# Patient Record
Sex: Female | Born: 1975
Health system: Southern US, Community
[De-identification: ages and names within clinical notes are randomized; demographics above are authoritative.]

## PROBLEM LIST (undated history)

## (undated) DIAGNOSIS — K5900 Constipation, unspecified: Secondary | ICD-10-CM

## (undated) DIAGNOSIS — R0602 Shortness of breath: Secondary | ICD-10-CM

## (undated) DIAGNOSIS — R053 Chronic cough: Secondary | ICD-10-CM

## (undated) DIAGNOSIS — R002 Palpitations: Secondary | ICD-10-CM

## (undated) DIAGNOSIS — R600 Localized edema: Secondary | ICD-10-CM

## (undated) DIAGNOSIS — I10 Essential (primary) hypertension: Secondary | ICD-10-CM

## (undated) DIAGNOSIS — M2550 Pain in unspecified joint: Secondary | ICD-10-CM

## (undated) DIAGNOSIS — E785 Hyperlipidemia, unspecified: Secondary | ICD-10-CM

## (undated) DIAGNOSIS — T7840XA Allergy, unspecified, initial encounter: Secondary | ICD-10-CM

## (undated) HISTORY — DX: Essential (primary) hypertension: I10

## (undated) HISTORY — PX: COLON SURGERY: SHX602

## (undated) HISTORY — DX: Allergy, unspecified, initial encounter: T78.40XA

## (undated) HISTORY — DX: Chronic cough: R05.3

## (undated) HISTORY — PX: WISDOM TOOTH EXTRACTION: SHX21

## (undated) HISTORY — DX: Pain in unspecified joint: M25.50

## (undated) HISTORY — PX: EYE SURGERY: SHX253

## (undated) HISTORY — DX: Palpitations: R00.2

## (undated) HISTORY — DX: Localized edema: R60.0

## (undated) HISTORY — DX: Constipation, unspecified: K59.00

## (undated) HISTORY — DX: Shortness of breath: R06.02

## (undated) HISTORY — DX: Hyperlipidemia, unspecified: E78.5

---

## 2008-11-17 DIAGNOSIS — G43909 Migraine, unspecified, not intractable, without status migrainosus: Secondary | ICD-10-CM | POA: Insufficient documentation

## 2012-07-31 DIAGNOSIS — K219 Gastro-esophageal reflux disease without esophagitis: Secondary | ICD-10-CM | POA: Insufficient documentation

## 2013-03-10 DIAGNOSIS — I10 Essential (primary) hypertension: Secondary | ICD-10-CM | POA: Insufficient documentation

## 2013-09-17 DIAGNOSIS — R454 Irritability and anger: Secondary | ICD-10-CM | POA: Insufficient documentation

## 2015-07-14 DIAGNOSIS — K219 Gastro-esophageal reflux disease without esophagitis: Secondary | ICD-10-CM | POA: Insufficient documentation

## 2015-07-14 DIAGNOSIS — Z8709 Personal history of other diseases of the respiratory system: Secondary | ICD-10-CM | POA: Insufficient documentation

## 2016-11-07 DIAGNOSIS — R928 Other abnormal and inconclusive findings on diagnostic imaging of breast: Secondary | ICD-10-CM | POA: Insufficient documentation

## 2018-06-24 DIAGNOSIS — H6123 Impacted cerumen, bilateral: Secondary | ICD-10-CM | POA: Diagnosis not present

## 2018-06-24 DIAGNOSIS — Z6831 Body mass index (BMI) 31.0-31.9, adult: Secondary | ICD-10-CM | POA: Diagnosis not present

## 2018-07-29 DIAGNOSIS — I10 Essential (primary) hypertension: Secondary | ICD-10-CM | POA: Diagnosis not present

## 2018-07-29 DIAGNOSIS — R454 Irritability and anger: Secondary | ICD-10-CM | POA: Diagnosis not present

## 2018-07-29 DIAGNOSIS — Z0000 Encounter for general adult medical examination without abnormal findings: Secondary | ICD-10-CM | POA: Diagnosis not present

## 2018-07-29 DIAGNOSIS — G43809 Other migraine, not intractable, without status migrainosus: Secondary | ICD-10-CM | POA: Diagnosis not present

## 2018-08-12 DIAGNOSIS — Z23 Encounter for immunization: Secondary | ICD-10-CM | POA: Diagnosis not present

## 2018-12-28 DIAGNOSIS — J019 Acute sinusitis, unspecified: Secondary | ICD-10-CM | POA: Diagnosis not present

## 2019-11-06 HISTORY — PX: LASIK: SHX215

## 2019-11-23 DIAGNOSIS — Z20822 Contact with and (suspected) exposure to covid-19: Secondary | ICD-10-CM | POA: Diagnosis not present

## 2019-12-19 DIAGNOSIS — Z20822 Contact with and (suspected) exposure to covid-19: Secondary | ICD-10-CM | POA: Diagnosis not present

## 2019-12-29 ENCOUNTER — Encounter: Payer: Self-pay | Admitting: Family Medicine

## 2019-12-29 ENCOUNTER — Other Ambulatory Visit: Payer: Self-pay

## 2019-12-29 ENCOUNTER — Ambulatory Visit (INDEPENDENT_AMBULATORY_CARE_PROVIDER_SITE_OTHER): Payer: BC Managed Care – PPO | Admitting: Family Medicine

## 2019-12-29 VITALS — BP 142/84 | HR 87 | Temp 98.6°F | Ht 64.0 in | Wt 185.4 lb

## 2019-12-29 DIAGNOSIS — G43829 Menstrual migraine, not intractable, without status migrainosus: Secondary | ICD-10-CM

## 2019-12-29 DIAGNOSIS — R454 Irritability and anger: Secondary | ICD-10-CM

## 2019-12-29 DIAGNOSIS — R05 Cough: Secondary | ICD-10-CM | POA: Diagnosis not present

## 2019-12-29 DIAGNOSIS — Z7689 Persons encountering health services in other specified circumstances: Secondary | ICD-10-CM

## 2019-12-29 DIAGNOSIS — R4586 Emotional lability: Secondary | ICD-10-CM

## 2019-12-29 DIAGNOSIS — Z8669 Personal history of other diseases of the nervous system and sense organs: Secondary | ICD-10-CM | POA: Insufficient documentation

## 2019-12-29 DIAGNOSIS — G8929 Other chronic pain: Secondary | ICD-10-CM | POA: Insufficient documentation

## 2019-12-29 DIAGNOSIS — R053 Chronic cough: Secondary | ICD-10-CM

## 2019-12-29 DIAGNOSIS — M2142 Flat foot [pes planus] (acquired), left foot: Secondary | ICD-10-CM

## 2019-12-29 DIAGNOSIS — Z1231 Encounter for screening mammogram for malignant neoplasm of breast: Secondary | ICD-10-CM

## 2019-12-29 DIAGNOSIS — I10 Essential (primary) hypertension: Secondary | ICD-10-CM

## 2019-12-29 DIAGNOSIS — R519 Headache, unspecified: Secondary | ICD-10-CM

## 2019-12-29 DIAGNOSIS — E669 Obesity, unspecified: Secondary | ICD-10-CM

## 2019-12-29 DIAGNOSIS — M2141 Flat foot [pes planus] (acquired), right foot: Secondary | ICD-10-CM | POA: Insufficient documentation

## 2019-12-29 DIAGNOSIS — R635 Abnormal weight gain: Secondary | ICD-10-CM | POA: Diagnosis not present

## 2019-12-29 DIAGNOSIS — M1990 Unspecified osteoarthritis, unspecified site: Secondary | ICD-10-CM | POA: Insufficient documentation

## 2019-12-29 MED ORDER — RIZATRIPTAN BENZOATE 10 MG PO TABS: 10.0000 mg | ORAL_TABLET | ORAL | 1 refills | Status: DC | PRN

## 2019-12-29 MED ORDER — LOSARTAN POTASSIUM 50 MG PO TABS: 50.0000 mg | ORAL_TABLET | Freq: Every day | ORAL | 1 refills | Status: DC

## 2019-12-29 MED ORDER — GABAPENTIN 300 MG PO CAPS: 300.0000 mg | ORAL_CAPSULE | ORAL | 1 refills | Status: DC | PRN

## 2019-12-29 MED ORDER — NORTRIPTYLINE HCL 10 MG PO CAPS: ORAL_CAPSULE | ORAL | 0 refills | Status: DC

## 2019-12-29 NOTE — Assessment & Plan Note (Signed)
Slightly elevated at today's visit but reports primarily stable and well controlled on current medication regimen.  Currently taking Losartan 50mg  daily and tolerating it well.   Plan: 1) Labs to be drawn today 2) Continue losartan 50mg  daily  3) Take blood pressure readings regularly and write in a log.  Bring that log to your next appointment. 4) Heart healthy diet and to exercise every other day for 30 minutes per day, going no more than 2 days in a row without exercise. 5) We will see you back in 6 months

## 2019-12-29 NOTE — Assessment & Plan Note (Signed)
Currently taking Celexa 20mg  daily.  Is interested in weaning down off of this medication.  Had started it a few years ago with some marital concerns but would like to see if she could reduce dosage and tolerate.  Discussed options for titration down, reviewed history of menstrual migraines and daily preventative therapy as well as Nortriptylline helping with anxiety/depression.  Will begin taper down of Celexa over the next month with concurrent taper up of Nortriptylline.  Will reassess in 4 weeks.

## 2019-12-29 NOTE — Assessment & Plan Note (Signed)
Currently taking Maxalt approx 2x a month and feels that headaches are well controlled, exacerbated with menses.

## 2019-12-29 NOTE — Progress Notes (Signed)
Subjective:    Patient ID: Katherine Foster, female    DOB: 08/03/1976, 44 y.o.   MRN: [id number]  Katherine Foster is a 44 y.o. female presenting on 12/29/2019 for [place]:  Weight/BMI: 3lb weight gain from last PE from 07/29/2018 Physical activity: Limited due to knee pain and bilateral flat feet Diet: Regular diet Seatbelt: 100% of the time Sunscreen: Yes when in the sun Mammogram: 12/24/2017 with abnormal result and had ultrasound done - Repeat Mammo ordered today PAP: 2011 - DUE - to return in 4 weeks for testing Skin exam: Follows with Dermatology Optometry: Yearly Dentistry: Every 6 months  IMMUNIZATIONS: Influenza: 08/12/2018 - DUE Tetanus: 08/12/2018 - COMPLETED COVID: Discussed   Depression screen [facility] 2/9 12/29/2019 12/29/2019  Decreased Interest 0 0  Down, Depressed, Hopeless 0 0  PHQ - 2 Score 0 0  Altered sleeping 0 -  Tired, decreased energy 1 -  Change in appetite 0 -  Feeling bad or failure about yourself  0 -  Trouble concentrating 0 -  Moving slowly or fidgety/restless 0 -  Suicidal thoughts 0 -  PHQ-9 Score 1 -  Difficult doing work/chores Not difficult at all -    History reviewed. No pertinent past medical history. Past Surgical History:  Procedure Laterality Date  . LASIK  11/06/2019  . WISDOM TOOTH EXTRACTION     Social History   Socioeconomic History  . Marital status: Married    Spouse name: Not on file  . Number of children: Not on file  . Years of education: Not on file  . Highest education level: Not on file  Occupational History  . Not on file  Tobacco Use  . Smoking status: Never Smoker  . Smokeless tobacco: Never Used  Substance and Sexual Activity  . Alcohol use: Not Currently  . Drug use: Never  . Sexual activity: Not on file  Other Topics Concern  . Not on file  Social History Narrative  . Not on file   Social Determinants of Health   Financial Resource Strain:   . Difficulty of Paying  Living Expenses: Not on file  Food Insecurity:   . Worried About Charity fundraiser in the Last Year: Not on file  . Ran Out of Food in the Last Year: Not on file  Transportation Needs:   . Lack of Transportation (Medical): Not on file  . Lack of Transportation (Non-Medical): Not on file  Physical Activity:   . Days of Exercise per Week: Not on file  . Minutes of Exercise per Session: Not on file  Stress:   . Feeling of Stress : Not on file  Social Connections:   . Frequency of Communication with Friends and Family: Not on file  . Frequency of Social Gatherings with Friends and Family: Not on file  . Attends Religious Services: Not on file  . Active Member of Clubs or Organizations: Not on file  . Attends Archivist Meetings: Not on file  . Marital Status: Not on file  Intimate Partner Violence:   . Fear of Current or Ex-Partner: Not on file  . Emotionally Abused: Not on file  . Physically Abused: Not on file  . Sexually Abused: Not on file   Family History  Problem Relation Age of Onset  . Diabetes Mellitus II Mother   . Hypothyroidism Mother   . Heart disease Father   . Heart attack Father   . Diabetes Mellitus II  Father   . Breast cancer Maternal Grandmother   . Cancer Maternal Grandmother   . Diabetes Maternal Grandfather   . Heart attack Paternal Grandfather    Current Outpatient Medications on File Prior to Visit  Medication Sig  . citalopram (CELEXA) 20 MG tablet Take 20 mg by mouth daily.  Marland Kitchen XIIDRA 5 % SOLN SMARTSIG:1 Drop(s) In Eye(s) Twice Daily   No current facility-administered medications on file prior to visit.    Per HPI unless specifically indicated above     Objective:    BP (!) 142/84 (BP Location: Right Arm, Patient Position: Sitting, Cuff Size: Normal)   Pulse 87   Temp 98.6 F (37 C) (Temporal)   Ht 5\' 4"  (1.626 m)   Wt 185 lb 6.4 oz (84.1 kg)   BMI 31.82 kg/m   Wt Readings from Last 3 Encounters:  12/29/19 185 lb 6.4 oz (84.1  kg)    Physical Exam Vitals reviewed.  Constitutional:      General: She is not in acute distress.    Appearance: Normal appearance. She is well-groomed. She is obese. She is not ill-appearing or toxic-appearing.  HENT:     Head: Normocephalic.  Eyes:     General: Lids are normal. Vision grossly intact.        Right eye: No discharge.        Left eye: No discharge.     Extraocular Movements: Extraocular movements intact.     Conjunctiva/sclera: Conjunctivae normal.     Pupils: Pupils are equal, round, and reactive to light.  Cardiovascular:     Rate and Rhythm: Normal rate and regular rhythm.     Pulses: Normal pulses.          Dorsalis pedis pulses are 2+ on the right side and 2+ on the left side.       Posterior tibial pulses are 2+ on the right side and 2+ on the left side.     Heart sounds: No murmur. No friction rub. No gallop.   Pulmonary:     Effort: Pulmonary effort is normal. No respiratory distress.     Breath sounds: Normal breath sounds.  Musculoskeletal:        General: Normal range of motion.     Cervical back: Normal range of motion.  Feet:     Right foot:     Skin integrity: Skin integrity normal.     Left foot:     Skin integrity: Skin integrity normal.  Skin:    General: Skin is warm and dry.     Capillary Refill: Capillary refill takes less than 2 seconds.  Neurological:     General: No focal deficit present.     Mental Status: She is alert and oriented to person, place, and time.     Cranial Nerves: Cranial nerves are intact.     Sensory: Sensation is intact.     Motor: Motor function is intact.     Coordination: Coordination is intact.     Gait: Gait is intact.  Psychiatric:        Attention and Perception: Attention and perception normal.        Mood and Affect: Mood and affect normal.        Speech: Speech normal.        Behavior: Behavior normal. Behavior is cooperative.        Thought Content: Thought content normal.        Cognition and  Memory: Cognition and memory  normal.        Judgment: Judgment normal.     No results found for this or any previous visit.    Assessment & Plan:   Problem List Items Addressed This Visit      Cardiovascular and Mediastinum   Hypertension    Slightly elevated at today's visit but reports primarily stable and well controlled on current medication regimen.  Currently taking Losartan 50mg  daily and tolerating it well.   Plan: 1) Labs to be drawn today 2) Continue losartan 50mg  daily  3) Take blood pressure readings regularly and write in a log.  Bring that log to your next appointment. 4) Heart healthy diet and to exercise every other day for 30 minutes per day, going no more than 2 days in a row without exercise. 5) We will see you back in 6 months      Relevant Medications   losartan (COZAAR) 50 MG tablet   Other Relevant Orders   CBC with Differential   COMPLETE METABOLIC PANEL WITH GFR   Migraine    Currently taking Maxalt approx 2x a month and feels that headaches are well controlled, exacerbated with menses.        Relevant Medications   citalopram (CELEXA) 20 MG tablet   gabapentin (NEURONTIN) 300 MG capsule   losartan (COZAAR) 50 MG tablet   rizatriptan (MAXALT) 10 MG tablet   nortriptyline (PAMELOR) 10 MG capsule     Other   Irritable mood    Currently taking Celexa 20mg  daily.  Is interested in weaning down off of this medication.  Had started it a few years ago with some marital concerns but would like to see if she could reduce dosage and tolerate.  Discussed options for titration down, reviewed history of menstrual migraines and daily preventative therapy as well as Nortriptylline helping with anxiety/depression.  Will begin taper down of Celexa over the next month with concurrent taper up of Nortriptylline.  Will reassess in 4 weeks.      Flat feet, bilateral   Establishing care with new doctor, encounter for    New patient establishment, transferring from [facility] 12/29/19.  No acute concerns today.    Plan: 1) Will have labs drawn and will call with the results.   2) Mammogram ordered and will be scheduled.   3) Medication tapers for Celexa down and Nortriptylline up and will reassess in 4 weeks 4) To return to clinic in 4 weeks for PAP testing 5) To call prior to 4 week appointment with any questions/concerns/needs.       Other Visit Diagnoses    Obesity (BMI 30-39.9)    -  Primary   Relevant Orders   Lipid Profile   Thyroid Panel With TSH   Chronic cough       Relevant Medications   gabapentin (NEURONTIN) 300 MG capsule   Encounter for screening mammogram for malignant neoplasm of breast       Relevant Orders   MM Digital Diagnostic Bilat   Nonintractable headache, unspecified chronicity pattern, unspecified headache type       Relevant Medications   citalopram (CELEXA) 20 MG tablet   gabapentin (NEURONTIN) 300 MG capsule   rizatriptan (MAXALT) 10 MG tablet   nortriptyline (PAMELOR) 10 MG capsule   Mood disturbance       Relevant Medications   nortriptyline (PAMELOR) 10 MG capsule      Meds ordered this encounter  Medications  . gabapentin (NEURONTIN) 300 MG capsule  Sig: Take 1 capsule (300 mg total) by mouth every three (3) days as needed (chronic cough).    Dispense:  90 capsule    Refill:  1  . losartan (COZAAR) 50 MG tablet    Sig: Take 1 tablet (50 mg total) by mouth daily.    Dispense:  90 tablet    Refill:  1  . rizatriptan (MAXALT) 10 MG tablet    Sig: Take 1 tablet (10 mg total) by mouth as needed for migraine. May repeat in 2 hours if needed    Dispense:  10 tablet    Refill:  1  . nortriptyline (PAMELOR) 10 MG capsule    Sig: Take 1 capsule (10 mg total) by mouth at bedtime for 7 days, THEN 2 capsules (20 mg total) at bedtime for 7 days, THEN 3 capsules (30 mg total) at bedtime for 21 days.    Dispense:  84 capsule    Refill:  0      Follow up plan: Return in about 4 weeks (around 01/26/2020) for  PAP, Physical, medication recheck depression.  Comprehensive dental screening reviewed (oral exam, prophylactic dental cleaning, and dental x-rays) once yearly in all adults.  May repeat every 6 months if clinically recommended.  Harlin Rain, FNP-C Family Nurse Practitioner [facility] 12/29/2019, 10:07 AM

## 2019-12-29 NOTE — Assessment & Plan Note (Signed)
New patient establishment, transferring from [facility] 12/29/19.  No acute concerns today.    Plan: 1) Will have labs drawn and will call with the results.   2) Mammogram ordered and will be scheduled.   3) Medication tapers for Celexa down and Nortriptylline up and will reassess in 4 weeks 4) To return to clinic in 4 weeks for PAP testing 5) To call prior to 4 week appointment with any questions/concerns/needs.

## 2019-12-29 NOTE — Patient Instructions (Addendum)
As we discussed, I have ordered your mammogram and lab work.  Have these completed in the next 1-2 weeks and I will contact you with the results.  We can begin to taper you down off of your Celexa 20mg  daily, going down to 10mg  daily for the next 2 weeks and then down to 5 mg for the following 2 weeks.   We can begin the prescription for Nortriptylline at bedtime this week.  You should take Nortriptylline 10mg  nightly for 1 week (7 nights), then increase to 20mg  nightly for 1 week (7 nights) and then increase to 30mg  nightly.  Keep in contact with me as you are tapering down off of the Celexa and onto the Nortriptylline to let me know about any side effects, questions or concerns so we can partner together for the right treatment plan for you.  Try to get exercise a minimum of 30 minutes per day at least 5 days per week as well as  adequate water intake all while measuring blood pressure a few times per week.  Keep a blood pressure log and bring back to clinic at your next visit.  If your readings are consistently over 140/90 to contact our office/send me a MyChart message and we will see you sooner.  Can try DASH and Mediterranean diet options, avoiding processed foods, lowering sodium intake, avoiding pork products, and eating a plant based diet for optimal health.  Keep a headache journal to identify triggers  Avoid Caffeine intake and using NSAIDs as these can increase "rebound headaches/migraines".  You will receive a survey after today's visit either digitally by e-mail or paper by [employer]. Your experiences and feedback matter to [place].  Please respond so we know how we are doing as we provide care for you.  Call us with any questions/concerns/needs.  It is my goal to be available to you for your health concerns.  Thanks for choosing me to be a partner in your healthcare needs!  Harlin Rain, FNP-C Family Nurse Practitioner [facility] Phone:  [phone]

## 2019-12-30 ENCOUNTER — Telehealth: Payer: Self-pay | Admitting: Family Medicine

## 2019-12-30 LAB — COMPLETE METABOLIC PANEL WITH GFR
AG Ratio: 1.6 (calc) (ref 1.0–2.5)
ALT: 29 U/L (ref 6–29)
AST: 13 U/L (ref 10–35)
Albumin: 4.2 g/dL (ref 3.6–5.1)
Alkaline phosphatase (APISO): 65 U/L (ref 31–125)
BUN: 13 mg/dL (ref 7–25)
CO2: 27 mmol/L (ref 20–32)
Calcium: 9.6 mg/dL (ref 8.6–10.2)
Chloride: 106 mmol/L (ref 98–110)
Creat: 0.84 mg/dL (ref 0.50–1.10)
GFR, Est African American: 97 mL/min/{1.73_m2} (ref >=60)
GFR, Est Non African American: 84 mL/min/{1.73_m2} (ref >=60)
Globulin: 2.7 g/dL (calc) (ref 1.9–3.7)
Glucose, Bld: 82 mg/dL (ref 65–99)
Potassium: 4.6 mmol/L (ref 3.5–5.3)
Sodium: 138 mmol/L (ref 135–146)
Total Bilirubin: 0.5 mg/dL (ref 0.2–1.2)
Total Protein: 6.9 g/dL (ref 6.1–8.1)

## 2019-12-30 LAB — CBC WITH DIFFERENTIAL/PLATELET
Absolute Monocytes: 663 cells/uL (ref 200–950)
Basophils Absolute: 75 cells/uL (ref 0–200)
Basophils Relative: 0.6 %
Eosinophils Absolute: 413 cells/uL (ref 15–500)
Eosinophils Relative: 3.3 %
HCT: 41.7 % (ref 35.0–45.0)
Hemoglobin: 14 g/dL (ref 11.7–15.5)
Lymphs Abs: 3350 cells/uL (ref 850–3900)
MCH: 28.9 pg (ref 27.0–33.0)
MCHC: 33.6 g/dL (ref 32.0–36.0)
MCV: 86 fL (ref 80.0–100.0)
MPV: 9.9 fL (ref 7.5–12.5)
Monocytes Relative: 5.3 %
Neutro Abs: 8000 cells/uL — ABNORMAL HIGH (ref 1500–7800)
Neutrophils Relative %: 64 %
Platelets: 288 10*3/uL (ref 140–400)
RBC: 4.85 10*6/uL (ref 3.80–5.10)
RDW: 12.7 % (ref 11.0–15.0)
Total Lymphocyte: 26.8 %
WBC: 12.5 10*3/uL — ABNORMAL HIGH (ref 3.8–10.8)

## 2019-12-30 LAB — THYROID PANEL WITH TSH
Free Thyroxine Index: 2.7 (ref 1.4–3.8)
T3 Uptake: 33 % (ref 22–35)
T4, Total: 8.2 ug/dL (ref 5.1–11.9)
TSH: 1.91 mIU/L

## 2019-12-30 LAB — LIPID PANEL
Cholesterol: 196 mg/dL (ref <200)
HDL: 36 mg/dL — ABNORMAL LOW (ref >=50)
LDL Cholesterol (Calc): 122 mg/dL (calc) — ABNORMAL HIGH
Non-HDL Cholesterol (Calc): 160 mg/dL (calc) — ABNORMAL HIGH (ref <130)
Total CHOL/HDL Ratio: 5.4 (calc) — ABNORMAL HIGH (ref <5.0)
Triglycerides: 238 mg/dL — ABNORMAL HIGH (ref <150)

## 2019-12-30 NOTE — Progress Notes (Signed)
ASCVD 10 year risk 1.9%.  To increase exercise and eating heart healthy foods.  Will repeat in 6 months.

## 2019-12-30 NOTE — Telephone Encounter (Signed)
Pt. Is requesting a call back. She have question about her print out that ws given to her yesterday. Pt  Call back at work is  [phone]

## 2019-12-31 NOTE — Telephone Encounter (Signed)
I attempted to contact the patient on the phone number she given, no answer. Left message for the patient to return my call.

## 2020-01-26 ENCOUNTER — Telehealth: Payer: Self-pay | Admitting: Family Medicine

## 2020-01-26 NOTE — Telephone Encounter (Signed)
Pt is requesting a call back she have questions about her medication ,  Mammogram pt  Work  # is  [phone]

## 2020-01-27 NOTE — Telephone Encounter (Signed)
The pt called with concerns about her recent medication change. She was recently tapered off of Celexa and put on Nortriptyline. She complains of feeling anxious, nervous and more emotional since the change . She also feel like she is worrying more about different things. She is requesting to go back on the Celexa.    She is also requesting a screening mammogram instead of diagnostic mammogram, because of the cost. I informed the patient that [facility] requires her previous scans. They will determine base off of the last radiology report if she is due a screening vs diagnostic mammogram. I gave the patient [place] phone number and advised her to contact them so she can sign the medical release for her images.

## 2020-01-28 ENCOUNTER — Other Ambulatory Visit: Payer: Self-pay | Admitting: Family Medicine

## 2020-01-28 DIAGNOSIS — R454 Irritability and anger: Secondary | ICD-10-CM

## 2020-01-28 MED ORDER — CITALOPRAM HYDROBROMIDE 20 MG PO TABS: 20.0000 mg | ORAL_TABLET | Freq: Every day | ORAL | 1 refills | Status: DC

## 2020-01-28 NOTE — Telephone Encounter (Signed)
Left message on the patient vm to return my call.

## 2020-01-28 NOTE — Telephone Encounter (Signed)
Can titrated down off of the Nortriptyline over a few weeks.  To take 10mg  less per day for 1 week, etc until fully off medication.  I have sent in the Celexa 20mg , she can restart today if she would like. Thanks

## 2020-01-29 NOTE — Telephone Encounter (Signed)
Left message for patient to call back  

## 2020-02-02 ENCOUNTER — Other Ambulatory Visit: Payer: Self-pay

## 2020-02-02 ENCOUNTER — Ambulatory Visit (INDEPENDENT_AMBULATORY_CARE_PROVIDER_SITE_OTHER): Payer: BC Managed Care – PPO | Admitting: Family Medicine

## 2020-02-02 ENCOUNTER — Encounter: Payer: Self-pay | Admitting: Family Medicine

## 2020-02-02 ENCOUNTER — Other Ambulatory Visit (HOSPITAL_COMMUNITY)
Admission: RE | Admit: 2020-02-02 | Discharge: 2020-02-02 | Disposition: A | Discharge status: Home / Self Care | Payer: BC Managed Care – PPO | Source: Ambulatory Visit | Attending: Family Medicine | Admitting: Family Medicine

## 2020-02-02 VITALS — BP 134/86 | HR 105 | Temp 97.3°F | Ht 64.0 in | Wt 192.4 lb

## 2020-02-02 DIAGNOSIS — G43009 Migraine without aura, not intractable, without status migrainosus: Secondary | ICD-10-CM

## 2020-02-02 DIAGNOSIS — R82998 Other abnormal findings in urine: Secondary | ICD-10-CM

## 2020-02-02 DIAGNOSIS — R454 Irritability and anger: Secondary | ICD-10-CM

## 2020-02-02 DIAGNOSIS — Z01419 Encounter for gynecological examination (general) (routine) without abnormal findings: Secondary | ICD-10-CM

## 2020-02-02 DIAGNOSIS — I10 Essential (primary) hypertension: Secondary | ICD-10-CM

## 2020-02-02 LAB — POCT URINALYSIS DIPSTICK
Bilirubin, UA: NEGATIVE
Glucose, UA: NEGATIVE
Ketones, UA: NEGATIVE
Nitrite, UA: NEGATIVE
Protein, UA: NEGATIVE
Spec Grav, UA: 1.02 (ref 1.010–1.025)
Urobilinogen, UA: 0.2 E.U./dL
pH, UA: 5 (ref 5.0–8.0)

## 2020-02-02 MED ORDER — BLOOD PRESSURE KIT: 1.0000 | PACK | Freq: Two times a day (BID) | 0 refills | Status: DC

## 2020-02-02 NOTE — Patient Instructions (Signed)
We have sent your pap to the lab for processing and will contact you once we receive the results.  Can continue to taper off the Nortriptyline over the next 3-4 days and can continue to increase your Celexa over the next 2 days to the full 20mg  per day.  If you are not having symptom relief with the 20mg  dose after a week or so, please let me know and we can discuss increasing the medication.  Your blood pressure has remained about the same from your visit 1 month ago and can be "elevated" due to the headache you have today.  I have given you a blood pressure log, write down your results 2x a day and we can meet virtually to discuss your blood pressure and Celexa via the telephone in 1-2 weeks.  The 7lb weight gain from last month to this month is likely related to the Nortriptyline and with weaning off of it, this weight should come off.  Continue to increase your physical activity, like playing in the park with your Daughter this past Sunday, and you may find that it takes additional weight off and lessens your anxiety/depression.  The following recommendations are helpful adjuncts for helping rebalance your mood.  Eat a nourishing diet. Ensure adequate intake of calories, protein, carbs, fat, vitamins, and minerals. Prioritize whole foods at each meal, including meats, vegetables, fruits, nuts and seeds, etc.   Avoid inflammatory and/or "junk" foods, such as sugar, omega-6 fats, refined grains, chemicals, and preservatives are common in packaged and prepared foods. Minimize or completely avoid these ingredients and stick to whole foods with little to no additives. Cook from scratch as much as possible for more control over what you eat  Get enough sleep. Poor sleep is significantly associated with depression and anxiety. Make 7-9 hours of sleep nightly a top priority  Exercise appropriately. Exercise is known to improve brain functioning and boost mood. Aim for 30 minutes of daily physical  activity. Avoid "overtraining," which can cause mental disturbances  Assess your light exposure. Not enough natural light during the day and too much artificial light can have a major impact on your mood. Get outside as often as possible during daylight hours. Minimize light exposure after dark and avoid the use of electronics that give off blue light before bed  Manage your stress.  Use daily stress management techniques such as meditation, yoga, or mindfulness to retrain your brain to respond differently to stress. Try deep breathing to deactivate your "fight or flight" response.  There are many of sources with apps like Headspace, Calm or a variety of YouTube videos (videos from Gwynne Edinger have guided meditation)  Prioritize your social life. Work on building social support with new friends or improve current relationships. Consider getting a pet that allows for companionship, social interaction, and physical touch. Try volunteering or joining a faith-based community to increase your sense of purpose  4-7-8 breathing technique at bedtime: breathe in to count of 4, hold breath for count of 7, exhale for count of 8; do 3-5 times for letting go of overactive thoughts  Take time to play Unstructured "play" time can help reduce anxiety and depression Options for play include music, games, sports, dance, art, etc.  Try to add daily omega 3 fatty acids, magnesium, B complex, and balanced amino acid supplements to help improve mood and anxiety.  You will receive a survey after today's visit either digitally by e-mail or paper by [employer]. Your experiences and feedback  matter to [place].  Please respond so we know how we are doing as we provide care for you.  Call us with any questions/concerns/needs.  It is my goal to be available to you for your health concerns.  Thanks for choosing me to be a partner in your healthcare needs!  Harlin Rain, FNP-C Family Nurse Practitioner [facility] Phone: [phone]

## 2020-02-02 NOTE — Assessment & Plan Note (Signed)
PAP testing completed in clinic today.  Plan: 1) PAP testing sent to the lab for processing.  Will contact once results are received.

## 2020-02-02 NOTE — Progress Notes (Signed)
Subjective:    Patient ID: Katherine Foster, female    DOB: Apr 04, 1976, 44 y.o.   MRN: [id number]  Katherine Foster is a 44 y.o. female presenting on 02/02/2020 for Weight Gain ( 7lb weight gain since last visit. ), Depression (feeling anxious, nervous and more emotional since the change from Citalopram to Nortripyline ), and Headache (x 8 hrs. The pt associate the headache w/ her menstrual cycle. )   HPI  [name] presents to clinic for follow up on her depression and headaches, is here for her PAP testing and concerns for 7lb weight gain since her last visit.  States she had titrated up on the Nortriptyline without relief of her symptoms of depression/headaches, found that it increased her anxiety coming off of her Celexa.  The Nortriptyline was giving her some dry mouth concerns which she attributes to her weight gain.  Has headaches that occur with her menses, her menses ended yesterday and believes that is why she is having a headache this morning.  Took 1/2 the dose of her Maxalt this morning and will take the other 1/2 dose after her clinic appointment today.  Has restarted her Celexa and has titrated up to 63m of this daily and has titrated down off of her Nortriptyline to 236mdaily as of today.  Has not been taking her blood pressure readings at home but believes her BP may be elevated due to her headache today.  Reports did go out on Sunday to the park with her daughter and played with a ball, felt her anxiety/depression was better after doing it.  [facility] 12/29/2019 12/29/2019  Decreased Interest 0 0  Down, Depressed, Hopeless 0 0  PHQ - 2 Score 0 0  Altered sleeping 0 -  Tired, decreased energy 1 -  Change in appetite 0 -  Feeling bad or failure about yourself  0 -  Trouble concentrating 0 -  Moving slowly or fidgety/restless 0 -  Suicidal thoughts 0 -  PHQ-9 Score 1 -  Difficult doing work/chores Not difficult at all -    Social History   Tobacco Use  . Smoking  status: Never Smoker  . Smokeless tobacco: Never Used  Substance Use Topics  . Alcohol use: Not Currently  . Drug use: Never    Review of Systems  Constitutional: Positive for unexpected weight change. Negative for activity change, appetite change, chills, diaphoresis, fatigue and fever.  HENT: Negative.   Eyes: Negative.   Respiratory: Negative.   Cardiovascular: Negative.   Gastrointestinal: Negative.   Endocrine: Negative.   Genitourinary: Negative.   Musculoskeletal: Negative.   Skin: Negative.   Allergic/Immunologic: Negative.   Neurological: Positive for headaches. Negative for dizziness, tremors, seizures, syncope, facial asymmetry, speech difficulty, weakness, light-headedness and numbness.  Hematological: Negative.   Psychiatric/Behavioral: Positive for dysphoric mood. Negative for agitation, behavioral problems, confusion, decreased concentration, hallucinations, self-injury, sleep disturbance and suicidal ideas. The patient is nervous/anxious. The patient is not hyperactive.    Per HPI unless specifically indicated above     Objective:    BP 134/86 (BP Location: Left Arm, Patient Position: Sitting, Cuff Size: Large)   Pulse (!) 105   Temp (!) 97.3 F (36.3 C) (Temporal)   Ht '5\' 4"'  (1.626 m)   Wt 192 lb 6.4 oz (87.3 kg)   BMI 33.03 kg/m   Wt Readings from Last 3 Encounters:  02/02/20 192 lb 6.4 oz (87.3 kg)  12/29/19 185 lb 6.4 oz (84.1 kg)  Physical Exam Vitals reviewed.  Constitutional:      General: She is not in acute distress.    Appearance: Normal appearance. She is well-developed and well-groomed. She is obese. She is not ill-appearing or toxic-appearing.  HENT:     Head: Normocephalic.  Eyes:     General: Lids are normal. Vision grossly intact. No scleral icterus.       Right eye: No discharge.        Left eye: No discharge.     Extraocular Movements: Extraocular movements intact.     Conjunctiva/sclera: Conjunctivae normal.     Pupils: Pupils  are equal, round, and reactive to light.  Cardiovascular:     Pulses: Normal pulses.          Dorsalis pedis pulses are 2+ on the right side and 2+ on the left side.       Posterior tibial pulses are 2+ on the right side and 2+ on the left side.  Pulmonary:     Effort: Pulmonary effort is normal.  Abdominal:     General: Abdomen is flat. Bowel sounds are normal.     Palpations: Abdomen is soft. There is no hepatomegaly or splenomegaly.     Tenderness: There is no abdominal tenderness.     Hernia: There is no hernia in the left inguinal area or right inguinal area.  Genitourinary:    General: Normal vulva.     Exam position: Lithotomy position.     Pubic Area: No rash or pubic lice.      Labia:        Right: No rash, tenderness, lesion or injury.        Left: No rash, tenderness, lesion or injury.      Urethra: No urethral pain, urethral swelling or urethral lesion.     Vagina: Normal. No signs of injury and foreign body. No vaginal discharge, erythema, tenderness, bleeding or lesions.     Cervix: No cervical motion tenderness, discharge, friability, lesion, erythema, cervical bleeding or eversion.     Uterus: Normal. Not enlarged and not tender.      Adnexa: Right adnexa normal and left adnexa normal.  Musculoskeletal:     Right lower leg: No edema.     Left lower leg: No edema.  Feet:     Right foot:     Skin integrity: Skin integrity normal.     Left foot:     Skin integrity: Skin integrity normal.  Lymphadenopathy:     Lower Body: No right inguinal adenopathy. No left inguinal adenopathy.  Skin:    General: Skin is warm and dry.     Capillary Refill: Capillary refill takes less than 2 seconds.  Neurological:     General: No focal deficit present.     Mental Status: She is alert and oriented to person, place, and time.     Cranial Nerves: No cranial nerve deficit, dysarthria or facial asymmetry.     Sensory: No sensory deficit.     Motor: No weakness.     Coordination:  Coordination normal.     Gait: Gait normal.  Psychiatric:        Attention and Perception: Attention and perception normal.        Mood and Affect: Mood and affect normal. Mood is not anxious or depressed.        Speech: Speech normal.        Behavior: Behavior normal. Behavior is not agitated. Behavior is cooperative.  Thought Content: Thought content normal.        Cognition and Memory: Cognition is not impaired. Memory is not impaired.        Judgment: Judgment normal.     Results for orders placed or performed in visit on 02/02/20  POCT Urinalysis Dipstick  Result Value Ref Range   Color, UA Yellow    Clarity, UA clear    Glucose, UA Negative Negative   Bilirubin, UA negative    Ketones, UA negatiive    Spec Grav, UA 1.020 1.010 - 1.025   Blood, UA small    pH, UA 5.0 5.0 - 8.0   Protein, UA Negative Negative   Urobilinogen, UA 0.2 0.2 or 1.0 E.U./dL   Nitrite, UA negative    Leukocytes, UA Small (1+) (A) Negative   Appearance     Odor        Assessment & Plan:   Problem List Items Addressed This Visit      Cardiovascular and Mediastinum   Hypertension - Primary    Stable and controlled hypertension with some elevation in clinic.  BP goal < 130/80.  Pt is working on lifestyle modifications.  Taking medications tolerating well without side effects. No known complications.  Will send home with BP log and will see back virtually in 1-2 weeks for virtual recheck of blood pressure to determine if additional medication may be needed.  Plan: 1. Continue taking losartan 43m daily 2. Obtain labs before your next scheduled appointment in 6 months  3. Encouraged heart healthy diet and increasing exercise to 30 minutes most days of the week, going no more than 2 days in a row without exercise. 4. Check BP 1-2 x per day at home, keep log, and bring to clinic at next appointment. 5. Follow up 1 week for BP follow up virtually.       Relevant Medications   Blood Pressure  KIT   Migraine    Stable and well controlled.  Has been taking Maxalt approx 2x per month, associated with menses.  Last migraine was this morning, had taken 1/2 maxalt (548m with some relief of symptoms and reports will take the additional 35m63mnce she returns home today.    Plan: 1) Continue Maxalt as needed 2) Discussed looking into a daily headache preventative, declined. 3) Follow up as needed        Other   Irritable mood    Had weaned off Celexa 20m59md tapered onto Nortriptyline with increase of anxiety/depression and 7lb weight gain.  Has self started to taper off of Nortriptyline and back onto the Celexa.  Is currently on 20mg108mNortriptyline and 35mg o56melexa.    Plan: 1) Tonight will take 10mg o10mrtriptyline and repeat x 2-3 nights before stopping completely. 2) Will take 10mg of59mexa tomorrow and increase to the 20mg of 235mxa on Thursday. 3) Will meet virutally in 1 week to determine response to Celexa 20mg and 4me need to increase medication (MDD = 40mg per d53m     Well woman exam with routine gynecological exam    PAP testing completed in clinic today.  Plan: 1) PAP testing sent to the lab for processing.  Will contact once results are received.      Relevant Orders   POCT Urinalysis Dipstick (Completed)   Cytology - PAP    Other Visit Diagnoses    Leukocytes in urine       Relevant Orders   Urine  Culture      Meds ordered this encounter  Medications  . Blood Pressure KIT    Sig: 1 each by Does not apply route 2 (two) times daily.    Dispense:  1 kit    Refill:  0    Please let patient know which machines may be covered under her insurance plan      Follow up plan: Return in about 1 week (around 02/09/2020) for BP & Med recheck virtually.   Harlin Rain, [facility] 02/02/2020, 8:47 AM

## 2020-02-02 NOTE — Assessment & Plan Note (Signed)
Stable and well controlled.  Has been taking Maxalt approx 2x per month, associated with menses.  Last migraine was this morning, had taken 1/2 maxalt (5mg ) with some relief of symptoms and reports will take the additional 5mg  once she returns home today.    Plan: 1) Continue Maxalt as needed 2) Discussed looking into a daily headache preventative, declined. 3) Follow up as needed

## 2020-02-02 NOTE — Assessment & Plan Note (Signed)
Had weaned off Celexa 20mg  and tapered onto Nortriptyline with increase of anxiety/depression and 7lb weight gain.  Has self started to taper off of Nortriptyline and back onto the Celexa.  Is currently on 20mg  of Nortriptyline and 5mg  of Celexa.    Plan: 1) Tonight will take 10mg  of Nortriptyline and repeat x 2-3 nights before stopping completely. 2) Will take 10mg  of Celexa tomorrow and increase to the 20mg  of Celexa on Thursday. 3) Will meet virutally in 1 week to determine response to Celexa 20mg  and if we need to increase medication (MDD = 40mg  per day)

## 2020-02-02 NOTE — Assessment & Plan Note (Signed)
Stable and controlled hypertension with some elevation in clinic.  BP goal < 130/80.  Pt is working on lifestyle modifications.  Taking medications tolerating well without side effects. No known complications.  Will send home with BP log and will see back virtually in 1-2 weeks for virtual recheck of blood pressure to determine if additional medication may be needed.  Plan: 1. Continue taking losartan 50mg  daily 2. Obtain labs before your next scheduled appointment in 6 months  3. Encouraged heart healthy diet and increasing exercise to 30 minutes most days of the week, going no more than 2 days in a row without exercise. 4. Check BP 1-2 x per day at home, keep log, and bring to clinic at next appointment. 5. Follow up 1 week for BP follow up virtually.

## 2020-02-03 LAB — URINE CULTURE
MICRO NUMBER:: 10333379
SPECIMEN QUALITY:: ADEQUATE

## 2020-02-04 NOTE — Progress Notes (Signed)
Urine we sent was negative.  Awaiting PAP testing.

## 2020-02-05 LAB — CYTOLOGY - PAP: Diagnosis: NEGATIVE

## 2020-02-05 NOTE — Progress Notes (Signed)
PAP negative.  Repeat in 3 years.  Thanks

## 2020-02-09 ENCOUNTER — Ambulatory Visit: Payer: BC Managed Care – PPO | Admitting: Family Medicine

## 2020-02-16 ENCOUNTER — Ambulatory Visit (INDEPENDENT_AMBULATORY_CARE_PROVIDER_SITE_OTHER): Payer: BC Managed Care – PPO | Admitting: Family Medicine

## 2020-02-16 ENCOUNTER — Other Ambulatory Visit: Payer: Self-pay

## 2020-02-16 ENCOUNTER — Encounter: Payer: Self-pay | Admitting: Family Medicine

## 2020-02-16 DIAGNOSIS — I10 Essential (primary) hypertension: Secondary | ICD-10-CM

## 2020-02-16 MED ORDER — LOSARTAN POTASSIUM 50 MG PO TABS: 75.0000 mg | ORAL_TABLET | Freq: Every day | ORAL | 0 refills | Status: DC

## 2020-02-16 NOTE — Assessment & Plan Note (Signed)
Uncontrolled hypertension.  BP is not at goal < 130/80.  Pt is working on lifestyle modifications.  Taking medications tolerating well without side effects. No known complications.  Plan: 1. INCREASE Losartan from 50mg  to 75mg  daily 2. Encouraged heart healthy diet and increasing exercise to 30 minutes most days of the week, going no more than 2 days in a row without exercise. 3. Check BP 1-2 x per day at home, keep log, and bring to clinic at next appointment. 4. Follow up 1-2 weeks.

## 2020-02-16 NOTE — Progress Notes (Signed)
02/11/20- PM 133/78, 74  02/12/20 - AM 135/91, 78  PM 149/79, 85  02/13/20- lunch 140/93, 76 PM 147/84, 85  02/14/20- PM 137/86, 90  02/15/20- AM 139/94, 86 PM 149/83, 77  02/16/20- AM 146/90, 74

## 2020-02-16 NOTE — Progress Notes (Signed)
Virtual Visit via Telephone  The purpose of this virtual visit is to provide medical care while limiting exposure to the novel coronavirus (COVID19) for both patient and office staff.  Consent was obtained for phone visit:  Yes.   Answered questions that patient had about telehealth interaction:  Yes.   I discussed the limitations, risks, security and privacy concerns of performing an evaluation and management service by telephone. I also discussed with the patient that there may be a patient responsible charge related to this service. The patient expressed understanding and agreed to proceed.  Patient is at home and is accessed via telephone Services are provided by Harlin Rain, FNP-C from [facility])  ---------------------------------------------------------------------- Chief Complaint  Patient presents with  . Hypertension    S: Reviewed CMA documentation. I have called patient and gathered additional HPI as follows:  Ms. Buhrman presents for telemedicine visit for follow up on her blood pressure.  Reports readings 133-149 SBP/74-90 DBP over the past week.  Denies dizziness, lightheadedness, visual changes, headaches, chest pain, numbness, tingling, weakness.  Patient is currently home Denies any high risk travel to areas of current concern for COVID19. Denies any known or suspected exposure to person with or possibly with COVID19.   History reviewed. No pertinent past medical history. Social History   Tobacco Use  . Smoking status: Never Smoker  . Smokeless tobacco: Never Used  Substance Use Topics  . Alcohol use: Not Currently  . Drug use: Never    Current Outpatient Medications:  .  Blood Pressure KIT, 1 each by Does not apply route 2 (two) times daily., Disp: 1 kit, Rfl: 0 .  citalopram (CELEXA) 20 MG tablet, Take 1 tablet (20 mg total) by mouth daily., Disp: 90 tablet, Rfl: 1 .  Elderberry 575 MG/5ML SYRP, Take by mouth., Disp: , Rfl:   .  gabapentin (NEURONTIN) 300 MG capsule, Take 1 capsule (300 mg total) by mouth every three (3) days as needed (chronic cough). (Patient taking differently: Take 300 mg by mouth 2 (two) times daily. ), Disp: 90 capsule, Rfl: 1 .  losartan (COZAAR) 50 MG tablet, Take 1.5 tablets (75 mg total) by mouth daily., Disp: 90 tablet, Rfl: 0 .  rizatriptan (MAXALT) 10 MG tablet, Take 1 tablet (10 mg total) by mouth as needed for migraine. May repeat in 2 hours if needed, Disp: 10 tablet, Rfl: 1 .  XIIDRA 5 % SOLN, SMARTSIG:1 Drop(s) In Eye(s) Twice Daily, Disp: , Rfl:   Depression screen [facility] 2/9 12/29/2019 12/29/2019  Decreased Interest 0 0  Down, Depressed, Hopeless 0 0  PHQ - 2 Score 0 0  Altered sleeping 0 -  Tired, decreased energy 1 -  Change in appetite 0 -  Feeling bad or failure about yourself  0 -  Trouble concentrating 0 -  Moving slowly or fidgety/restless 0 -  Suicidal thoughts 0 -  PHQ-9 Score 1 -  Difficult doing work/chores Not difficult at all -    GAD 7 : Generalized Anxiety Score 12/29/2019  Nervous, Anxious, on Edge 0  Control/stop worrying 0  Worry too much - different things 0  Trouble relaxing 0  Restless 0  Easily annoyed or irritable 0  Afraid - awful might happen 0  Total GAD 7 Score 0  Anxiety Difficulty Not difficult at all    -------------------------------------------------------------------------- O: No physical exam performed due to remote telephone encounter.  Physical Exam: Patient remotely monitored without video.  Verbal communication appropriate.  Cognition normal.   Recent Results (from the past 2160 hour(s))  CBC with Differential     Status: Abnormal   Collection Time: 12/29/19 10:31 AM  Result Value Ref Range   WBC 12.5 (H) 3.8 - 10.8 Thousand/uL   RBC 4.85 3.80 - 5.10 Million/uL   Hemoglobin 14.0 11.7 - 15.5 g/dL   HCT 41.7 35.0 - 45.0 %   MCV 86.0 80.0 - 100.0 fL   MCH 28.9 27.0 - 33.0 pg   MCHC 33.6 32.0 - 36.0 g/dL   RDW 12.7 11.0 -  15.0 %   Platelets 288 140 - 400 Thousand/uL   MPV 9.9 7.5 - 12.5 fL   Neutro Abs 8,000 (H) 1,500 - 7,800 cells/uL   Lymphs Abs 3,350 850 - 3,900 cells/uL   Absolute Monocytes 663 200 - 950 cells/uL   Eosinophils Absolute 413 15 - 500 cells/uL   Basophils Absolute 75 0 - 200 cells/uL   Neutrophils Relative % 64 %   Total Lymphocyte 26.8 %   Monocytes Relative 5.3 %   Eosinophils Relative 3.3 %   Basophils Relative 0.6 %  COMPLETE METABOLIC PANEL WITH GFR     Status: None   Collection Time: 12/29/19 10:31 AM  Result Value Ref Range   Glucose, Bld 82 65 - 99 mg/dL    Comment: .            Fasting reference interval .    BUN 13 7 - 25 mg/dL   Creat 0.84 0.50 - 1.10 mg/dL   GFR, Est Non African American 84 > OR = 60 mL/min/1.54m   GFR, Est African American 97 > OR = 60 mL/min/1.726m  BUN/Creatinine Ratio NOT APPLICABLE 6 - 22 (calc)   Sodium 138 135 - 146 mmol/L   Potassium 4.6 3.5 - 5.3 mmol/L   Chloride 106 98 - 110 mmol/L   CO2 27 20 - 32 mmol/L   Calcium 9.6 8.6 - 10.2 mg/dL   Total Protein 6.9 6.1 - 8.1 g/dL   Albumin 4.2 3.6 - 5.1 g/dL   Globulin 2.7 1.9 - 3.7 g/dL (calc)   AG Ratio 1.6 1.0 - 2.5 (calc)   Total Bilirubin 0.5 0.2 - 1.2 mg/dL   Alkaline phosphatase (APISO) 65 31 - 125 U/L   AST 13 10 - 35 U/L   ALT 29 6 - 29 U/L  Lipid Profile     Status: Abnormal   Collection Time: 12/29/19 10:31 AM  Result Value Ref Range   Cholesterol 196 <200 mg/dL   HDL 36 (L) > OR = 50 mg/dL   Triglycerides 238 (H) <150 mg/dL    Comment: . If a non-fasting specimen was collected, consider repeat triglyceride testing on a fasting specimen if clinically indicated.  JaYates Decampt al. J. of Clin. Lipidol. [id number];9:983-382. Marland Kitchen  LDL Cholesterol (Calc) 122 (H) mg/dL (calc)    Comment: Reference range: <100 . Desirable range <100 mg/dL for primary prevention;   <70 mg/dL for patients with CHD or diabetic patients  with > or = 2 CHD risk factors. . Marland KitchenDL-C is now calculated using  the Martin-Hopkins  calculation, which is a validated novel method providing  better accuracy than the Friedewald equation in the  estimation of LDL-C.  MaCresenciano Genret al. JAAnnamaria Helling205053;976([id number]  (http://education.[facility].com/faq/FAQ164)    Total CHOL/HDL Ratio 5.4 (H) <5.0 (calc)   Non-HDL Cholesterol (Calc) 160 (H) <130 mg/dL (calc)    Comment: For patients with diabetes plus 1 major  ASCVD risk  factor, treating to a non-HDL-C goal of <100 mg/dL  (LDL-C of <70 mg/dL) is considered a therapeutic  option.   Thyroid Panel With TSH     Status: None   Collection Time: 12/29/19 10:31 AM  Result Value Ref Range   T3 Uptake 33 22 - 35 %   T4, Total 8.2 5.1 - 11.9 mcg/dL   Free Thyroxine Index 2.7 1.4 - 3.8   TSH 1.91 mIU/L    Comment:           Reference Range .           > or = 20 Years  0.40-4.50 .                Pregnancy Ranges           First trimester    0.26-2.66           Second trimester   0.55-2.73           Third trimester    0.43-2.91   POCT Urinalysis Dipstick     Status: Abnormal   Collection Time: 02/02/20  8:56 AM  Result Value Ref Range   Color, UA Yellow    Clarity, UA clear    Glucose, UA Negative Negative   Bilirubin, UA negative    Ketones, UA negatiive    Spec Grav, UA 1.020 1.010 - 1.025   Blood, UA small    pH, UA 5.0 5.0 - 8.0   Protein, UA Negative Negative   Urobilinogen, UA 0.2 0.2 or 1.0 E.U./dL   Nitrite, UA negative    Leukocytes, UA Small (1+) (A) Negative   Appearance     Odor    Cytology - PAP     Status: None   Collection Time: 02/02/20  9:09 AM  Result Value Ref Range   Adequacy      Satisfactory for evaluation; transformation zone component PRESENT.   Diagnosis      - Negative for intraepithelial lesion or malignancy (NILM)  Urine Culture     Status: None   Collection Time: 02/02/20  9:44 AM   Specimen: Urine  Result Value Ref Range   MICRO NUMBER: [id number]    SPECIMEN QUALITY: Adequate    Sample Source URINE     STATUS: FINAL    ISOLATE 1:      Growth of mixed flora was isolated, suggesting probable contamination. No further testing will be performed. If clinically indicated, recollection using a method to minimize contamination, with prompt transfer to Urine Culture Transport Tube, is  recommended.     -------------------------------------------------------------------------- A&P:  Problem List Items Addressed This Visit      Cardiovascular and Mediastinum   Hypertension    Uncontrolled hypertension.  BP is not at goal < 130/80.  Pt is working on lifestyle modifications.  Taking medications tolerating well without side effects. No known complications.  Plan: 1. INCREASE Losartan from 47m to 717mdaily 2. Encouraged heart healthy diet and increasing exercise to 30 minutes most days of the week, going no more than 2 days in a row without exercise. 3. Check BP 1-2 x per day at home, keep log, and bring to clinic at next appointment. 4. Follow up 1-2 weeks.         Relevant Medications   losartan (COZAAR) 50 MG tablet      Meds ordered this encounter  Medications  . losartan (COZAAR) 50 MG tablet    Sig: Take 1.5 tablets (  75 mg total) by mouth daily.    Dispense:  90 tablet    Refill:  0    Follow-up: - Return in 1 week for blood pressure follow up  Patient verbalizes understanding with the above medical recommendations including the limitation of remote medical advice.  Specific follow-up and call-back criteria were given for patient to follow-up or seek medical care more urgently if needed.   - Time spent in direct consultation with patient on phone: 5 minutes  Harlin Rain, [facility] 02/16/2020, 8:20 AM

## 2020-02-16 NOTE — Patient Instructions (Signed)
As we discussed, increase your Losartan from 50mg  daily to 75mg  daily.  I have sent in a new prescription to your pharmacy.  Be sure to change positions slowly over the next few days, as while your blood pressure is lowering you may have some dizziness/feeling lightheaded.  Be sure to increase your water intake.  Try to get exercise a minimum of 30 minutes per day at least 5 days per week as well as  adequate water intake all while measuring blood pressure a few times per week.  Keep a blood pressure log and bring back to clinic at your next visit.  If your readings are consistently over 140/90 to contact our office/send me a MyChart message and we will see you sooner.  Can try DASH and Mediterranean diet options, avoiding processed foods, lowering sodium intake, avoiding pork products, and eating a plant based diet for optimal health.      Mediterranean Diet  Why follow it? Research shows. . Those who follow the Mediterranean diet have a reduced risk of heart disease  . The diet is associated with a reduced incidence of Parkinson's and Alzheimer's diseases . People following the diet may have longer life expectancies and lower rates of chronic diseases  . The Dietary Guidelines for Americans recommends the Mediterranean diet as an eating plan to promote health and prevent disease  What Is the Mediterranean Diet?  . Healthy eating plan based on typical foods and recipes of Mediterranean-style cooking . The diet is primarily a plant based diet; these foods should make up a majority of meals   Starches - Plant based foods should make up a majority of meals - They are an important sources of vitamins, minerals, energy, antioxidants, and fiber - Choose whole grains, foods high in fiber and minimally processed items  - Typical grain sources include wheat, oats, barley, corn, brown rice, bulgar, farro, millet, polenta, couscous  - Various types of beans include chickpeas, lentils, fava beans, black  beans, white beans   Fruits  Veggies - Large quantities of antioxidant rich fruits & veggies; 6 or more servings  - Vegetables can be eaten raw or lightly drizzled with oil and cooked  - Vegetables common to the traditional Mediterranean Diet include: artichokes, arugula, beets, broccoli, brussel sprouts, cabbage, carrots, celery, collard greens, cucumbers, eggplant, kale, leeks, lemons, lettuce, mushrooms, okra, onions, peas, peppers, potatoes, pumpkin, radishes, rutabaga, shallots, spinach, sweet potatoes, turnips, zucchini - Fruits common to the Mediterranean Diet include: apples, apricots, avocados, cherries, clementines, dates, figs, grapefruits, grapes, melons, nectarines, oranges, peaches, pears, pomegranates, strawberries, tangerines  Fats - Replace butter and margarine with healthy oils, such as olive oil, canola oil, and tahini  - Limit nuts to no more than a handful a day  - Nuts include walnuts, almonds, pecans, pistachios, pine nuts  - Limit or avoid candied, honey roasted or heavily salted nuts - Olives are central to the [employer] - can be eaten whole or used in a variety of dishes   Meats Protein - Limiting red meat: no more than a few times a month - When eating red meat: choose lean cuts and keep the portion to the size of deck of cards - Eggs: approx. 0 to 4 times a week  - Fish and lean poultry: at least 2 a week  - Healthy protein sources include, chicken, [place], lean beef, lamb - Increase intake of seafood such as tuna, salmon, trout, mackerel, shrimp, scallops - Avoid or limit high fat processed  meats such as sausage and bacon  Dairy - Include moderate amounts of low fat dairy products  - Focus on healthy dairy such as fat free yogurt, skim milk, low or reduced fat cheese - Limit dairy products higher in fat such as whole or 2% milk, cheese, ice cream  Alcohol - Moderate amounts of red wine is ok  - No more than 5 oz daily for women (all ages) and men older than  age 34  - No more than 10 oz of wine daily for men younger than 74  Other - Limit sweets and other desserts  - Use herbs and spices instead of salt to flavor foods  - Herbs and spices common to the traditional Mediterranean Diet include: basil, bay leaves, chives, cloves, cumin, fennel, garlic, lavender, marjoram, mint, oregano, parsley, pepper, rosemary, sage, savory, sumac, tarragon, thyme   It's not just a diet, it's a lifestyle:  . The Mediterranean diet includes lifestyle factors typical of those in the region  . Foods, drinks and meals are best eaten with others and savored . Daily physical activity is important for overall good health . This could be strenuous exercise like running and aerobics . This could also be more leisurely activities such as walking, housework, yard-work, or taking the stairs . Moderation is the key; a balanced and healthy diet accommodates most foods and drinks . Consider portion sizes and frequency of consumption of certain foods   Meal Ideas & Options:  . Breakfast:  o Whole wheat toast or whole wheat English muffins with peanut butter & hard boiled egg o Steel cut oats topped with apples & cinnamon and skim milk  o Fresh fruit: banana, strawberries, melon, berries, peaches  o Smoothies: strawberries, bananas, greek yogurt, peanut butter o Low fat greek yogurt with blueberries and granola  o Egg white omelet with spinach and mushrooms o Breakfast couscous: whole wheat couscous, apricots, skim milk, cranberries  . Sandwiches:  o Hummus and grilled vegetables (peppers, zucchini, squash) on whole wheat bread   o Grilled chicken on whole wheat pita with lettuce, tomatoes, cucumbers or tzatziki  o Tuna salad on whole wheat bread: tuna salad made with greek yogurt, olives, red peppers, capers, green onions o Garlic rosemary lamb pita: lamb sauted with garlic, rosemary, salt & pepper; add lettuce, cucumber, greek yogurt to pita - flavor with lemon juice and  black pepper  . Seafood:  o Mediterranean grilled salmon, seasoned with garlic, basil, parsley, lemon juice and black pepper o Shrimp, lemon, and spinach whole-grain pasta salad made with low fat greek yogurt  o Seared scallops with lemon orzo  o Seared tuna steaks seasoned salt, pepper, coriander topped with tomato mixture of olives, tomatoes, olive oil, minced garlic, parsley, green onions and cappers  . Meats:  o Herbed greek chicken salad with kalamata olives, cucumber, feta  o Red bell peppers stuffed with spinach, bulgur, lean ground beef (or lentils) & topped with feta   o Kebabs: skewers of chicken, tomatoes, onions, zucchini, squash  o [place] burgers: made with red onions, mint, dill, lemon juice, feta cheese topped with roasted red peppers . Vegetarian o Cucumber salad: cucumbers, artichoke hearts, celery, red onion, feta cheese, tossed in olive oil & lemon juice  o Hummus and whole grain pita points with a greek salad (lettuce, tomato, feta, olives, cucumbers, red onion) o Lentil soup with celery, carrots made with vegetable broth, garlic, salt and pepper  o Tabouli salad: parsley, bulgur, mint, scallions, cucumbers, tomato,  radishes, lemon juice, olive oil, salt and pepper.  We will plan to see you back in 1-2 weeks for hypertension follow up  You will receive a survey after today's visit either digitally by e-mail or paper by [place] mail. Your experiences and feedback matter to [place].  Please respond so we know how we are doing as we provide care for you.  Call us with any questions/concerns/needs.  It is my goal to be available to you for your health concerns.  Thanks for choosing me to be a partner in your healthcare needs!  Harlin Rain, FNP-C Family Nurse Practitioner [facility] Phone: [phone]

## 2020-02-18 ENCOUNTER — Other Ambulatory Visit: Payer: Self-pay | Admitting: Family Medicine

## 2020-02-18 DIAGNOSIS — I10 Essential (primary) hypertension: Secondary | ICD-10-CM

## 2020-02-23 ENCOUNTER — Encounter: Payer: Self-pay | Admitting: Family Medicine

## 2020-02-24 ENCOUNTER — Other Ambulatory Visit: Payer: Self-pay | Admitting: Family Medicine

## 2020-02-24 DIAGNOSIS — R053 Chronic cough: Secondary | ICD-10-CM

## 2020-02-24 DIAGNOSIS — R05 Cough: Secondary | ICD-10-CM

## 2020-02-24 MED ORDER — GABAPENTIN 300 MG PO CAPS: 300.0000 mg | ORAL_CAPSULE | Freq: Three times a day (TID) | ORAL | 0 refills | Status: DC

## 2020-02-24 NOTE — Progress Notes (Signed)
Gabapentin rx updated.

## 2020-03-02 ENCOUNTER — Encounter: Payer: Self-pay | Admitting: Family Medicine

## 2020-03-02 ENCOUNTER — Other Ambulatory Visit: Payer: Self-pay | Admitting: Family Medicine

## 2020-03-02 ENCOUNTER — Other Ambulatory Visit: Payer: Self-pay

## 2020-03-02 ENCOUNTER — Telehealth (INDEPENDENT_AMBULATORY_CARE_PROVIDER_SITE_OTHER): Payer: BC Managed Care – PPO | Admitting: Family Medicine

## 2020-03-02 DIAGNOSIS — I10 Essential (primary) hypertension: Secondary | ICD-10-CM | POA: Diagnosis not present

## 2020-03-02 MED ORDER — LOSARTAN POTASSIUM 100 MG PO TABS: 100.0000 mg | ORAL_TABLET | Freq: Every day | ORAL | 0 refills | Status: DC

## 2020-03-02 NOTE — Progress Notes (Signed)
Virtual Visit via Telephone  The purpose of this virtual visit is to provide medical care while limiting exposure to the novel coronavirus (COVID19) for both patient and office staff.  Consent was obtained for phone visit:  Yes.   Answered questions that patient had about telehealth interaction:  Yes.   I discussed the limitations, risks, security and privacy concerns of performing an evaluation and management service by telephone. I also discussed with the patient that there may be a patient responsible charge related to this service. The patient expressed understanding and agreed to proceed.  Patient is at home and is accessed via telephone Services are provided by Harlin Rain, FNP-C from [facility])  ---------------------------------------------------------------------- Chief Complaint  Patient presents with  . Hypertension    highest 147/89- lowest 122/76, normal averaging 134/84     S: Reviewed CMA documentation. I have called patient and gathered additional HPI as follows:  Katherine Foster presents for telemedicine visit, has been taking her blood pressure at home and has been having readings in the 130's-140's/80's.  Reports she takes her blood pressure twice daily, once first thing in the morning, then takes her losartan and then rechecks her blood pressures in the evening.  Denies headaches, visual changes, dizziness, lightheadedness, CP, abdominal pain, n/v/d.  Patient is currently working  Denies any high risk travel to areas of current concern for [place]. Denies any known or suspected exposure to person with or possibly with COVID19.  History reviewed. No pertinent past medical history. Social History   Tobacco Use  . Smoking status: Never Smoker  . Smokeless tobacco: Never Used  Substance Use Topics  . Alcohol use: Not Currently  . Drug use: Never    Current Outpatient Medications:  .  citalopram (CELEXA) 20 MG tablet, Take 1 tablet (20  mg total) by mouth daily., Disp: 90 tablet, Rfl: 1 .  gabapentin (NEURONTIN) 300 MG capsule, Take 1 capsule (300 mg total) by mouth 3 (three) times daily. (Patient taking differently: Take 300 mg by mouth 2 (two) times daily. ), Disp: 270 capsule, Rfl: 0 .  rizatriptan (MAXALT) 10 MG tablet, Take 1 tablet (10 mg total) by mouth as needed for migraine. May repeat in 2 hours if needed (Patient taking differently: Take 5 mg by mouth as needed for migraine. May repeat in 2 hours if needed ), Disp: 10 tablet, Rfl: 1 .  Elderberry 575 MG/5ML SYRP, Take by mouth., Disp: , Rfl:  .  losartan (COZAAR) 100 MG tablet, Take 1 tablet (100 mg total) by mouth daily., Disp: 30 tablet, Rfl: 0  Depression screen [facility] 2/9 12/29/2019 12/29/2019  Decreased Interest 0 0  Down, Depressed, Hopeless 0 0  PHQ - 2 Score 0 0  Altered sleeping 0 -  Tired, decreased energy 1 -  Change in appetite 0 -  Feeling bad or failure about yourself  0 -  Trouble concentrating 0 -  Moving slowly or fidgety/restless 0 -  Suicidal thoughts 0 -  PHQ-9 Score 1 -  Difficult doing work/chores Not difficult at all -    GAD 7 : Generalized Anxiety Score 12/29/2019  Nervous, Anxious, on Edge 0  Control/stop worrying 0  Worry too much - different things 0  Trouble relaxing 0  Restless 0  Easily annoyed or irritable 0  Afraid - awful might happen 0  Total GAD 7 Score 0  Anxiety Difficulty Not difficult at all    -------------------------------------------------------------------------- O: No physical exam performed due to  remote telephone encounter.  Physical Exam: Patient remotely monitored without video.  Verbal communication appropriate.  Cognition normal.   Recent Results (from the past 2160 hour(s))  CBC with Differential     Status: Abnormal   Collection Time: 12/29/19 10:31 AM  Result Value Ref Range   WBC 12.5 (H) 3.8 - 10.8 Thousand/uL   RBC 4.85 3.80 - 5.10 Million/uL   Hemoglobin 14.0 11.7 - 15.5 g/dL   HCT 41.7 35.0  - 45.0 %   MCV 86.0 80.0 - 100.0 fL   MCH 28.9 27.0 - 33.0 pg   MCHC 33.6 32.0 - 36.0 g/dL   RDW 12.7 11.0 - 15.0 %   Platelets 288 140 - 400 Thousand/uL   MPV 9.9 7.5 - 12.5 fL   Neutro Abs 8,000 (H) 1,500 - 7,800 cells/uL   Lymphs Abs 3,350 850 - 3,900 cells/uL   Absolute Monocytes 663 200 - 950 cells/uL   Eosinophils Absolute 413 15 - 500 cells/uL   Basophils Absolute 75 0 - 200 cells/uL   Neutrophils Relative % 64 %   Total Lymphocyte 26.8 %   Monocytes Relative 5.3 %   Eosinophils Relative 3.3 %   Basophils Relative 0.6 %  COMPLETE METABOLIC PANEL WITH GFR     Status: None   Collection Time: 12/29/19 10:31 AM  Result Value Ref Range   Glucose, Bld 82 65 - 99 mg/dL    Comment: .            Fasting reference interval .    BUN 13 7 - 25 mg/dL   Creat 0.84 0.50 - 1.10 mg/dL   GFR, Est Non African American 84 > OR = 60 mL/min/1.82m2   GFR, Est African American 97 > OR = 60 mL/min/1.20m2   BUN/Creatinine Ratio NOT APPLICABLE 6 - 22 (calc)   Sodium 138 135 - 146 mmol/L   Potassium 4.6 3.5 - 5.3 mmol/L   Chloride 106 98 - 110 mmol/L   CO2 27 20 - 32 mmol/L   Calcium 9.6 8.6 - 10.2 mg/dL   Total Protein 6.9 6.1 - 8.1 g/dL   Albumin 4.2 3.6 - 5.1 g/dL   Globulin 2.7 1.9 - 3.7 g/dL (calc)   AG Ratio 1.6 1.0 - 2.5 (calc)   Total Bilirubin 0.5 0.2 - 1.2 mg/dL   Alkaline phosphatase (APISO) 65 31 - 125 U/L   AST 13 10 - 35 U/L   ALT 29 6 - 29 U/L  Lipid Profile     Status: Abnormal   Collection Time: 12/29/19 10:31 AM  Result Value Ref Range   Cholesterol 196 <200 mg/dL   HDL 36 (L) > OR = 50 mg/dL   Triglycerides 238 (H) <150 mg/dL    Comment: . If a non-fasting specimen was collected, consider repeat triglyceride testing on a fasting specimen if clinically indicated.  Yates Decamp et al. J. of Clin. Lipidol. [id number]. Marland Kitchen    LDL Cholesterol (Calc) 122 (H) mg/dL (calc)    Comment: Reference range: <100 . Desirable range <100 mg/dL for primary prevention;   <70  mg/dL for patients with CHD or diabetic patients  with > or = 2 CHD risk factors. Marland Kitchen LDL-C is now calculated using the Martin-Hopkins  calculation, which is a validated novel method providing  better accuracy than the Friedewald equation in the  estimation of LDL-C.  Cresenciano Genre et al. Annamaria Helling. [id number]): 2061-2068  (http://education.[facility].com/faq/FAQ164)    Total CHOL/HDL Ratio 5.4 (H) <5.0 (calc)   Non-HDL Cholesterol (Calc)  160 (H) <130 mg/dL (calc)    Comment: For patients with diabetes plus 1 major ASCVD risk  factor, treating to a non-HDL-C goal of <100 mg/dL  (LDL-C of <70 mg/dL) is considered a therapeutic  option.   Thyroid Panel With TSH     Status: None   Collection Time: 12/29/19 10:31 AM  Result Value Ref Range   T3 Uptake 33 22 - 35 %   T4, Total 8.2 5.1 - 11.9 mcg/dL   Free Thyroxine Index 2.7 1.4 - 3.8   TSH 1.91 mIU/L    Comment:           Reference Range .           > or = 20 Years  0.40-4.50 .                Pregnancy Ranges           First trimester    0.26-2.66           Second trimester   0.55-2.73           Third trimester    0.43-2.91   POCT Urinalysis Dipstick     Status: Abnormal   Collection Time: 02/02/20  8:56 AM  Result Value Ref Range   Color, UA Yellow    Clarity, UA clear    Glucose, UA Negative Negative   Bilirubin, UA negative    Ketones, UA negatiive    Spec Grav, UA 1.020 1.010 - 1.025   Blood, UA small    pH, UA 5.0 5.0 - 8.0   Protein, UA Negative Negative   Urobilinogen, UA 0.2 0.2 or 1.0 E.U./dL   Nitrite, UA negative    Leukocytes, UA Small (1+) (A) Negative   Appearance     Odor    Cytology - PAP     Status: None   Collection Time: 02/02/20  9:09 AM  Result Value Ref Range   Adequacy      Satisfactory for evaluation; transformation zone component PRESENT.   Diagnosis      - Negative for intraepithelial lesion or malignancy (NILM)  Urine Culture     Status: None   Collection Time: 02/02/20  9:44 AM    Specimen: Urine  Result Value Ref Range   MICRO NUMBER: [id number]    SPECIMEN QUALITY: Adequate    Sample Source URINE    STATUS: FINAL    ISOLATE 1:      Growth of mixed flora was isolated, suggesting probable contamination. No further testing will be performed. If clinically indicated, recollection using a method to minimize contamination, with prompt transfer to Urine Culture Transport Tube, is  recommended.     -------------------------------------------------------------------------- A&P:  Problem List Items Addressed This Visit      Cardiovascular and Mediastinum   Hypertension    Better control of hypertension from last visit to this visit but still elevated hypertension.  BP is not at goal < 130/80.  Pt is working on lifestyle modifications.  Taking medications tolerating well without side effects. No known complications.  Plan: 1. INCREASE Losartan from 75mg  daily to 100mg  daily 2. Encouraged heart healthy diet and increasing exercise to 30 minutes most days of the week, going no more than 2 days in a row without exercise. 3. Check BP 1-2 x per day at home, keep log, and bring to clinic at next appointment. 4. Follow up 2 weeks.         Relevant Medications   losartan (  COZAAR) 100 MG tablet      Meds ordered this encounter  Medications  . losartan (COZAAR) 100 MG tablet    Sig: Take 1 tablet (100 mg total) by mouth daily.    Dispense:  30 tablet    Refill:  0    Follow-up: - Return in 2 weeks for hypertension follow up  Patient verbalizes understanding with the above medical recommendations including the limitation of remote medical advice.  Specific follow-up and call-back criteria were given for patient to follow-up or seek medical care more urgently if needed.   - Time spent in direct consultation with patient on phone: 9 minutes  Harlin Rain, [facility] 03/02/2020, 8:11 AM

## 2020-03-02 NOTE — Telephone Encounter (Signed)
Requested Prescriptions  Pending Prescriptions Disp Refills  . losartan (COZAAR) 100 MG tablet [Pharmacy Med Name: LOSARTAN 100MG  TABLETS] 90 tablet     Sig: TAKE 1 TABLET(100 MG) BY MOUTH DAILY     Cardiovascular:  Angiotensin Receptor Blockers Failed - 03/02/2020  8:18 AM      Failed - Last BP in normal range    BP Readings from Last 1 Encounters:  03/02/20 (!) 147/89         Passed - Cr in normal range and within 180 days    Creat  Date Value Ref Range Status  12/29/2019 0.84 0.50 - 1.10 mg/dL Final         Passed - K in normal range and within 180 days    Potassium  Date Value Ref Range Status  12/29/2019 4.6 3.5 - 5.3 mmol/L Final         Passed - Patient is not pregnant      Passed - Valid encounter within last 6 months    Recent Outpatient Visits          Today Hypertension, unspecified type   [place], FNP   2 weeks ago Hypertension, unspecified type   [place], FNP   4 weeks ago Hypertension, unspecified type   [facility], Lupita Raider, FNP   2 months ago Obesity (BMI 30-39.9)   [facility], Lupita Raider, FNP      Future Appointments            In 2 weeks [place], Lupita Raider, [facility], [facility]

## 2020-03-02 NOTE — Assessment & Plan Note (Signed)
Better control of hypertension from last visit to this visit but still elevated hypertension.  BP is not at goal < 130/80.  Pt is working on lifestyle modifications.  Taking medications tolerating well without side effects. No known complications.  Plan: 1. INCREASE Losartan from 75mg  daily to 100mg  daily 2. Encouraged heart healthy diet and increasing exercise to 30 minutes most days of the week, going no more than 2 days in a row without exercise. 3. Check BP 1-2 x per day at home, keep log, and bring to clinic at next appointment. 4. Follow up 2 weeks.

## 2020-03-03 ENCOUNTER — Other Ambulatory Visit: Payer: Self-pay | Admitting: Family Medicine

## 2020-03-03 DIAGNOSIS — N6310 Unspecified lump in the right breast, unspecified quadrant: Secondary | ICD-10-CM

## 2020-03-03 DIAGNOSIS — R928 Other abnormal and inconclusive findings on diagnostic imaging of breast: Secondary | ICD-10-CM

## 2020-03-03 DIAGNOSIS — Z1231 Encounter for screening mammogram for malignant neoplasm of breast: Secondary | ICD-10-CM

## 2020-03-11 ENCOUNTER — Telehealth: Payer: Self-pay | Admitting: Family Medicine

## 2020-03-11 NOTE — Telephone Encounter (Signed)
Patient is calling regarding a bill on 02/02/20 patient had CPE with a Pap. Patient was billed $33.48. Patient was under the impression that this was part of her annual through her insurance. Please advise CB- [phone]

## 2020-03-15 ENCOUNTER — Encounter: Payer: Self-pay | Admitting: Family Medicine

## 2020-03-16 ENCOUNTER — Telehealth: Payer: BC Managed Care – PPO | Admitting: Family Medicine

## 2020-03-16 ENCOUNTER — Other Ambulatory Visit: Payer: Self-pay | Admitting: Family Medicine

## 2020-03-16 DIAGNOSIS — I10 Essential (primary) hypertension: Secondary | ICD-10-CM

## 2020-03-16 MED ORDER — LOSARTAN POTASSIUM 100 MG PO TABS: ORAL_TABLET | ORAL | 1 refills | Status: DC

## 2020-03-16 NOTE — Progress Notes (Signed)
Losartan refill sent to pharmacy on file

## 2020-03-16 NOTE — Telephone Encounter (Signed)
Spoke with patient and explained to her that during her physical she also discussed multiple other issues.  That is why she has a $30 copay.  The other $3.48 was the urine simple and she would need to contact her insurance to see why they did not cover in full.

## 2020-03-31 ENCOUNTER — Ambulatory Visit
Admission: RE | Admit: 2020-03-31 | Discharge: 2020-03-31 | Disposition: A | Discharge status: Home / Self Care | Payer: BC Managed Care – PPO | Source: Ambulatory Visit | Attending: Family Medicine | Admitting: Family Medicine

## 2020-03-31 ENCOUNTER — Other Ambulatory Visit: Payer: Self-pay | Admitting: Family Medicine

## 2020-03-31 DIAGNOSIS — R928 Other abnormal and inconclusive findings on diagnostic imaging of breast: Secondary | ICD-10-CM

## 2020-03-31 DIAGNOSIS — N6310 Unspecified lump in the right breast, unspecified quadrant: Secondary | ICD-10-CM

## 2020-03-31 DIAGNOSIS — Z1231 Encounter for screening mammogram for malignant neoplasm of breast: Secondary | ICD-10-CM

## 2020-03-31 DIAGNOSIS — R922 Inconclusive mammogram: Secondary | ICD-10-CM | POA: Diagnosis not present

## 2020-03-31 DIAGNOSIS — N6314 Unspecified lump in the right breast, lower inner quadrant: Secondary | ICD-10-CM | POA: Diagnosis not present

## 2020-04-05 ENCOUNTER — Ambulatory Visit
Admission: RE | Admit: 2020-04-05 | Discharge: 2020-04-05 | Disposition: A | Discharge status: Home / Self Care | Payer: BC Managed Care – PPO | Source: Ambulatory Visit | Attending: Family Medicine | Admitting: Family Medicine

## 2020-04-05 ENCOUNTER — Ambulatory Visit (INDEPENDENT_AMBULATORY_CARE_PROVIDER_SITE_OTHER): Payer: BC Managed Care – PPO | Admitting: Family Medicine

## 2020-04-05 ENCOUNTER — Other Ambulatory Visit: Payer: Self-pay

## 2020-04-05 ENCOUNTER — Encounter: Payer: Self-pay | Admitting: Family Medicine

## 2020-04-05 VITALS — BP 155/67 | HR 70 | Temp 97.1°F | Ht 64.0 in | Wt 189.6 lb

## 2020-04-05 DIAGNOSIS — L0291 Cutaneous abscess, unspecified: Secondary | ICD-10-CM

## 2020-04-05 DIAGNOSIS — N6313 Unspecified lump in the right breast, lower outer quadrant: Secondary | ICD-10-CM | POA: Diagnosis not present

## 2020-04-05 DIAGNOSIS — N6081 Other benign mammary dysplasias of right breast: Secondary | ICD-10-CM | POA: Diagnosis not present

## 2020-04-05 DIAGNOSIS — N6310 Unspecified lump in the right breast, unspecified quadrant: Secondary | ICD-10-CM

## 2020-04-05 DIAGNOSIS — R928 Other abnormal and inconclusive findings on diagnostic imaging of breast: Secondary | ICD-10-CM | POA: Insufficient documentation

## 2020-04-05 DIAGNOSIS — N764 Abscess of vulva: Secondary | ICD-10-CM

## 2020-04-05 HISTORY — PX: BREAST BIOPSY: SHX20

## 2020-04-05 MED ORDER — CEPHALEXIN 500 MG PO CAPS: 500.0000 mg | ORAL_CAPSULE | Freq: Four times a day (QID) | ORAL | 0 refills | Status: AC

## 2020-04-05 NOTE — Assessment & Plan Note (Signed)
Right labial cyst that had actively started to drain last evening, with small amount of purulent drainage expressed in clinic.  Has history of 2 bartholian cysts in the past and is leaving on vacation this weekend to go to the beach.  Discussed home care of soaking in hot tub with epsom salt and expressing drainage.  Rx sent in for Cephalexin 500mg  to take 4x per day for 5 days if she has worsening of symptoms, as she will be out of town.  Plan: 1. Home treatment of soaking in warm tub with epsom salt and milking gland for release of drainage. 2. Can take ibuprofen and/or acetaminophen as needed for discomfort 3. Rx for cephalexin 500mg  to take 4x per day for 5 days sent to pharmacy on file, if symptoms worsen, as patient is leaving on vacation in a few days.

## 2020-04-05 NOTE — Progress Notes (Signed)
Subjective:    Patient ID: Katherine Foster, female    DOB: 1976-05-15, 45 y.o.   MRN: [id number]  Katherine Foster is a 44 y.o. female presenting on 04/05/2020 for Bartholin's Cyst (Pt state she has a history of bartholin cyst. She had a total of 3 cyst. She said she notice that it was starting to come up 2 weeks ago, but it look as if it started to improve. 3 days ago she said it got inflamed again, but it drained on it own x 1 day ago. )   HPI  Katherine Foster presents to clinic for evaluation of vaginal abscess that has been present approximately 2 weeks, believes it has started to improve up until 3 days ago when it got inflamed again.  Reports it started to open and drain on its own yesterday.  Has a history of bartholian cysts and does shave her pubic hair.  Denies any fevers, streaking from abscess site, difficulty sitting down, dysuria, urinary frequency/urgency/hesitancy or feeling of incomplete emptying.    Depression screen [facility] 2/9 12/29/2019 12/29/2019  Decreased Interest 0 0  Down, Depressed, Hopeless 0 0  PHQ - 2 Score 0 0  Altered sleeping 0 -  Tired, decreased energy 1 -  Change in appetite 0 -  Feeling bad or failure about yourself  0 -  Trouble concentrating 0 -  Moving slowly or fidgety/restless 0 -  Suicidal thoughts 0 -  PHQ-9 Score 1 -  Difficult doing work/chores Not difficult at all -    Social History   Tobacco Use  . Smoking status: Never Smoker  . Smokeless tobacco: Never Used  Substance Use Topics  . Alcohol use: Not Currently  . Drug use: Never    Review of Systems  Constitutional: Negative.   HENT: Negative.   Eyes: Negative.   Respiratory: Negative.   Cardiovascular: Negative.   Gastrointestinal: Negative.   Endocrine: Negative.   Genitourinary: Negative.        Labial cyst  Musculoskeletal: Negative.   Skin: Negative.   Allergic/Immunologic: Negative.   Neurological: Negative.   Hematological: Negative.   Psychiatric/Behavioral: Negative.    Per  HPI unless specifically indicated above     Objective:    BP (!) 155/67 (BP Location: Right Arm, Patient Position: Sitting, Cuff Size: Normal)   Pulse 70   Temp (!) 97.1 F (36.2 C) (Temporal)   Ht 5\' 4"  (1.626 m)   Wt 189 lb 9.6 oz (86 kg)   LMP 03/17/2020   BMI 32.54 kg/m   Wt Readings from Last 3 Encounters:  04/05/20 189 lb 9.6 oz (86 kg)  02/02/20 192 lb 6.4 oz (87.3 kg)  12/29/19 185 lb 6.4 oz (84.1 kg)    Physical Exam Vitals reviewed.  Constitutional:      General: She is not in acute distress.    Appearance: Normal appearance. She is well-developed and well-groomed. She is obese. She is not ill-appearing or toxic-appearing.  HENT:     Head: Normocephalic.     Nose:     Comments: Katherine Foster is in place, covering mouth and nose  Eyes:     General: Lids are normal. Vision grossly intact.        Right eye: No discharge.        Left eye: No discharge.     Extraocular Movements: Extraocular movements intact.     Conjunctiva/sclera: Conjunctivae normal.     Pupils: Pupils are equal, round, and reactive to light.  Cardiovascular:  Pulses: Normal pulses.          Dorsalis pedis pulses are 2+ on the right side and 2+ on the left side.       Posterior tibial pulses are 2+ on the right side and 2+ on the left side.  Pulmonary:     Effort: Pulmonary effort is normal.  Abdominal:     Palpations: There is no hepatomegaly or splenomegaly.     Hernia: There is no hernia in the left inguinal area or right inguinal area.  Genitourinary:    General: Normal vulva.     Exam position: Lithotomy position.     Pubic Area: No rash or pubic lice.      Labia:        Right: Tenderness present. No injury.        Left: No rash, tenderness, lesion or injury.      Urethra: No urethral pain, urethral swelling or urethral lesion.    Musculoskeletal:     Right lower leg: No edema.     Left lower leg: No edema.  Feet:     Right foot:     Skin integrity: Skin integrity normal.      Left foot:     Skin integrity: Skin integrity normal.  Lymphadenopathy:     Lower Body: No right inguinal adenopathy. No left inguinal adenopathy.  Skin:    General: Skin is warm and dry.     Capillary Refill: Capillary refill takes less than 2 seconds.  Neurological:     General: No focal deficit present.     Mental Status: She is alert and oriented to person, place, and time.     Cranial Nerves: No cranial nerve deficit.     Sensory: No sensory deficit.     Motor: No weakness.     Coordination: Coordination normal.     Gait: Gait normal.  Psychiatric:        Attention and Perception: Attention and perception normal.        Mood and Affect: Mood and affect normal.        Speech: Speech normal.        Behavior: Behavior normal. Behavior is cooperative.        Thought Content: Thought content normal.        Judgment: Judgment normal.    Results for orders placed or performed in visit on 02/02/20  Urine Culture   Specimen: Urine  Result Value Ref Range   MICRO NUMBER: [id number]    SPECIMEN QUALITY: Adequate    Sample Source URINE    STATUS: FINAL    ISOLATE 1:      Growth of mixed flora was isolated, suggesting probable contamination. No further testing will be performed. If clinically indicated, recollection using a method to minimize contamination, with prompt transfer to Urine Culture Transport Tube, is  recommended.   POCT Urinalysis Dipstick  Result Value Ref Range   Color, UA Yellow    Clarity, UA clear    Glucose, UA Negative Negative   Bilirubin, UA negative    Ketones, UA negatiive    Spec Grav, UA 1.020 1.010 - 1.025   Blood, UA small    pH, UA 5.0 5.0 - 8.0   Protein, UA Negative Negative   Urobilinogen, UA 0.2 0.2 or 1.0 E.U./dL   Nitrite, UA negative    Leukocytes, UA Small (1+) (A) Negative   Appearance     Odor    Cytology - PAP  Result  Value Ref Range   Adequacy      Satisfactory for evaluation; transformation zone component PRESENT.   Diagnosis        - Negative for intraepithelial lesion or malignancy (NILM)      Assessment & Plan:   Problem List Items Addressed This Visit      Genitourinary   Labial abscess    Right labial cyst that had actively started to drain last evening, with small amount of purulent drainage expressed in clinic.  Has history of 2 bartholian cysts in the past and is leaving on vacation this weekend to go to the beach.  Discussed home care of soaking in hot tub with epsom salt and expressing drainage.  Rx sent in for Cephalexin 500mg  to take 4x per day for 5 days if she has worsening of symptoms, as she will be out of town.  Plan: 1. Home treatment of soaking in warm tub with epsom salt and milking gland for release of drainage. 2. Can take ibuprofen and/or acetaminophen as needed for discomfort 3. Rx for cephalexin 500mg  to take 4x per day for 5 days sent to pharmacy on file, if symptoms worsen, as patient is leaving on vacation in a few days.       Other Visit Diagnoses    Abscess    -  Primary   Relevant Medications   cephALEXin (KEFLEX) 500 MG capsule      Meds ordered this encounter  Medications  . cephALEXin (KEFLEX) 500 MG capsule    Sig: Take 1 capsule (500 mg total) by mouth 4 (four) times daily for 5 days.    Dispense:  20 capsule    Refill:  0      Follow up plan: Return if symptoms worsen or fail to improve.   Harlin Rain, [place] [facility] Nurse Practitioner [facility] 04/05/2020, 12:31 PM

## 2020-04-05 NOTE — Patient Instructions (Signed)
As we discussed, you have an small blocked gland of the lower right labia.  I have sent in a prescription for cephalexin to take 1 tablet 4x per day for the next 5 days, if you symptoms worsen with you going on vacation.  Can take a bath with hot water (as hot as you can handle) with epsom salt and milk the gland for alleviation of pressure.  We will see you back as needed for this.  You will receive a survey after today's visit either digitally by e-mail or paper by [employer]. Your experiences and feedback matter to [place].  Please respond so we know how we are doing as we provide care for you.  Call us with any questions/concerns/needs.  It is my goal to be available to you for your health concerns.  Thanks for choosing me to be a partner in your healthcare needs!  Harlin Rain, FNP-C Family Nurse Practitioner [facility] Phone: [phone]

## 2020-04-06 LAB — SURGICAL PATHOLOGY

## 2020-05-17 DIAGNOSIS — R0981 Nasal congestion: Secondary | ICD-10-CM | POA: Diagnosis not present

## 2020-05-17 DIAGNOSIS — Z20822 Contact with and (suspected) exposure to covid-19: Secondary | ICD-10-CM | POA: Diagnosis not present

## 2020-07-05 DIAGNOSIS — N390 Urinary tract infection, site not specified: Secondary | ICD-10-CM | POA: Diagnosis not present

## 2020-07-05 DIAGNOSIS — R300 Dysuria: Secondary | ICD-10-CM | POA: Diagnosis not present

## 2020-08-19 ENCOUNTER — Other Ambulatory Visit: Payer: Self-pay | Admitting: Family Medicine

## 2020-08-19 DIAGNOSIS — R454 Irritability and anger: Secondary | ICD-10-CM

## 2020-08-19 NOTE — Telephone Encounter (Signed)
Requested Prescriptions  Pending Prescriptions Disp Refills  . citalopram (CELEXA) 20 MG tablet [Pharmacy Med Name: CITALOPRAM 20MG  TABLETS] 90 tablet 1    Sig: TAKE 1 TABLET(20 MG) BY MOUTH DAILY     Psychiatry:  Antidepressants - SSRI Passed - 08/19/2020  7:32 AM      Passed - Valid encounter within last 6 months    Recent Outpatient Visits          4 months ago Abscess   [facility] [place], FNP   5 months ago Hypertension, unspecified type   [place], FNP   6 months ago Hypertension, unspecified type   [place], FNP   6 months ago Hypertension, unspecified type   [place], FNP   7 months ago Obesity (BMI 30-39.9)   [facility], Lupita Raider, [place]

## 2020-12-15 ENCOUNTER — Other Ambulatory Visit: Payer: Self-pay

## 2020-12-15 DIAGNOSIS — R454 Irritability and anger: Secondary | ICD-10-CM

## 2020-12-15 MED ORDER — CITALOPRAM HYDROBROMIDE 20 MG PO TABS: 20.0000 mg | ORAL_TABLET | Freq: Every day | ORAL | 0 refills | Status: DC

## 2020-12-15 NOTE — Telephone Encounter (Signed)
The pt was told to f/u in a week to address anxiety x 62mths ago. No f/u appt concerning her anxiety since starting back on the Citalopram.

## 2020-12-15 NOTE — Telephone Encounter (Signed)
Will send 30 day rx so she can schedule apt.  Nobie Putnam, [facility] 12/15/2020, 3:34 PM

## 2021-01-20 ENCOUNTER — Other Ambulatory Visit: Payer: Self-pay | Admitting: Family Medicine

## 2021-01-20 DIAGNOSIS — I10 Essential (primary) hypertension: Secondary | ICD-10-CM

## 2021-01-20 DIAGNOSIS — G43829 Menstrual migraine, not intractable, without status migrainosus: Secondary | ICD-10-CM

## 2021-01-20 DIAGNOSIS — R454 Irritability and anger: Secondary | ICD-10-CM

## 2021-01-20 NOTE — Telephone Encounter (Signed)
Please see refill request for Losartan. Message received when reordering medication that it was not on the preferred formulary on the patient's insurance plan and that other alternatives may be more affordable.

## 2021-01-20 NOTE — Telephone Encounter (Signed)
Medication Refill - Medication: Losartan 100 mg,  Citalopram 20 mg, Rizatriptan  Has the patient contacted their pharmacy? No.  pt said no refills (Agent: If no, request that the patient contact the pharmacy for the refill.) (Agent: If yes, when and what did the pharmacy advise?)  Preferred Pharmacy (with phone number or street name): [facility]  Agent: Please be advised that RX refills may take up to 3 business days. We ask that you follow-up with your pharmacy.

## 2021-01-20 NOTE — Telephone Encounter (Signed)
Requested medication (s) are due for refill today: Yes  Requested medication (s) are on the active medication list: Yes  Last refill:  12/15/20 and 12/29/19  Future visit scheduled: No  Notes to clinic:  Unable to refill per protocol, appointment needed, expired Rx     Requested Prescriptions  Pending Prescriptions Disp Refills   citalopram (CELEXA) 20 MG tablet 30 tablet 0    Sig: Take 1 tablet (20 mg total) by mouth daily. Need apt within 30 days to follow-up anxiety      Psychiatry:  Antidepressants - SSRI Failed - 01/20/2021  3:46 PM      Failed - Valid encounter within last 6 months    Recent Outpatient Visits           9 months ago Abscess   [facility], Lupita Raider, FNP   10 months ago Hypertension, unspecified type   [place], FNP   11 months ago Hypertension, unspecified type   [place], FNP   11 months ago Hypertension, unspecified type   [place], FNP   1 year ago Obesity (BMI 30-39.9)   [facility], Lupita Raider, FNP                  rizatriptan (MAXALT) 10 MG tablet 10 tablet 1    Sig: Take 1 tablet (10 mg total) by mouth as needed for migraine. May repeat in 2 hours if needed      Neurology:  Migraine Therapy - Triptan Failed - 01/20/2021  3:46 PM      Failed - Last BP in normal range    BP Readings from Last 1 Encounters:  04/05/20 (!) 155/67          Passed - Valid encounter within last 12 months    Recent Outpatient Visits           9 months ago Abscess   [facility], Lupita Raider, FNP   10 months ago Hypertension, unspecified type   [place], FNP   11 months ago Hypertension, unspecified type   [place], FNP   11 months ago Hypertension, unspecified type   [place], FNP   1 year  ago Obesity (BMI 30-39.9)   [facility], Lupita Raider, [place]

## 2021-01-20 NOTE — Telephone Encounter (Signed)
Patient called, left VM to return the call to the office to schedule an OV for follow up.  ? ?

## 2021-01-23 MED ORDER — CITALOPRAM HYDROBROMIDE 20 MG PO TABS: 20.0000 mg | ORAL_TABLET | Freq: Every day | ORAL | 0 refills | Status: DC

## 2021-01-23 MED ORDER — RIZATRIPTAN BENZOATE 10 MG PO TABS: 10.0000 mg | ORAL_TABLET | ORAL | 1 refills | Status: DC | PRN

## 2021-01-26 ENCOUNTER — Other Ambulatory Visit: Payer: Self-pay

## 2021-01-26 DIAGNOSIS — I10 Essential (primary) hypertension: Secondary | ICD-10-CM

## 2021-01-26 MED ORDER — LOSARTAN POTASSIUM 100 MG PO TABS: ORAL_TABLET | ORAL | 0 refills | Status: DC

## 2021-01-26 NOTE — Telephone Encounter (Signed)
   Future visit scheduled: yes  Notes to clinic:  Patient has appointment on 01/31/2021 and would like enough medication until appointment She took last pill yesterday  Protocol failed for valid encounter within last 6 months  Requested Prescriptions  Pending Prescriptions Disp Refills   losartan (COZAAR) 100 MG tablet 90 tablet 1    Sig: TAKE 1 TABLET(100 MG) BY MOUTH DAILY      Cardiovascular:  Angiotensin Receptor Blockers Failed - 01/26/2021  3:15 PM      Failed - Cr in normal range and within 180 days    Creat  Date Value Ref Range Status  12/29/2019 0.84 0.50 - 1.10 mg/dL Final          Failed - K in normal range and within 180 days    Potassium  Date Value Ref Range Status  12/29/2019 4.6 3.5 - 5.3 mmol/L Final          Failed - Last BP in normal range    BP Readings from Last 1 Encounters:  04/05/20 (!) 155/67          Failed - Valid encounter within last 6 months    Recent Outpatient Visits           9 months ago Abscess   [facility], Lupita Raider, FNP   11 months ago Hypertension, unspecified type   [facility], Lupita Raider, FNP   11 months ago Hypertension, unspecified type   [facility], Lupita Raider, FNP   11 months ago Hypertension, unspecified type   [facility], Lupita Raider, FNP   1 year ago Obesity (BMI 30-39.9)   [facility], Lupita Raider, FNP       Future Appointments             In 5 days Kathrine Haddock, NP [facility] [place] - Patient is not pregnant

## 2021-01-31 ENCOUNTER — Encounter: Payer: Self-pay | Admitting: Unknown Physician Specialty

## 2021-01-31 ENCOUNTER — Ambulatory Visit: Payer: BC Managed Care – PPO | Admitting: Unknown Physician Specialty

## 2021-01-31 ENCOUNTER — Other Ambulatory Visit: Payer: Self-pay

## 2021-01-31 VITALS — BP 146/81 | HR 69 | Temp 98.6°F | Resp 18 | Ht 64.0 in | Wt 188.2 lb

## 2021-01-31 DIAGNOSIS — G43009 Migraine without aura, not intractable, without status migrainosus: Secondary | ICD-10-CM

## 2021-01-31 DIAGNOSIS — Z0000 Encounter for general adult medical examination without abnormal findings: Secondary | ICD-10-CM

## 2021-01-31 DIAGNOSIS — I10 Essential (primary) hypertension: Secondary | ICD-10-CM | POA: Diagnosis not present

## 2021-01-31 DIAGNOSIS — R053 Chronic cough: Secondary | ICD-10-CM

## 2021-01-31 DIAGNOSIS — E669 Obesity, unspecified: Secondary | ICD-10-CM

## 2021-01-31 DIAGNOSIS — Z1231 Encounter for screening mammogram for malignant neoplasm of breast: Secondary | ICD-10-CM

## 2021-01-31 DIAGNOSIS — R454 Irritability and anger: Secondary | ICD-10-CM

## 2021-01-31 NOTE — Patient Instructions (Addendum)
Please do call to schedule your mammogram; the number to schedule one at either [facility] or [facility] is [phone]   Health Maintenance, Female Adopting a healthy lifestyle and getting preventive care are important in promoting health and wellness. Ask your health care provider about:  The right schedule for you to have regular tests and exams.  Things you can do on your own to prevent diseases and keep yourself healthy. What should I know about diet, weight, and exercise? Eat a healthy diet  Eat a diet that includes plenty of vegetables, fruits, low-fat dairy products, and lean protein.  Do not eat a lot of foods that are high in solid fats, added sugars, or sodium.   Maintain a healthy weight Body mass index (BMI) is used to identify weight problems. It estimates body fat based on height and weight. Your health care provider can help determine your BMI and help you achieve or maintain a healthy weight. Get regular exercise Get regular exercise. This is one of the most important things you can do for your health. Most adults should:  Exercise for at least 150 minutes each week. The exercise should increase your heart rate and make you sweat (moderate-intensity exercise).  Do strengthening exercises at least twice a week. This is in addition to the moderate-intensity exercise.  Spend less time sitting. Even light physical activity can be beneficial. Watch cholesterol and blood lipids Have your blood tested for lipids and cholesterol at 47 years of age, then have this test every 5 years. Have your cholesterol levels checked more often if:  Your lipid or cholesterol levels are high.  You are older than 47 years of age.  You are at high risk for heart disease. What should I know about cancer screening? Depending on your health history and family history, you may need to have cancer screening at various ages. This may include screening  for:  Breast cancer.  Cervical cancer.  Colorectal cancer.  Skin cancer.  Lung cancer. What should I know about heart disease, diabetes, and high blood pressure? Blood pressure and heart disease  High blood pressure causes heart disease and increases the risk of stroke. This is more likely to develop in people who have high blood pressure readings, are of African descent, or are overweight.  Have your blood pressure checked: ? Every 3-5 years if you are 80-71 years of age. ? Every year if you are 47 years old or older. Diabetes Have regular diabetes screenings. This checks your fasting blood sugar level. Have the screening done:  Once every three years after age 2 if you are at a normal weight and have a low risk for diabetes.  More often and at a younger age if you are overweight or have a high risk for diabetes. What should I know about preventing infection? Hepatitis B If you have a higher risk for hepatitis B, you should be screened for this virus. Talk with your health care provider to find out if you are at risk for hepatitis B infection. Hepatitis C Testing is recommended for:  Everyone born from [birth year] through 1965.  Anyone with known risk factors for hepatitis C. Sexually transmitted infections (STIs)  Get screened for STIs, including gonorrhea and chlamydia, if: ? You are sexually active and are younger than 47 years of age. ? You are older than 47 years of age and your health care provider tells you that you are at risk for this type of infection. ?  Your sexual activity has changed since you were last screened, and you are at increased risk for chlamydia or gonorrhea. Ask your health care provider if you are at risk.  Ask your health care provider about whether you are at high risk for HIV. Your health care provider may recommend a prescription medicine to help prevent HIV infection. If you choose to take medicine to prevent HIV, you should first get tested for HIV.  You should then be tested every 3 months for as long as you are taking the medicine. Pregnancy  If you are about to stop having your period (premenopausal) and you may become pregnant, seek counseling before you get pregnant.  Take 400 to 800 micrograms (mcg) of folic acid every day if you become pregnant.  Ask for birth control (contraception) if you want to prevent pregnancy. Osteoporosis and menopause Osteoporosis is a disease in which the bones lose minerals and strength with aging. This can result in bone fractures. If you are 57 years old or older, or if you are at risk for osteoporosis and fractures, ask your health care provider if you should:  Be screened for bone loss.  Take a calcium or vitamin D supplement to lower your risk of fractures.  Be given hormone replacement therapy (HRT) to treat symptoms of menopause. Follow these instructions at home: Lifestyle  Do not use any products that contain nicotine or tobacco, such as cigarettes, e-cigarettes, and chewing tobacco. If you need help quitting, ask your health care provider.  Do not use street drugs.  Do not share needles.  Ask your health care provider for help if you need support or information about quitting drugs. Alcohol use  Do not drink alcohol if: ? Your health care provider tells you not to drink. ? You are pregnant, may be pregnant, or are planning to become pregnant.  If you drink alcohol: ? Limit how much you use to 0-1 drink a day. ? Limit intake if you are breastfeeding.  Be aware of how much alcohol is in your drink. In the [place] one drink equals one 12 oz bottle of beer (355 mL), one 5 oz glass of wine (148 mL), or one 1 oz glass of hard liquor (44 mL). General instructions  Schedule regular health, dental, and eye exams.  Stay current with your vaccines.  Tell your health care provider if: ? You often feel depressed. ? You have ever been abused or do not feel safe at  home. Summary  Adopting a healthy lifestyle and getting preventive care are important in promoting health and wellness.  Follow your health care provider's instructions about healthy diet, exercising, and getting tested or screened for diseases.  Follow your health care provider's instructions on monitoring your cholesterol and blood pressure. This information is not intended to replace advice given to you by your health care provider. Make sure you discuss any questions you have with your health care provider. Document Revised: 10/08/2018 Document Reviewed: 10/08/2018 Elsevier Patient Education  2021 [place] Maintenance, Female Adopting a healthy lifestyle and getting preventive care are important in promoting health and wellness. Ask your health care provider about:  The right schedule for you to have regular tests and exams.  Things you can do on your own to prevent diseases and keep yourself healthy. What should I know about diet, weight, and exercise? Eat a healthy diet  Eat a diet that includes plenty of vegetables, fruits, low-fat dairy products, and lean protein.  Do  not eat a lot of foods that are high in solid fats, added sugars, or sodium.   Maintain a healthy weight Body mass index (BMI) is used to identify weight problems. It estimates body fat based on height and weight. Your health care provider can help determine your BMI and help you achieve or maintain a healthy weight. Get regular exercise Get regular exercise. This is one of the most important things you can do for your health. Most adults should:  Exercise for at least 150 minutes each week. The exercise should increase your heart rate and make you sweat (moderate-intensity exercise).  Do strengthening exercises at least twice a week. This is in addition to the moderate-intensity exercise.  Spend less time sitting. Even light physical activity can be beneficial. Watch cholesterol and blood  lipids Have your blood tested for lipids and cholesterol at 47 years of age, then have this test every 5 years. Have your cholesterol levels checked more often if:  Your lipid or cholesterol levels are high.  You are older than 47 years of age.  You are at high risk for heart disease. What should I know about cancer screening? Depending on your health history and family history, you may need to have cancer screening at various ages. This may include screening for:  Breast cancer.  Cervical cancer.  Colorectal cancer.  Skin cancer.  Lung cancer. What should I know about heart disease, diabetes, and high blood pressure? Blood pressure and heart disease  High blood pressure causes heart disease and increases the risk of stroke. This is more likely to develop in people who have high blood pressure readings, are of African descent, or are overweight.  Have your blood pressure checked: ? Every 3-5 years if you are 12-51 years of age. ? Every year if you are 37 years old or older. Diabetes Have regular diabetes screenings. This checks your fasting blood sugar level. Have the screening done:  Once every three years after age 79 if you are at a normal weight and have a low risk for diabetes.  More often and at a younger age if you are overweight or have a high risk for diabetes. What should I know about preventing infection? Hepatitis B If you have a higher risk for hepatitis B, you should be screened for this virus. Talk with your health care provider to find out if you are at risk for hepatitis B infection. Hepatitis C Testing is recommended for:  Everyone born from [birth year] through 1965.  Anyone with known risk factors for hepatitis C. Sexually transmitted infections (STIs)  Get screened for STIs, including gonorrhea and chlamydia, if: ? You are sexually active and are younger than 47 years of age. ? You are older than 47 years of age and your health care provider tells you that  you are at risk for this type of infection. ? Your sexual activity has changed since you were last screened, and you are at increased risk for chlamydia or gonorrhea. Ask your health care provider if you are at risk.  Ask your health care provider about whether you are at high risk for HIV. Your health care provider may recommend a prescription medicine to help prevent HIV infection. If you choose to take medicine to prevent HIV, you should first get tested for HIV. You should then be tested every 3 months for as long as you are taking the medicine. Pregnancy  If you are about to stop having your period (premenopausal) and you  may become pregnant, seek counseling before you get pregnant.  Take 400 to 800 micrograms (mcg) of folic acid every day if you become pregnant.  Ask for birth control (contraception) if you want to prevent pregnancy. Osteoporosis and menopause Osteoporosis is a disease in which the bones lose minerals and strength with aging. This can result in bone fractures. If you are 68 years old or older, or if you are at risk for osteoporosis and fractures, ask your health care provider if you should:  Be screened for bone loss.  Take a calcium or vitamin D supplement to lower your risk of fractures.  Be given hormone replacement therapy (HRT) to treat symptoms of menopause. Follow these instructions at home: Lifestyle  Do not use any products that contain nicotine or tobacco, such as cigarettes, e-cigarettes, and chewing tobacco. If you need help quitting, ask your health care provider.  Do not use street drugs.  Do not share needles.  Ask your health care provider for help if you need support or information about quitting drugs. Alcohol use  Do not drink alcohol if: ? Your health care provider tells you not to drink. ? You are pregnant, may be pregnant, or are planning to become pregnant.  If you drink alcohol: ? Limit how much you use to 0-1 drink a day. ? Limit  intake if you are breastfeeding.  Be aware of how much alcohol is in your drink. In the [place] one drink equals one 12 oz bottle of beer (355 mL), one 5 oz glass of wine (148 mL), or one 1 oz glass of hard liquor (44 mL). General instructions  Schedule regular health, dental, and eye exams.  Stay current with your vaccines.  Tell your health care provider if: ? You often feel depressed. ? You have ever been abused or do not feel safe at home. Summary  Adopting a healthy lifestyle and getting preventive care are important in promoting health and wellness.  Follow your health care provider's instructions about healthy diet, exercising, and getting tested or screened for diseases.  Follow your health care provider's instructions on monitoring your cholesterol and blood pressure. This information is not intended to replace advice given to you by your health care provider. Make sure you discuss any questions you have with your health care provider. Document Revised: 10/08/2018 Document Reviewed: 10/08/2018 Elsevier Patient Education  2021 [employer].

## 2021-01-31 NOTE — Progress Notes (Signed)
BP (!) 146/81 (BP Location: Left Arm, Patient Position: Sitting, Cuff Size: Normal)   Pulse 69   Temp 98.6 F (37 C) (Temporal)   Resp 18   Ht 5\' 4"  (1.626 m)   Wt 188 lb 3.2 oz (85.4 kg)   SpO2 100%   BMI 32.30 kg/m    Subjective:    Patient ID: Katherine Foster, female    DOB: 1976-06-06, 45 y.o.   MRN: [id number]  HPI: Katherine Foster is a 45 y.o. female with a history of HTN, migraines, irritable mood, and chronic cough who presents for a complete physical exam.   Chief Complaint  Patient presents with  . Hypertension  . Anxiety  . Weight Check    Pt currently taking Exipure Weight Supplement 1 tablet daily. Concern because she is not losing any weight  . Cough    Chronic cough and clearing throat. Pt currently treating the symptoms with gabapentin    Exercise: She is not exercising.  Diet: She eats out almost every meal, drinks mostly sweet tea.  Dental: Completes bi-annually Eye exam: Completes annually Colon cancer screening: Discussed, she declines. Mammogram: Due 03/2021 Pap Smear: Up to date (due 2024)  Hep C Screening: Due HIV screening: Due  Immunizations: Tdap: Up to date (2019) Covid-19: Completed, plus booster Influenza: Declines   Hypertension Pt managed on Losartan 100 mg daily, which she is taking as prescribed. BP 146/81 in office today. She has not been checking her BP at home since her last medication dose increase. She denies headache, dizziness, vision changes, chest pain, or shortness of breath.   Irritable mood Managed on Celexa 20 mg daily. PHQ9 is 0, GAD7 is 0 today.   Migraines Managed with Maxalt. She reports headaches only with menstrual cycle. Takes Maxalt approximately 1x/,month.   Chronic cough Takes gabapentin for chronic cough, though she recently stopped taking due to concern for medication related weight gain. Since stopping the gabapentin her cough has returned.  Obesity, BMI of 32   Concerned gabapentin has been contributing  to her weight gain. She is sedentary and eats mostly food from restaurants, drinks sweet tea. She has been taking OTC weight loss pills with no result. She is interested in medical weight loss management.    Relevant past medical, surgical, family and social history reviewed and updated as indicated. Interim medical history since our last visit reviewed. Allergies and medications reviewed and updated.  Review of Systems  Constitutional: Negative.   HENT: Negative.   Eyes: Negative.   Respiratory: Positive for cough.   Cardiovascular: Negative.   Gastrointestinal: Negative.   Endocrine: Negative.   Genitourinary: Negative.   Musculoskeletal: Negative.   Skin: Negative.   Neurological: Negative.   Hematological: Negative.   Psychiatric/Behavioral: Negative.     Per HPI unless specifically indicated above     Objective:    BP (!) 146/81 (BP Location: Left Arm, Patient Position: Sitting, Cuff Size: Normal)   Pulse 69   Temp 98.6 F (37 C) (Temporal)   Resp 18   Ht 5\' 4"  (1.626 m)   Wt 188 lb 3.2 oz (85.4 kg)   SpO2 100%   BMI 32.30 kg/m   Wt Readings from Last 3 Encounters:  01/31/21 188 lb 3.2 oz (85.4 kg)  04/05/20 189 lb 9.6 oz (86 kg)  02/02/20 192 lb 6.4 oz (87.3 kg)    Physical Exam Constitutional:      Appearance: She is obese.  HENT:     Head: Normocephalic.  Right Ear: There is impacted cerumen.     Left Ear: There is impacted cerumen.  Eyes:     Extraocular Movements: Extraocular movements intact.     Pupils: Pupils are equal, round, and reactive to light.  Cardiovascular:     Rate and Rhythm: Normal rate and regular rhythm.     Pulses: Normal pulses.     Heart sounds: Normal heart sounds.  Pulmonary:     Effort: Pulmonary effort is normal.     Breath sounds: Normal breath sounds.  Chest:  Breasts: Breasts are symmetrical.     Right: Normal. No mass, nipple discharge, skin change or tenderness.     Left: Normal. No mass, nipple discharge, skin  change or tenderness.    Abdominal:     General: Bowel sounds are normal. There is no distension.     Palpations: Abdomen is soft.  Musculoskeletal:        General: Normal range of motion.  Skin:    General: Skin is warm and dry.  Neurological:     Mental Status: She is alert and oriented to person, place, and time. Mental status is at baseline.     Cranial Nerves: No cranial nerve deficit.     Sensory: No sensory deficit.     Motor: No weakness.  Psychiatric:        Mood and Affect: Mood normal.     Results for orders placed or performed during the hospital encounter of 04/05/20  Surgical pathology  Result Value Ref Range   SURGICAL PATHOLOGY      SURGICAL PATHOLOGY CASE: [id number] PATIENT: Katherine Foster Surgical Pathology Report     Specimen Submitted: A. Breast, right  Clinical History: 0.9 cm cystic mass with some dependent debris at 4:00 1 CMFN.  Favor benign [place] or oil cyst. Coil shaped marker placed following ultrasound guided core needle biopsy of RIGHT breast at 4 o'clock, 1 cmfn.    DIAGNOSIS: A. BREAST, RIGHT AT 4:00, 1 CM FROM THE NIPPLE; ULTRASOUND-GUIDED CORE NEEDLE BIOPSY: - BENIGN MAMMARY PARENCHYMA WITH FIBROCYSTIC CHANGES AND APOCRINE METAPLASIA. - NEGATIVE FOR ATYPICAL PROLIFERATIVE BREAST DISEASE.  GROSS DESCRIPTION: A. Labeled: Right breast 4:00, 1 cm from nipple Received: In formalin Time/date in fixative: 9:24 AM on 04/05/2020 Cold ischemic time: Not provided Total fixation time: 7 hours Core pieces: 2 Size: 0.6-1.3 cm in length and 0.3 cm in diameter Description: Pale yellow cylindrically shaped tissue fragments Ink color: Blue Entirely submitted in 1 cassette.   Fin al Diagnosis performed by Allena Napoleon, MD.   Electronically signed 04/06/2020 2:45:54PM The electronic signature indicates that the named Attending Pathologist has evaluated the specimen Technical component performed at [facility], [address] [place], [place]  [address] Lab: [phone] Dir: Rush Farmer, MD, MMM  Professional component performed at [facility], [facility], [place], [place],  [address] Lab: [phone] Dir: Dellia Nims. Reuel Derby, MD       Assessment & Plan:   Problem List Items Addressed This Visit      Cardiovascular and Mediastinum   Hypertension    Currently taking Losartan 100 mg daily. BP 146/81 in office today, slightly above goal. She has not been checking her BP at home since her losartan was increased. She will check BP at home and report readings > 140/90. Continue losartan 100 mg daily. Encouraged lifestyle modifications including weight loss, diet and exercise. Follow-up as needed.       Migraine    Stable and well controlled. Taking Maxalt  1x/month, with menstrual cycle. Continue Maxalt as needed. Follow-up as needed.         Other   Irritable mood    Stable, well managed on Celexa 20 mg daily. PHQ 9 is 0, GAD 7 is 0 today. Continue Celexa. Follow-up as needed.        Other Visit Diagnoses    Routine general medical examination at a health care facility    -  Primary   Relevant Orders   Lipid panel   Comprehensive metabolic panel   CBC with Differential/Platelet   TSH   VITAMIN D 25 Hydroxy (Vit-D Deficiency, Fractures)   HIV Antibody (routine testing w rflx)   Hepatitis C antibody   Obesity (BMI 30.0-34.9)       Has been taking OTC supplement c/ no result. Will refer to healthy weight and wellness for medical weigh management.    Relevant Orders   Hemoglobin A1c   Amb Ref to Medical Weight Management   Encounter for screening mammogram for malignant neoplasm of breast       History of abnormal mammogram in the past. Breast exam normal today. Due for repeat screening 03/2021. Will place order. Pt will call to schedule.    Relevant Orders   MM Digital Diagnostic Bilat   Chronic cough       Saw specialist in the past, well managed on gabapentin, but stopped due to concern  for weight gain. Resume gabapentin. Follow up as needed.       Health Maintenance Physical exam unremarkable today. Reviewed health maintenance, pt is up to date on PAP, dental, and eye exams. Due for colonoscopy, discussed risk and benefits, however, pt declines at this time. She has a history of abnormal mammogram in the past and is due for repeat mammogram 03/2021. Will order-she will call to schedule. She agrees to Hep C/HIV screening today. Declines influenza vaccination, all other vaccines up to date. Routine labs ordered (TSH, lipid panel, CBC, CMP, Vitamin D, Hgb A1c) She is concerned about weight gain, will refer to healthy weight and wellness.   Follow up plan: No follow-ups on file. Follow-up in 1 year or sooner if needed.

## 2021-01-31 NOTE — Assessment & Plan Note (Signed)
Stable, well managed on Celexa 20 mg daily. PHQ 9 is 0, GAD 7 is 0 today. Continue Celexa. Follow-up as needed.

## 2021-01-31 NOTE — Assessment & Plan Note (Addendum)
Currently taking Losartan 100 mg daily. BP 146/81 in office today, slightly above goal. She has not been checking her BP at home since her losartan was increased. She will check BP at home and report readings > 140/90. Continue losartan 100 mg daily. Encouraged lifestyle modifications including weight loss, diet and exercise. Follow-up as needed.

## 2021-01-31 NOTE — Assessment & Plan Note (Signed)
Stable and well controlled. Taking Maxalt 1x/month, with menstrual cycle. Continue Maxalt as needed. Follow-up as needed.

## 2021-02-01 ENCOUNTER — Other Ambulatory Visit: Payer: Self-pay | Admitting: Unknown Physician Specialty

## 2021-02-01 LAB — HEMOGLOBIN A1C
Hgb A1c MFr Bld: 5.6 % of total Hgb (ref <5.7)
Mean Plasma Glucose: 114 mg/dL
eAG (mmol/L): 6.3 mmol/L

## 2021-02-01 LAB — COMPREHENSIVE METABOLIC PANEL
AG Ratio: 1.6 (calc) (ref 1.0–2.5)
ALT: 25 U/L (ref 6–29)
AST: 14 U/L (ref 10–35)
Albumin: 4.4 g/dL (ref 3.6–5.1)
Alkaline phosphatase (APISO): 70 U/L (ref 31–125)
BUN: 8 mg/dL (ref 7–25)
CO2: 28 mmol/L (ref 20–32)
Calcium: 9.2 mg/dL (ref 8.6–10.2)
Chloride: 104 mmol/L (ref 98–110)
Creat: 0.87 mg/dL (ref 0.50–1.10)
Globulin: 2.7 g/dL (calc) (ref 1.9–3.7)
Glucose, Bld: 75 mg/dL (ref 65–99)
Potassium: 4.1 mmol/L (ref 3.5–5.3)
Sodium: 139 mmol/L (ref 135–146)
Total Bilirubin: 0.5 mg/dL (ref 0.2–1.2)
Total Protein: 7.1 g/dL (ref 6.1–8.1)

## 2021-02-01 LAB — CBC WITH DIFFERENTIAL/PLATELET
Absolute Monocytes: 483 cells/uL (ref 200–950)
Basophils Absolute: 84 cells/uL (ref 0–200)
Basophils Relative: 0.8 %
Eosinophils Absolute: 452 cells/uL (ref 15–500)
Eosinophils Relative: 4.3 %
HCT: 40 % (ref 35.0–45.0)
Hemoglobin: 13.1 g/dL (ref 11.7–15.5)
Lymphs Abs: 2888 cells/uL (ref 850–3900)
MCH: 28.6 pg (ref 27.0–33.0)
MCHC: 32.8 g/dL (ref 32.0–36.0)
MCV: 87.3 fL (ref 80.0–100.0)
MPV: 10 fL (ref 7.5–12.5)
Monocytes Relative: 4.6 %
Neutro Abs: 6594 cells/uL (ref 1500–7800)
Neutrophils Relative %: 62.8 %
Platelets: 322 10*3/uL (ref 140–400)
RBC: 4.58 10*6/uL (ref 3.80–5.10)
RDW: 12.6 % (ref 11.0–15.0)
Total Lymphocyte: 27.5 %
WBC: 10.5 10*3/uL (ref 3.8–10.8)

## 2021-02-01 LAB — VITAMIN D 25 HYDROXY (VIT D DEFICIENCY, FRACTURES): Vit D, 25-Hydroxy: 13 ng/mL — ABNORMAL LOW (ref 30–100)

## 2021-02-01 LAB — LIPID PANEL
Cholesterol: 219 mg/dL — ABNORMAL HIGH (ref <200)
HDL: 38 mg/dL — ABNORMAL LOW (ref >=50)
LDL Cholesterol (Calc): 147 mg/dL (calc) — ABNORMAL HIGH
Non-HDL Cholesterol (Calc): 181 mg/dL (calc) — ABNORMAL HIGH (ref <130)
Total CHOL/HDL Ratio: 5.8 (calc) — ABNORMAL HIGH (ref <5.0)
Triglycerides: 207 mg/dL — ABNORMAL HIGH (ref <150)

## 2021-02-01 LAB — TSH: TSH: 2 mIU/L

## 2021-02-01 LAB — HIV ANTIBODY (ROUTINE TESTING W REFLEX): HIV 1&2 Ab, 4th Generation: NONREACTIVE

## 2021-02-01 MED ORDER — VITAMIN D (ERGOCALCIFEROL) 1.25 MG (50000 UNIT) PO CAPS
50000.0000 [IU] | ORAL_CAPSULE | ORAL | 0 refills | Status: DC
Start: 2021-02-01 — End: 2021-05-15

## 2021-02-18 ENCOUNTER — Other Ambulatory Visit: Payer: Self-pay | Admitting: Family Medicine

## 2021-02-18 DIAGNOSIS — R454 Irritability and anger: Secondary | ICD-10-CM

## 2021-02-18 NOTE — Telephone Encounter (Signed)
Requested Prescriptions  Pending Prescriptions Disp Refills  . citalopram (CELEXA) 20 MG tablet [Pharmacy Med Name: CITALOPRAM 20MG  TABLETS] 90 tablet 0    Sig: TAKE 1 TABLET(20 MG) BY MOUTH DAILY. FOLLOW-UP APPOINTMENT NEEDED FOR ANXIETY     Psychiatry:  Antidepressants - SSRI Passed - 02/18/2021 10:52 AM      Passed - Valid encounter within last 6 months    Recent Outpatient Visits          2 weeks ago Routine general medical examination at a health care facility   [place], NP   10 months ago Abscess   [facility], Lupita Raider, FNP   11 months ago Hypertension, unspecified type   [place], FNP   1 year ago Hypertension, unspecified type   [place], FNP   1 year ago Hypertension, unspecified type   [facility], Lupita Raider, [place]

## 2021-03-15 ENCOUNTER — Ambulatory Visit: Payer: Self-pay | Admitting: *Deleted

## 2021-03-15 ENCOUNTER — Emergency Department
Admission: EM | Admit: 2021-03-15 | Discharge: 2021-03-15 | Disposition: A | Discharge status: Home / Self Care | Payer: BC Managed Care – PPO | Attending: Emergency Medicine | Admitting: Emergency Medicine

## 2021-03-15 ENCOUNTER — Other Ambulatory Visit: Payer: Self-pay

## 2021-03-15 DIAGNOSIS — N939 Abnormal uterine and vaginal bleeding, unspecified: Secondary | ICD-10-CM | POA: Insufficient documentation

## 2021-03-15 DIAGNOSIS — Z79899 Other long term (current) drug therapy: Secondary | ICD-10-CM | POA: Diagnosis not present

## 2021-03-15 DIAGNOSIS — I10 Essential (primary) hypertension: Secondary | ICD-10-CM | POA: Diagnosis not present

## 2021-03-15 DIAGNOSIS — K625 Hemorrhage of anus and rectum: Secondary | ICD-10-CM

## 2021-03-15 LAB — CBC
HCT: 38.6 % (ref 36.0–46.0)
Hemoglobin: 13.1 g/dL (ref 12.0–15.0)
MCH: 29.2 pg (ref 26.0–34.0)
MCHC: 33.9 g/dL (ref 30.0–36.0)
MCV: 86 fL (ref 80.0–100.0)
Platelets: 284 10*3/uL (ref 150–400)
RBC: 4.49 MIL/uL (ref 3.87–5.11)
RDW: 12.4 % (ref 11.5–15.5)
WBC: 12.4 10*3/uL — ABNORMAL HIGH (ref 4.0–10.5)
nRBC: 0 % (ref 0.0–0.2)

## 2021-03-15 LAB — COMPREHENSIVE METABOLIC PANEL
ALT: 21 U/L (ref 0–44)
AST: 15 U/L (ref 15–41)
Albumin: 4 g/dL (ref 3.5–5.0)
Alkaline Phosphatase: 65 U/L (ref 38–126)
Anion gap: 10 (ref 5–15)
BUN: 12 mg/dL (ref 6–20)
CO2: 23 mmol/L (ref 22–32)
Calcium: 8.9 mg/dL (ref 8.9–10.3)
Chloride: 104 mmol/L (ref 98–111)
Creatinine, Ser: 0.8 mg/dL (ref 0.44–1.00)
GFR, Estimated: >60 mL/min (ref >60)
Glucose, Bld: 86 mg/dL (ref 70–99)
Potassium: 4 mmol/L (ref 3.5–5.1)
Sodium: 137 mmol/L (ref 135–145)
Total Bilirubin: 0.6 mg/dL (ref 0.3–1.2)
Total Protein: 7.3 g/dL (ref 6.5–8.1)

## 2021-03-15 LAB — POC URINE PREG, ED: Preg Test, Ur: NEGATIVE

## 2021-03-15 MED ORDER — DOCUSATE SODIUM 100 MG PO CAPS
100.0000 mg | ORAL_CAPSULE | Freq: Two times a day (BID) | ORAL | 2 refills | Status: DC
Start: 2021-03-15 — End: 2021-04-18

## 2021-03-15 NOTE — ED Notes (Signed)
Patient awaiting registration and copay.

## 2021-03-15 NOTE — Telephone Encounter (Signed)
Pt called in c/o bright red blood in her stools that started day before yesterday.    "The water in the toilet is dark red enough I can't see the stool in the water".   "I'm concerned".  She noted one clot yesterday.   Denies being dizzy or weak.  No dark, black stools noted prior to bright red bloody stools.   Denies being in pain.  See notes below.  I have referred her to the ED per the protocol.   She is going to [facility].  I sent my notes to [facility] since that is where she gets her primary care.        Reason for Disposition . [1] MODERATE rectal bleeding (small blood clots, passing blood without stool, or toilet water turns red) AND [2] more than once a day  Answer Assessment - Initial Assessment Questions 1. APPEARANCE of BLOOD: "What color is it?" "Is it passed separately, on the surface of the stool, or mixed in with the stool?"      I'm passing blood with my BMs.   I thought I was on my period but I'm not.   Bleeding started day before yesterday.   I've had a tear before but this is different.  2. AMOUNT: "How much blood was passed?"      Blood in stool and toilet water it's a dark red.   I can't see stool in water due to dark red.   3. FREQUENCY: "How many times has blood been passed with the stools?"      Day before yesterday had 6 bloody stools until now.   No dark stools 4. ONSET: "When was the blood first seen in the stools?" (Days or weeks)      Day before yesterday 5. DIARRHEA: "Is there also some diarrhea?" If Yes, ask: "How many diarrhea stools in the past 24 hours?"      No 6. CONSTIPATION: "Do you have constipation?" If Yes, ask: "How bad is it?"     No 7. RECURRENT SYMPTOMS: "Have you had blood in your stools before?" If Yes, ask: "When was the last time?" and "What happened that time?"      Bi 8. BLOOD THINNERS: "Do you take any blood thinners?" (e.g., Coumadin/warfarin, Pradaxa/dabigatran, aspirin)     No 9. OTHER  SYMPTOMS: "Do you have any other symptoms?"  (e.g., abdomen pain, vomiting, dizziness, fever)     No    10. PREGNANCY: "Is there any chance you are pregnant?" "When was your last menstrual period?"       No   I'm due period in 2 weeks.   I did put in a tampon yesterday morning and it had a very light bleeding on it.    None today.  Protocols used: RECTAL BLEEDING-A-AH

## 2021-03-15 NOTE — ED Triage Notes (Signed)
Pt  States she has noticed bright red blood with BM over the past couple of days, also c/o having some light vaginal bleeding for 3 days and is not due for her normal menstrual for about 2 more weeks. Denies any pain

## 2021-03-15 NOTE — ED Provider Notes (Signed)
[facility] Emergency Department Provider Note  Time seen: 11:39 AM  I have reviewed the triage vital signs and the nursing notes.   HISTORY  Chief Complaint Rectal Bleeding  HPI Katherine Foster is a 45 y.o. female with a past medical history of migraines, hypertension, presents to the emergency department for rectal bleeding and vaginal bleeding.  According to the patient for the past 2 to 3 days she has noted bright red blood in the toilet mostly when having a bowel movement.  She is also noted some vaginal bleeding today and states she is not due for her period for 2 weeks.  Patient denies any abdominal pain.  No nausea or vomiting.  Overall patient appears well.  No history of rectal bleeding previously.  Grandmother had colon cancer but no other family history.   History reviewed. No pertinent past medical history.  Patient Active Problem List   Diagnosis Date Noted  . Labial abscess 04/05/2020  . Well woman exam with routine gynecological exam 02/02/2020  . Hx of migraine headaches 12/29/2019  . Flat feet, bilateral 12/29/2019  . Bilateral chronic knee pain 12/29/2019  . Establishing care with new doctor, encounter for 12/29/2019  . Abnormal mammogram of right breast 11/07/2016  . LPRD (laryngopharyngeal reflux disease) 07/14/2015  . H/O allergic rhinitis 07/14/2015  . Irritable mood 09/17/2013  . Hypertension 03/10/2013  . Esophageal reflux 07/31/2012  . Migraine 11/17/2008    Past Surgical History:  Procedure Laterality Date  . BREAST BIOPSY Right 04/05/2020   [place] bx, coil marker, path pending  . LASIK  11/06/2019  . WISDOM TOOTH EXTRACTION      Prior to Admission medications   Medication Sig Start Date End Date Taking? Authorizing Provider  citalopram (CELEXA) 20 MG tablet TAKE 1 TABLET(20 MG) BY MOUTH DAILY. FOLLOW-UP APPOINTMENT NEEDED FOR ANXIETY 02/18/21   Karamalegos, Devonne Doughty, DO  fexofenadine (ALLEGRA) 180 MG tablet Take 180 mg by  mouth daily.    [provider]  gabapentin (NEURONTIN) 300 MG capsule Take 1 capsule (300 mg total) by mouth 3 (three) times daily. Patient not taking: Reported on 01/31/2021 02/24/20   Verl Bangs, FNP  losartan (COZAAR) 100 MG tablet TAKE 1 TABLET(100 MG) BY MOUTH DAILY 01/26/21   Parks Ranger, Devonne Doughty, DO  rizatriptan (MAXALT) 10 MG tablet Take 1 tablet (10 mg total) by mouth as needed for migraine. May repeat in 2 hours if needed Patient taking differently: Take 5 mg by mouth as needed for migraine. May repeat in 2 hours if needed 01/23/21   Olin Hauser, DO  Vitamin D, Ergocalciferol, (DRISDOL) 1.25 MG (50000 UNIT) CAPS capsule Take 1 capsule (50,000 Units total) by mouth every 7 (seven) days. 02/01/21   Kathrine Haddock, NP    Allergies  Allergen Reactions  . Sulfa Antibiotics     Family History  Problem Relation Age of Onset  . Diabetes Mellitus II Mother   . Hypothyroidism Mother   . Heart disease Father   . Heart attack Father   . Diabetes Mellitus II Father   . Breast cancer Maternal Grandmother   . Cancer Maternal Grandmother   . Diabetes Maternal Grandfather   . Heart attack Paternal Grandfather     Social History Social History   Tobacco Use  . Smoking status: Never Smoker  . Smokeless tobacco: Never Used  Vaping Use  . Vaping Use: Never used  Substance Use Topics  . Alcohol use: Yes  . Drug use: Never  Review of Systems Constitutional: Negative for fever. Cardiovascular: Negative for chest pain. Respiratory: Negative for shortness of breath. Gastrointestinal: Negative for abdominal pain.  Positive for rectal bleeding Genitourinary: Vaginal spotting Musculoskeletal: Negative for musculoskeletal complaints Neurological: Negative for headache All other ROS negative  ____________________________________________   PHYSICAL EXAM:  VITAL SIGNS: ED Triage Vitals  Enc Vitals Group     BP 03/15/21 1015 (!) 147/80     Pulse Rate  03/15/21 1015 77     Resp 03/15/21 1015 15     Temp 03/15/21 1015 98.4 F (36.9 C)     Temp Source 03/15/21 1015 Oral     SpO2 03/15/21 1015 96 %     Weight 03/15/21 1016 109 lb (49.4 kg)     Height 03/15/21 1016 5\' 4"  (1.626 m)     Head Circumference --      Peak Flow --      Pain Score 03/15/21 1016 0     Pain Loc --      Pain Edu? --      Excl. in [place]? --    Constitutional: Alert and oriented. Well appearing and in no distress. Eyes: Normal exam ENT      Head: Normocephalic and atraumatic.      Mouth/Throat: Mucous membranes are moist. Cardiovascular: Normal rate, regular rhythm.  Respiratory: Normal respiratory effort without tachypnea nor retractions. Breath sounds are clear Gastrointestinal: Soft and nontender. No distention.   Musculoskeletal: Nontender with normal range of motion in all extremities. Neurologic:  Normal speech and language. No gross focal neurologic deficits  Skin:  Skin is warm, dry and intact.  Psychiatric: Mood and affect are normal.     INITIAL IMPRESSION / ASSESSMENT AND PLAN / ED COURSE  Pertinent labs & imaging results that were available during my care of the patient were reviewed by me and considered in my medical decision making (see chart for details).   Patient presents emergency department for rectal bleeding over the past 2 to 3 days as well as vaginal spotting.  Patient's lab work is reassuring.  Pregnancy test is negative.  H&H is stable and chemistry is normal.  Rectal examination shows no obvious hemorrhoids patient has light brown stool that is slightly guaiac positive.  Patient is having mild vaginal bleeding currently as well.  Given the patient's reassuring work-up I believe the patient is safe for discharge home however I did discuss with the patient starting a stool softener Colace twice daily and following up with GI medicine as she will likely need a colonoscopy.  Patient will follow up with her doctor.  Patient agreeable to plan of  care.  Katherine Foster was evaluated in Emergency Department on 03/15/2021 for the symptoms described in the history of present illness. She was evaluated in the context of the global COVID-19 pandemic, which necessitated consideration that the patient might be at risk for infection with the SARS-CoV-2 virus that causes COVID-19. Institutional protocols and algorithms that pertain to the evaluation of patients at risk for COVID-19 are in a state of rapid change based on information released by regulatory bodies including the CDC and federal and state organizations. These policies and algorithms were followed during the patient's care in the ED.  ____________________________________________   FINAL CLINICAL IMPRESSION(S) / ED DIAGNOSES  Vaginal spotting Rectal bleeding   Harvest Dark, MD 03/15/21 1141

## 2021-03-15 NOTE — Discharge Instructions (Addendum)
Please call the number provided for GI medicine to arrange a follow-up appointment.  Please begin using Colace twice daily if you have diarrhea please reduce to once daily.  Return to the emergency department for any significant increase in bleeding development of abdominal pain, fever, or any other symptom personally concerning to yourself.

## 2021-03-22 ENCOUNTER — Telehealth: Payer: Self-pay

## 2021-03-22 NOTE — Telephone Encounter (Signed)
Copied from [place] [phone]. Topic: General - Other >> Mar 22, 2021 10:50 AM Antonieta Iba C wrote: Reason for CRM: pt called in for assistance. Pt says that she was told by [facility] to have new PCP Rollene Fare provide an updated order for mammogram. Pt says that she was told that due to [facility] not seeing pt's they are unable to accept her order that was placed in a previous visit for pt.   Please assist further.

## 2021-03-23 ENCOUNTER — Other Ambulatory Visit: Payer: Self-pay | Admitting: Internal Medicine

## 2021-03-23 DIAGNOSIS — Z1231 Encounter for screening mammogram for malignant neoplasm of breast: Secondary | ICD-10-CM

## 2021-04-05 ENCOUNTER — Other Ambulatory Visit: Payer: Self-pay

## 2021-04-05 ENCOUNTER — Ambulatory Visit
Admission: RE | Admit: 2021-04-05 | Discharge: 2021-04-05 | Disposition: A | Discharge status: Home / Self Care | Payer: BC Managed Care – PPO | Source: Ambulatory Visit | Attending: Internal Medicine | Admitting: Internal Medicine

## 2021-04-05 DIAGNOSIS — Z1231 Encounter for screening mammogram for malignant neoplasm of breast: Secondary | ICD-10-CM | POA: Diagnosis not present

## 2021-04-18 ENCOUNTER — Encounter (INDEPENDENT_AMBULATORY_CARE_PROVIDER_SITE_OTHER): Payer: Self-pay | Admitting: Family Medicine

## 2021-04-18 ENCOUNTER — Other Ambulatory Visit: Payer: Self-pay

## 2021-04-18 ENCOUNTER — Ambulatory Visit (INDEPENDENT_AMBULATORY_CARE_PROVIDER_SITE_OTHER): Payer: BC Managed Care – PPO | Admitting: Family Medicine

## 2021-04-18 VITALS — BP 107/68 | HR 66 | Temp 98.2°F | Ht 64.0 in | Wt 184.0 lb

## 2021-04-18 DIAGNOSIS — K5909 Other constipation: Secondary | ICD-10-CM

## 2021-04-18 DIAGNOSIS — E782 Mixed hyperlipidemia: Secondary | ICD-10-CM

## 2021-04-18 DIAGNOSIS — M2550 Pain in unspecified joint: Secondary | ICD-10-CM

## 2021-04-18 DIAGNOSIS — F419 Anxiety disorder, unspecified: Secondary | ICD-10-CM

## 2021-04-18 DIAGNOSIS — Z9189 Other specified personal risk factors, not elsewhere classified: Secondary | ICD-10-CM | POA: Diagnosis not present

## 2021-04-18 DIAGNOSIS — Z1331 Encounter for screening for depression: Secondary | ICD-10-CM

## 2021-04-18 DIAGNOSIS — E559 Vitamin D deficiency, unspecified: Secondary | ICD-10-CM

## 2021-04-18 DIAGNOSIS — R0602 Shortness of breath: Secondary | ICD-10-CM | POA: Diagnosis not present

## 2021-04-18 DIAGNOSIS — R5383 Other fatigue: Secondary | ICD-10-CM

## 2021-04-18 DIAGNOSIS — F3289 Other specified depressive episodes: Secondary | ICD-10-CM

## 2021-04-18 DIAGNOSIS — Z0289 Encounter for other administrative examinations: Secondary | ICD-10-CM

## 2021-04-18 DIAGNOSIS — I10 Essential (primary) hypertension: Secondary | ICD-10-CM

## 2021-04-18 DIAGNOSIS — R058 Other specified cough: Secondary | ICD-10-CM

## 2021-04-18 DIAGNOSIS — Z6831 Body mass index (BMI) 31.0-31.9, adult: Secondary | ICD-10-CM

## 2021-04-18 DIAGNOSIS — R0683 Snoring: Secondary | ICD-10-CM

## 2021-04-18 DIAGNOSIS — Z8669 Personal history of other diseases of the nervous system and sense organs: Secondary | ICD-10-CM

## 2021-04-18 DIAGNOSIS — E669 Obesity, unspecified: Secondary | ICD-10-CM

## 2021-04-25 NOTE — Progress Notes (Signed)
Dear Katherine Foster, Katherine Foster,   Thank you for referring Katherine Foster to our clinic. The following note includes my evaluation and treatment recommendations.  Chief Complaint:   OBESITY Katherine Foster (MR# [id number]) is a 45 y.o. female who presents for evaluation and treatment of obesity and related comorbidities. Current BMI is Body mass index is 31.58 kg/m. Katherine Foster has been struggling with her weight for many years and has been unsuccessful in either losing weight, maintaining weight loss, or reaching her healthy weight goal.  Katherine Foster is currently in the action stage of change and ready to dedicate time achieving and maintaining a healthier weight. Katherine Foster is interested in becoming our patient and working on intensive lifestyle modifications including (but not limited to) diet and exercise for weight loss.  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  She is separated and lives with her daughter (54) and mother (39).  Eats out a lot.  She drinks two 16-20 ounce bottles of sweet tea and one 16-20 ounce bottle of soda per day.    Katherine Foster habits were reviewed today and are as follows: Her family eats meals together, her desired weight loss is 40-45 pounds, she started gaining weight at about 45 years of age, her heaviest weight ever was 190 pounds, she skips breakfast frequently, she is frequently drinking liquids with calories, and she frequently makes poor food choices.  Depression Screen Legend's Food and Mood (modified PHQ-9) score was 6.  Depression screen PHQ 2/9 04/18/2021  Decreased Interest 1  Down, Depressed, Hopeless 1  PHQ - 2 Score 2  Altered sleeping 0  Tired, decreased energy 2  Change in appetite 1  Feeling bad or failure about yourself  1  Trouble concentrating 0  Moving slowly or fidgety/restless 0  Suicidal thoughts 0  PHQ-9 Score 6  Difficult doing work/chores Not difficult at all   Assessment/Plan:   1. Other fatigue Katherine Foster admits to  daytime somnolence and reports waking up still tired. Patent has a history of symptoms of daytime fatigue, morning fatigue, and snoring. Katherine Foster generally gets 7 hours of sleep per night, and states that she has generally restful sleep. Snoring is present. Apneic episodes are not present. Epworth Sleepiness Score is 6.  Katherine Foster does feel that her weight is causing her energy to be lower than it should be. Fatigue may be related to obesity, depression or many other causes. Labs will be ordered, and in the meanwhile, Katherine Foster will focus on self care including making healthy food choices, increasing physical activity and focusing on stress reduction.  - EKG 12-Lead  2. SOB (shortness of breath) on exertion Katherine Foster notes increasing shortness of breath with exercising and seems to be worsening over time with weight gain. She notes getting out of breath sooner with activity than she used to. This has gotten worse recently. Katherine Foster denies shortness of breath at rest or orthopnea.  Katherine Foster does feel that she gets out of breath more easily that she used to when she exercises. Katherine Foster's shortness of breath appears to be obesity related and exercise induced. She has agreed to work on weight loss and gradually increase exercise to treat her exercise induced shortness of breath. Will continue to monitor closely.  3. Essential hypertension At goal. Medications: Cozaar 100 mg daily.   Plan: Avoid buying foods that are: processed, frozen, or prepackaged to avoid excess salt. We will watch for signs of hypotension as she continues lifestyle modifications.  BP Readings from  Last 3 Encounters:  04/18/21 107/68  03/15/21 (!) 156/84  01/31/21 (!) 146/81   Lab Results  Component Value Date   CREATININE 0.80 03/15/2021   4. Mixed hyperlipidemia Course: Not at goal. Lipid-lowering medications: None.   Plan: Dietary changes: Increase soluble fiber, decrease simple carbohydrates, decrease saturated fat. Exercise changes:  Moderate to vigorous-intensity aerobic activity 150 minutes per week or as tolerated. We will continue to monitor along with PCP/specialists as it pertains to her weight loss journey.  Lab Results  Component Value Date   CHOL 219 (H) 01/31/2021   HDL 38 (L) 01/31/2021   LDLCALC 147 (H) 01/31/2021   TRIG 207 (H) 01/31/2021   CHOLHDL 5.8 (H) 01/31/2021   Lab Results  Component Value Date   ALT 21 03/15/2021   AST 15 03/15/2021   ALKPHOS 65 03/15/2021   BILITOT 0.6 03/15/2021   The 10-year ASCVD risk score Katherine Foster DC Jr., et al., 2013) is: 1.7%   Values used to calculate the score:     Age: 18 years     Sex: Female     Is Non-Hispanic African American: No     Diabetic: No     Tobacco smoker: No     Systolic Blood Pressure: 409 mmHg     Is BP treated: Yes     HDL Cholesterol: 38 mg/dL     Total Cholesterol: 219 mg/dL  5. Vitamin D deficiency Not at goal. Current vitamin D is 13.0, tested on 01/31/2021. Optimal goal > 50 ng/dL.  She is taking vitamin D 50,000 IU weekly.  Plan: Continue to take prescription Vitamin D @50 ,000 IU every week as prescribed.  Follow-up for routine testing of Vitamin D, at least 2-3 times per year to avoid over-replacement.  6. Other constipation Endorses BRBPR recently.  This problem is uncontrolled. [name] was informed that a decrease in bowel movement frequency is normal while losing weight, but stools should not be hard or painful.  Counseling: Getting to Good Bowel Health: Your goal is to have one soft bowel movement each day. Drink at least 8 glasses of water each day. Eat plenty of fiber (goal is over 30 grams each day). It is best to get most of your fiber from dietary sources which includes leafy green vegetables, fresh fruit, and whole grains. You may need to add fiber with the help of OTC fiber supplements. These include Metamucil, Citrucel, and Benefiber. If you are still having trouble, try adding an osmotic laxative such as Miralax. If all of these  changes do not work, Katherine Foster.   7. Snores Katherine Foster endorses snoring.  Epworth sleepiness score is 6.  8. Multiple joint pain In knees and feet.  She says she has flat feet (plantar fasciitis).  Bilateral knee pain.  9. Upper airway cough syndrome She was prescribed gabapentin and stopped taking it 2 months ago.  Uses as needed now.  10. Hx of migraine headaches Katherine Foster has Maxalt to take for rescue in the case of a migraine.  11. Anxiety, with emotional eating Not at goal. Medication: Celexa 20 mg daily.  Plan:  Behavior modification techniques were discussed today to help deal with emotional/non-hunger eating behaviors.  12. Depression screening Katherine Foster was screened for depression as part of her new patient visit.  PHQ-9 is 6.  13. At risk for heart disease Due to Yamaris's current state of health and medical condition(s), she is at a higher risk for heart disease.  This puts the patient at much  greater risk to subsequently develop cardiopulmonary conditions that can significantly affect patient's quality of life in a negative manner.    At least 8 minutes were spent on counseling Katherine Foster about these concerns today. Evidence-based interventions for health behavior change were utilized today including the discussion of self monitoring techniques, problem-solving barriers, and SMART goal setting techniques.  Specifically, regarding patient's less desirable eating habits and patterns, we employed the technique of small changes when Katherine Foster has not been able to fully commit to her prudent nutritional plan.  14. Class 1 obesity with serious comorbidity and body mass index (BMI) of 31.0 to 31.9 in adult, unspecified obesity type  Katherine Foster is currently in the action stage of change and her goal is to continue with weight loss efforts. I recommend Katherine Foster begin the structured treatment plan as follows:  She has agreed to the Category 2 Plan.  Exercise goals: No exercise has been  prescribed at this time.   Behavioral modification strategies: increasing lean protein intake, decreasing simple carbohydrates, increasing vegetables, increasing water intake, decreasing liquid calories, decreasing alcohol intake, decreasing sodium intake, and increasing high fiber foods.  She was informed of the importance of frequent follow-up visits to maximize her success with intensive lifestyle modifications for her multiple health conditions. She was informed we would discuss her lab results at her next visit unless there is a critical issue that needs to be addressed sooner. Katherine Foster agreed to keep her next visit at the agreed upon time to discuss these results.  Objective:   Blood pressure 107/68, pulse 66, temperature 98.2 F (36.8 C), temperature source Oral, height 5\' 4"  (1.626 m), weight 184 lb (83.5 kg), SpO2 96 %. Body mass index is 31.58 kg/m.  EKG: Normal sinus rhythm, rate 76 bpm.  Indirect Calorimeter completed today shows a VO2 of 249 and a REE of 1735.  Her calculated basal metabolic rate is 6789 thus her basal metabolic rate is better than expected.  General: Cooperative, alert, well developed, in no acute distress. HEENT: Conjunctivae and lids unremarkable. Cardiovascular: Regular rhythm.  Lungs: Normal work of breathing. Neurologic: No focal deficits.   Lab Results  Component Value Date   CREATININE 0.80 03/15/2021   BUN 12 03/15/2021   NA 137 03/15/2021   K 4.0 03/15/2021   CL 104 03/15/2021   CO2 23 03/15/2021   Lab Results  Component Value Date   ALT 21 03/15/2021   AST 15 03/15/2021   ALKPHOS 65 03/15/2021   BILITOT 0.6 03/15/2021   Lab Results  Component Value Date   HGBA1C 5.6 01/31/2021   Lab Results  Component Value Date   TSH 2.00 01/31/2021   Lab Results  Component Value Date   CHOL 219 (H) 01/31/2021   HDL 38 (L) 01/31/2021   LDLCALC 147 (H) 01/31/2021   TRIG 207 (H) 01/31/2021   CHOLHDL 5.8 (H) 01/31/2021   Lab Results   Component Value Date   WBC 12.4 (H) 03/15/2021   HGB 13.1 03/15/2021   HCT 38.6 03/15/2021   MCV 86.0 03/15/2021   PLT 284 03/15/2021   Attestation Statements:   This is the patient's first visit at [facility]. The patient's NEW PATIENT PACKET was reviewed at length. Included in the packet: current and past health history, medications, allergies, ROS, gynecologic history (women only), surgical history, family history, social history, weight history, weight loss surgery history (for those that have had weight loss surgery), nutritional evaluation, mood and food questionnaire, PHQ9, Epworth questionnaire, sleep habits  questionnaire, patient life and health improvement goals questionnaire. These will all be scanned into the patient's chart under media.   During the visit, I independently reviewed the patient's EKG, bioimpedance scale results, and indirect calorimeter results. I used this information to tailor a meal plan for the patient that will help her to lose weight and will improve her obesity-related conditions going forward. I performed a medically necessary appropriate examination and/or evaluation. I discussed the assessment and treatment plan with the patient. The patient was provided an opportunity to ask questions and all were answered. The patient agreed with the plan and demonstrated an understanding of the instructions. Labs were ordered at this visit and will be reviewed at the next visit unless more critical results need to be addressed immediately. Clinical information was updated and documented in the EMR.   I, Water quality scientist, CMA, am acting as transcriptionist for Briscoe Deutscher, DO  I have reviewed the above documentation for accuracy and completeness, and I agree with the above. Briscoe Deutscher, DO

## 2021-05-01 ENCOUNTER — Other Ambulatory Visit: Payer: Self-pay | Admitting: Family Medicine

## 2021-05-01 DIAGNOSIS — R454 Irritability and anger: Secondary | ICD-10-CM

## 2021-05-01 DIAGNOSIS — I10 Essential (primary) hypertension: Secondary | ICD-10-CM

## 2021-05-02 ENCOUNTER — Encounter (INDEPENDENT_AMBULATORY_CARE_PROVIDER_SITE_OTHER): Payer: Self-pay | Admitting: Family Medicine

## 2021-05-02 ENCOUNTER — Other Ambulatory Visit: Payer: Self-pay | Admitting: Unknown Physician Specialty

## 2021-05-02 ENCOUNTER — Other Ambulatory Visit: Payer: Self-pay

## 2021-05-02 ENCOUNTER — Other Ambulatory Visit: Payer: Self-pay | Admitting: Internal Medicine

## 2021-05-02 ENCOUNTER — Ambulatory Visit (INDEPENDENT_AMBULATORY_CARE_PROVIDER_SITE_OTHER): Payer: BC Managed Care – PPO | Admitting: Family Medicine

## 2021-05-02 VITALS — BP 110/70 | HR 77 | Temp 98.7°F | Ht 64.0 in | Wt 183.0 lb

## 2021-05-02 DIAGNOSIS — E782 Mixed hyperlipidemia: Secondary | ICD-10-CM | POA: Diagnosis not present

## 2021-05-02 DIAGNOSIS — M2550 Pain in unspecified joint: Secondary | ICD-10-CM

## 2021-05-02 DIAGNOSIS — Z9189 Other specified personal risk factors, not elsewhere classified: Secondary | ICD-10-CM

## 2021-05-02 DIAGNOSIS — E559 Vitamin D deficiency, unspecified: Secondary | ICD-10-CM | POA: Diagnosis not present

## 2021-05-02 DIAGNOSIS — I10 Essential (primary) hypertension: Secondary | ICD-10-CM | POA: Diagnosis not present

## 2021-05-02 DIAGNOSIS — Z6831 Body mass index (BMI) 31.0-31.9, adult: Secondary | ICD-10-CM

## 2021-05-02 DIAGNOSIS — E669 Obesity, unspecified: Secondary | ICD-10-CM

## 2021-05-02 NOTE — Telephone Encounter (Signed)
Medication Refill - Medication:   Vitamin D, Ergocalciferol, (DRISDOL) 1.25 MG (50000 UNIT) CAPS capsule  Has the patient contacted their pharmacy? Yes.  Pt has no refills left. Pt would like a call back if it will not be refilled.   Preferred Pharmacy (with phone number or street name):   [id number] - [address], [address]  [place] [address]  Phone: [phone] Fax: [phone]    Agent: Please be advised that RX refills may take up to 3 business days. We ask that you follow-up with your pharmacy.

## 2021-05-02 NOTE — Telephone Encounter (Signed)
Requested medication (s) are due for refill today: yes  Requested medication (s) are on the active medication list: yes  Last refill:  02/01/2021  Future visit scheduled:no  Notes to clinic:  50,000 IU strengths are not delegated   Requested Prescriptions  Pending Prescriptions Disp Refills   Vitamin D, Ergocalciferol, (DRISDOL) 1.25 MG (50000 UNIT) CAPS capsule [Pharmacy Med Name: VITAMIN D2 50,000IU (ERGO) CAP RX] 12 capsule 0    Sig: TAKE 1 CAPSULE BY MOUTH EVERY 7 DAYS      Endocrinology:  Vitamins - Vitamin D Supplementation Failed - 05/02/2021  1:40 PM      Failed - 50,000 IU strengths are not delegated      Failed - Phosphate in normal range and within 360 days    No results found for: PHOS        Failed - Vitamin D in normal range and within 360 days    Vit D, 25-Hydroxy  Date Value Ref Range Status  01/31/2021 13 (L) 30 - 100 ng/mL Final    Comment:    Vitamin D Status         25-OH Vitamin D: . Deficiency:                    <20 ng/mL Insufficiency:             20 - 29 ng/mL Optimal:                 > or = 30 ng/mL . For 25-OH Vitamin D testing on patients on  D2-supplementation and patients for whom quantitation  of D2 and D3 fractions is required, the [facility](TM) 25-OH VIT D, (D2,D3), LC/MS/MS is recommended: order  code [id number] (patients >59yrs). See Note 1 . Note 1 . For additional information, please refer to  http://education.[facility].com/faq/FAQ199  (This link is being provided for informational/ educational purposes only.)           Passed - Ca in normal range and within 360 days    Calcium  Date Value Ref Range Status  03/15/2021 8.9 8.9 - 10.3 mg/dL Final          Passed - Valid encounter within last 12 months    Recent Outpatient Visits           3 months ago Routine general medical examination at a health care facility   [place], NP   1 year ago [place], FNP   1 year ago Hypertension, unspecified type   [place], FNP   1 year ago Hypertension, unspecified type   [place], FNP   1 year ago Hypertension, unspecified type   [facility], Lupita Raider, [place]

## 2021-05-03 ENCOUNTER — Encounter: Payer: Self-pay | Admitting: Internal Medicine

## 2021-05-03 ENCOUNTER — Encounter (INDEPENDENT_AMBULATORY_CARE_PROVIDER_SITE_OTHER): Payer: Self-pay | Admitting: Family Medicine

## 2021-05-03 DIAGNOSIS — M2550 Pain in unspecified joint: Secondary | ICD-10-CM

## 2021-05-04 NOTE — Telephone Encounter (Signed)
Last OV with Dr Wallace 

## 2021-05-10 NOTE — Progress Notes (Signed)
Chief Complaint:   OBESITY Katherine Foster is here to discuss her progress with her obesity treatment plan along with follow-up of her obesity related diagnoses.   Today's visit was #: 2 Starting weight: 184 lbs Starting date: 04/18/2021 Today's weight: 183 lbs Today's date: 05/02/2021 Weight change since last visit: 1 lb Total lbs lost to date: 1 lb Body mass index is 31.41 kg/m.  Total weight loss percentage to date: -0.54%  Interim History:  Betania only had 1 soda in 2 weeks.  Still drinking sweet tea - 2 glasses per day.  She says she is ready to decrease to 1 glass per day. Current Meal Plan: the Category 2 Plan for 70% of the time.  Current Exercise Plan: None.  Assessment/Plan:   1. Multiple joint pain In knees and feet.  She says she has flat feet (plantar fasciitis).  Bilateral knee pain.  Will refer to Sports Medicine.  2. Vitamin D deficiency Not at goal.  She is taking vitamin D 50,000 IU weekly.  Plan: Continue to take prescription Vitamin D @50 ,000 IU every week as prescribed.  Follow-up for routine testing of Vitamin D, at least 2-3 times per year to avoid over-replacement.  Lab Results  Component Value Date   VD25OH 13 (L) 01/31/2021   3. Mixed hyperlipidemia Course: Not at goal. Lipid-lowering medications: None.   Plan: Dietary changes: Increase soluble fiber, decrease simple carbohydrates, decrease saturated fat. Exercise changes: Moderate to vigorous-intensity aerobic activity 150 minutes per week or as tolerated. We will continue to monitor along with PCP/specialists as it pertains to her weight loss journey.  Lab Results  Component Value Date   CHOL 219 (H) 01/31/2021   HDL 38 (L) 01/31/2021   LDLCALC 147 (H) 01/31/2021   TRIG 207 (H) 01/31/2021   CHOLHDL 5.8 (H) 01/31/2021   Lab Results  Component Value Date   ALT 21 03/15/2021   AST 15 03/15/2021   ALKPHOS 65 03/15/2021   BILITOT 0.6 03/15/2021   The 10-year ASCVD risk score Mikey Bussing DC Jr., et  al., 2013) is: 1.8%   Values used to calculate the score:     Age: 79 years     Sex: Female     Is Non-Hispanic African American: No     Diabetic: No     Tobacco smoker: No     Systolic Blood Pressure: 347 mmHg     Is BP treated: Yes     HDL Cholesterol: 38 mg/dL     Total Cholesterol: 219 mg/dL  4. Essential hypertension At goal. Medications: losartan 100 mg daily.   Plan: Avoid buying foods that are: processed, frozen, or prepackaged to avoid excess salt. We will watch for signs of hypotension as she continues lifestyle modifications.  BP Readings from Last 3 Encounters:  05/02/21 110/70  04/18/21 107/68  03/15/21 (!) 156/84   Lab Results  Component Value Date   CREATININE 0.80 03/15/2021   5. At risk for heart disease Due to Artavia's current state of health and medical condition(s), she is at a higher risk for heart disease.  This puts the patient at much greater risk to subsequently develop cardiopulmonary conditions that can significantly affect patient's quality of life in a negative manner.    At least 8 minutes were spent on counseling Amrutha about these concerns today. Evidence-based interventions for health behavior change were utilized today including the discussion of self monitoring techniques, problem-solving barriers, and SMART goal setting techniques.  Specifically, regarding patient's less  desirable eating habits and patterns, we employed the technique of small changes when [name] has not been able to fully commit to her prudent nutritional plan.  6. Obesity, current BMI 31.5  Course: Jerry is currently in the action stage of change. As such, her goal is to continue with weight loss efforts.   Nutrition goals: She has agreed to keeping a food journal and adhering to recommended goals of 1200 calories and 95 grams of protein.   Exercise goals:  Theraband exercises reviewed.  Behavioral modification strategies: increasing lean protein intake, decreasing simple  carbohydrates, increasing vegetables, and increasing water intake.  [name] has agreed to follow-up with our clinic in 3 weeks. She was informed of the importance of frequent follow-up visits to maximize her success with intensive lifestyle modifications for her multiple health conditions.   Objective:   Blood pressure 110/70, pulse 77, temperature 98.7 F (37.1 C), temperature source Oral, height 5\' 4"  (1.626 m), weight 183 lb (83 kg), SpO2 95 %. Body mass index is 31.41 kg/m.  General: Cooperative, alert, well developed, in no acute distress. HEENT: Conjunctivae and lids unremarkable. Cardiovascular: Regular rhythm.  Lungs: Normal work of breathing. Neurologic: No focal deficits.   Lab Results  Component Value Date   CREATININE 0.80 03/15/2021   BUN 12 03/15/2021   NA 137 03/15/2021   K 4.0 03/15/2021   CL 104 03/15/2021   CO2 23 03/15/2021   Lab Results  Component Value Date   ALT 21 03/15/2021   AST 15 03/15/2021   ALKPHOS 65 03/15/2021   BILITOT 0.6 03/15/2021   Lab Results  Component Value Date   HGBA1C 5.6 01/31/2021   Lab Results  Component Value Date   TSH 2.00 01/31/2021   Lab Results  Component Value Date   CHOL 219 (H) 01/31/2021   HDL 38 (L) 01/31/2021   LDLCALC 147 (H) 01/31/2021   TRIG 207 (H) 01/31/2021   CHOLHDL 5.8 (H) 01/31/2021   Lab Results  Component Value Date   VD25OH 13 (L) 01/31/2021   Lab Results  Component Value Date   WBC 12.4 (H) 03/15/2021   HGB 13.1 03/15/2021   HCT 38.6 03/15/2021   MCV 86.0 03/15/2021   PLT 284 03/15/2021   Attestation Statements:   Reviewed by clinician on day of visit: allergies, medications, problem list, medical history, surgical history, family history, social history, and previous encounter notes.  I, Water quality scientist, CMA, am acting as transcriptionist for Briscoe Deutscher, DO  I have reviewed the above documentation for accuracy and completeness, and I agree with the above. Briscoe Deutscher, DO

## 2021-05-15 ENCOUNTER — Ambulatory Visit: Payer: BC Managed Care – PPO | Admitting: Gastroenterology

## 2021-05-15 ENCOUNTER — Other Ambulatory Visit: Payer: Self-pay

## 2021-05-15 ENCOUNTER — Encounter: Payer: Self-pay | Admitting: Gastroenterology

## 2021-05-15 VITALS — BP 138/73 | HR 80 | Temp 98.2°F | Ht 64.0 in | Wt 186.6 lb

## 2021-05-15 DIAGNOSIS — K625 Hemorrhage of anus and rectum: Secondary | ICD-10-CM

## 2021-05-15 DIAGNOSIS — Z1211 Encounter for screening for malignant neoplasm of colon: Secondary | ICD-10-CM | POA: Diagnosis not present

## 2021-05-15 MED ORDER — SUTAB 1479-225-188 MG PO TABS: ORAL_TABLET | ORAL | 0 refills | Status: DC

## 2021-05-15 NOTE — Addendum Note (Signed)
Addended by: Lurlean Nanny on: 05/15/2021 02:52 PM   Modules accepted: Orders

## 2021-05-15 NOTE — Addendum Note (Signed)
Addended by: Lurlean Nanny on: 05/15/2021 04:40 PM   Modules accepted: Orders, SmartSet

## 2021-05-15 NOTE — Progress Notes (Signed)
Katherine Foster [address]  [place]  [place], [place] [address]  Main: [phone]  Fax: [phone]   Gastroenterology Consultation  Referring Provider:     No ref. provider found Primary Care Physician:  Jearld Fenton, NP Reason for Consultation:     bright red blood per rectum        HPI:    Chief Complaint  Patient presents with   New Patient (Initial Visit)   Hospitalization Follow-up   Rectal Bleeding    Noticed bright red blood in toilet x 3... 1-2 weeks apart... denies abd pain, N/V/D... having BM QD--normal...     Katherine Foster is a 45 y.o. y/o female referred for consultation & management  by Dr. Garnette Gunner, Coralie Keens, NP.  Patient went to the ER in May 2022 due to bright red blood per rectum for 4 days.  Describes having frank hematochezia for 4 to 5 days continuously prior to the ER visit.  Hemoglobin was normal in the ER with reassuring labs otherwise and patient was discharged with outpatient follow-up.  Since then she has had 2 other episodes of rectal bleeding, in May 2022 that lasted for 2 to 3 days and then since resolved.  Has not had another episode for over a month.  No nausea or vomiting.  No weight loss.  No dysphagia.  Reports family history of colon cancer in grandmother but no immediate family members with colon cancer.  No prior colonoscopy.  Prior to this, patient reports having blood streaks within the stool maybe once or twice throughout her lifetime.  Past Medical History:  Diagnosis Date   Chronic cough    Constipation    Hyperlipidemia    Hypertension    Joint pain    Lower extremity edema    Palpitations    SOB (shortness of breath) on exertion     Past Surgical History:  Procedure Laterality Date   BREAST BIOPSY Right 04/05/2020   [place] bx, coil marker, BENIGN MAMMARY PARENCHYMA   LASIK  11/06/2019   WISDOM TOOTH EXTRACTION      Prior to Admission medications   Medication Sig Start Date End Date Taking? Authorizing Provider   citalopram (CELEXA) 20 MG tablet TAKE 1 TABLET(20 MG) BY MOUTH DAILY. FOLLOW-UP APPOINTMENT AS NEEDED FOR ANXIETY 05/02/21  Yes Baity, Coralie Keens, NP  gabapentin (NEURONTIN) 300 MG capsule Take 1 capsule (300 mg total) by mouth 3 (three) times daily. 02/24/20  Yes Malfi, Lupita Raider, FNP  losartan (COZAAR) 100 MG tablet TAKE 1 TABLET(100 MG) BY MOUTH DAILY 05/02/21  Yes Baity, Coralie Keens, NP  rizatriptan (MAXALT) 5 MG tablet Take 5 mg by mouth as needed for migraine. May repeat in 2 hours if needed   Yes [provider]    Family History  Problem Relation Age of Onset   Heart disease Mother    Hypertension Mother    Diabetes Mother    Diabetes Mellitus II Mother    Hypothyroidism Mother    Depression Mother    Anxiety disorder Mother    Liver disease Mother    Obesity Mother    Obesity Father    Thyroid disease Father    Hyperlipidemia Father    Hypertension Father    Diabetes Father    Heart disease Father    Heart attack Father    Diabetes Mellitus II Father    Sudden death Father    Breast cancer Maternal Grandmother    Cancer Maternal Grandmother  Diabetes Maternal Grandfather    Heart attack Paternal Grandfather      Social History   Tobacco Use   Smoking status: Never   Smokeless tobacco: Never  Vaping Use   Vaping Use: Never used  Substance Use Topics   Alcohol use: Yes   Drug use: Never    Allergies as of 05/15/2021 - Review Complete 05/15/2021  Allergen Reaction Noted   Sulfa antibiotics  07/14/2015    Review of Systems:    All systems reviewed and negative except where noted in HPI.   Physical Exam:  Constitutional: General:   Alert,  Well-developed, well-nourished, pleasant and cooperative in NAD BP 138/73   Pulse 80   Temp 98.2 F (36.8 C) (Oral)   Wt 186 lb 9.6 oz (84.6 kg)   LMP  (LMP Unknown)   BMI 32.03 kg/m   Eyes:  Sclera clear, no icterus.   Conjunctiva pink. PERRLA  Ears:  No scars, lesions or masses, Normal auditory  acuity. Nose:  No deformity, discharge, or lesions. Mouth:  No deformity or lesions, oropharynx pink & moist.  Neck:  Supple; no masses or thyromegaly.  Respiratory: Normal respiratory effort, Normal percussion  Gastrointestinal:  No bruits.  Soft, non-tender and non-distended without masses, hepatosplenomegaly or hernias noted.  No guarding or rebound tenderness.     Cardiac: No clubbing or edema.  No cyanosis. Normal posterior tibial pedal pulses noted.  Lymphatic:  No significant cervical or axillary adenopathy.  Psych:  Alert and cooperative. Normal mood and affect.  Musculoskeletal:  Normal gait. Head normocephalic, atraumatic. Symmetrical without gross deformities. 5/5 Upper and Lower extremity strength bilaterally.  Skin: Warm. Intact without significant lesions or rashes. No jaundice.  Neurologic:  Face symmetrical, tongue midline, Normal sensation to touch;  grossly normal neurologically.  Psych:  Alert and oriented x3, Alert and cooperative. Normal mood and affect.   Labs: CBC    Component Value Date/Time   WBC 12.4 (H) 03/15/2021 1018   RBC 4.49 03/15/2021 1018   HGB 13.1 03/15/2021 1018   HCT 38.6 03/15/2021 1018   PLT 284 03/15/2021 1018   MCV 86.0 03/15/2021 1018   MCH 29.2 03/15/2021 1018   MCHC 33.9 03/15/2021 1018   RDW 12.4 03/15/2021 1018   LYMPHSABS 2,888 01/31/2021 1002   EOSABS 452 01/31/2021 1002   BASOSABS 84 01/31/2021 1002   CMP     Component Value Date/Time   NA 137 03/15/2021 1018   K 4.0 03/15/2021 1018   CL 104 03/15/2021 1018   CO2 23 03/15/2021 1018   GLUCOSE 86 03/15/2021 1018   BUN 12 03/15/2021 1018   CREATININE 0.80 03/15/2021 1018   CREATININE 0.87 01/31/2021 1002   CALCIUM 8.9 03/15/2021 1018   PROT 7.3 03/15/2021 1018   ALBUMIN 4.0 03/15/2021 1018   AST 15 03/15/2021 1018   ALT 21 03/15/2021 1018   ALKPHOS 65 03/15/2021 1018   BILITOT 0.6 03/15/2021 1018   GFRNONAA >60 03/15/2021 1018   GFRNONAA 84 12/29/2019 1031    GFRAA 97 12/29/2019 1031    Imaging Studies:   Assessment and Plan:   Katherine Foster is a 45 y.o. y/o female has been referred for bright red blood per rectum in May 2022 with no further episodes since then  Further colonoscopy indicated this time both for screening and for further evaluation of bright red blood per rectum  Differentials include hemorrhoids versus AVMs, versus large polyp versus colon cancer  With assuring hemoglobin, electrolytes and liver enzymes,  hemodynamic status, no indication for urgent colonoscopy.  However, we will schedule in the next few weeks  I have discussed alternative options, risks & benefits,  which include, but are not limited to, bleeding, infection, perforation,respiratory complication & drug reaction.  The patient agrees with this plan & written consent will be obtained.     Dr Katherine Foster  Speech recognition software was used to dictate the above note.

## 2021-05-16 ENCOUNTER — Other Ambulatory Visit: Payer: Self-pay

## 2021-05-16 ENCOUNTER — Other Ambulatory Visit: Payer: BC Managed Care – PPO

## 2021-05-16 DIAGNOSIS — E559 Vitamin D deficiency, unspecified: Secondary | ICD-10-CM | POA: Diagnosis not present

## 2021-05-16 LAB — VITAMIN D 25 HYDROXY (VIT D DEFICIENCY, FRACTURES): Vit D, 25-Hydroxy: 51 ng/mL (ref 30–100)

## 2021-05-23 ENCOUNTER — Encounter (INDEPENDENT_AMBULATORY_CARE_PROVIDER_SITE_OTHER): Payer: Self-pay | Admitting: Adult Health

## 2021-05-23 ENCOUNTER — Ambulatory Visit (INDEPENDENT_AMBULATORY_CARE_PROVIDER_SITE_OTHER): Payer: BC Managed Care – PPO | Admitting: Adult Health

## 2021-05-23 ENCOUNTER — Other Ambulatory Visit: Payer: Self-pay

## 2021-05-23 VITALS — BP 121/75 | HR 83 | Temp 98.5°F | Ht 64.0 in | Wt 180.0 lb

## 2021-05-23 DIAGNOSIS — I10 Essential (primary) hypertension: Secondary | ICD-10-CM | POA: Diagnosis not present

## 2021-05-23 DIAGNOSIS — M2550 Pain in unspecified joint: Secondary | ICD-10-CM

## 2021-05-23 DIAGNOSIS — Z6831 Body mass index (BMI) 31.0-31.9, adult: Secondary | ICD-10-CM | POA: Diagnosis not present

## 2021-05-23 DIAGNOSIS — E669 Obesity, unspecified: Secondary | ICD-10-CM

## 2021-05-24 MED ORDER — NA SULFATE-K SULFATE-MG SULF 17.5-3.13-1.6 GM/177ML PO SOLN: 1.0000 | Freq: Once | ORAL | 0 refills | Status: AC

## 2021-05-25 DIAGNOSIS — E6609 Other obesity due to excess calories: Secondary | ICD-10-CM | POA: Insufficient documentation

## 2021-05-25 DIAGNOSIS — M2550 Pain in unspecified joint: Secondary | ICD-10-CM | POA: Insufficient documentation

## 2021-05-25 DIAGNOSIS — E669 Obesity, unspecified: Secondary | ICD-10-CM | POA: Insufficient documentation

## 2021-05-25 NOTE — Progress Notes (Signed)
Chief Complaint:   OBESITY Katherine Foster is here to discuss her progress with her obesity treatment plan along with follow-up of her obesity related diagnoses. Katherine Foster is on keeping a food journal and adhering to recommended goals of 1200 calories and 95 grams protein and states she is following her eating plan approximately 70% of the time. Katherine Foster states she is not currently exercising.  Today's visit was #: 3 Starting weight: 184 lbs Starting date: 04/18/2021 Today's weight: 180 lbs Today's date: 05/23/2021 Total lbs lost to date: 4 Total lbs lost since last in-office visit: 3  Interim History: Katherine Foster has eliminated daily Coke intake. She had only 1 coke with popcorn once. She has decreased daily sweet tea per day and does not request To-Go cup anymore- Excellent!  Subjective:   1. Essential hypertension BP/HR excellent at OV. Katherine Foster is on losartan 100 mg QD. She denies history of dizziness.   2. Multiple joint pain Katherine Foster has bilateral knee pain that has been intermittent since age 16. 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 She has flat feet but has never been diagnosed with plantar fasciitis.  Assessment/Plan:   1. Essential hypertension Katherine Foster is working on healthy weight loss and exercise to improve blood pressure control. We will watch for signs of hypotension as she continues her lifestyle modifications. Monitor for symptoms of hypotension.  2. Multiple joint pain Keep follow up with orthopedic specialist, Dr. Georgina Snell on 2 Aug 22.  3. Obesity, current BMI 310.9  Katherine Foster is currently in the action stage of change. As such, her goal is to continue with weight loss efforts. She has agreed to keeping a food journal and adhering to recommended goals of 1200 calories and 95 grams protein.   Try 50/50- sweet/unsweet tea 1 per day.  Exercise goals:  As is  Behavioral modification strategies: increasing lean protein intake, decreasing simple  carbohydrates, decreasing liquid calories, meal planning and cooking strategies, keeping healthy foods in the home, planning for success, and keeping a strict food journal.  Katherine Foster has agreed to follow-up with our clinic in 3 weeks. She was informed of the importance of frequent follow-up visits to maximize her success with intensive lifestyle modifications for her multiple health conditions.   Objective:   Blood pressure 121/75, pulse 83, temperature 98.5 F (36.9 C), height '5\' 4"'$  (1.626 m), weight 180 lb (81.6 kg), SpO2 97 %. Body mass index is 30.9 kg/m.  General: Cooperative, alert, well developed, in no acute distress. HEENT: Conjunctivae and lids unremarkable. Cardiovascular: Regular rhythm.  Lungs: Normal work of breathing. Neurologic: No focal deficits.   Lab Results  Component Value Date   CREATININE 0.80 03/15/2021   BUN 12 03/15/2021   NA 137 03/15/2021   K 4.0 03/15/2021   CL 104 03/15/2021   CO2 23 03/15/2021   Lab Results  Component Value Date   ALT 21 03/15/2021   AST 15 03/15/2021   ALKPHOS 65 03/15/2021   BILITOT 0.6 03/15/2021   Lab Results  Component Value Date   HGBA1C 5.6 01/31/2021   No results found for: INSULIN Lab Results  Component Value Date   TSH 2.00 01/31/2021   Lab Results  Component Value Date   CHOL 219 (H) 01/31/2021   HDL 38 (L) 01/31/2021   LDLCALC 147 (H) 01/31/2021   TRIG 207 (H) 01/31/2021   CHOLHDL 5.8 (H) 01/31/2021   Lab Results  Component Value Date   VD25OH 51 05/16/2021   VD25OH 13 (L)  01/31/2021   Lab Results  Component Value Date   WBC 12.4 (H) 03/15/2021   HGB 13.1 03/15/2021   HCT 38.6 03/15/2021   MCV 86.0 03/15/2021   PLT 284 03/15/2021   No results found for: IRON, TIBC, FERRITIN   Attestation Statements:   Reviewed by clinician on day of visit: allergies, medications, problem list, medical history, surgical history, family history, social history, and previous encounter notes.  Coral Ceo, CMA, am acting as transcriptionist for Mina Marble, NP.  I have reviewed the above documentation for accuracy and completeness, and I agree with the above. -  Raju Coppolino d. Chasity Outten, NP-C

## 2021-05-30 ENCOUNTER — Ambulatory Visit: Payer: BC Managed Care – PPO | Admitting: Family Medicine

## 2021-05-30 NOTE — Progress Notes (Signed)
Subjective:    I'm seeing this patient as a consultation for:  Dr. Juleen China. Note will be routed back to referring provider/PCP.  CC: B knee and foot pain  I, Molly Weber, LAT, ATC, am serving as scribe for Dr. Lynne Leader.  HPI: Pt is a 45 y/o female presenting w/ B knee and foot pain.  Knee pain: Present since she was 11. MOI: Pt has experienced L patellar dislocations 3-4 times, 2 years ago. Pt is also experiencing R knee pain due to compensating.  She locates pain to the anterior aspect of L knee, but really only experiences pain going down steps. Pt notes slight "achy" pain when first getting out of bed in the mornings. -Swelling: no -Mechanical symptoms: yes -Aggravating factors: down stairs -Treatments tried: none  Foot pain: Present since she was born. Pt was a delayed walker, not learning to walk till after 45 y/o. Pt reports she has pes planus. Pt locates pain to the medial aspect of L foot. Pt notes increased pain first thing in morning. -Aggravating factors: AM -Treatments tried: orthotics  Past medical history, Surgical history, Family history, Social history, Allergies, and medications have been entered into the medical record, reviewed.   Review of Systems: No new headache, visual changes, nausea, vomiting, diarrhea, constipation, dizziness, abdominal pain, skin rash, fevers, chills, night sweats, weight loss, swollen lymph nodes, body aches, joint swelling, muscle aches, chest pain, shortness of breath, mood changes, visual or auditory hallucinations.   Objective:    Vitals:   05/31/21 0845  BP: 138/86  Pulse: 90  SpO2: 97%   General: Well Developed, well nourished, and in no acute distress.  Neuro/Psych: Alert and oriented x3, extra-ocular muscles intact, able to move all 4 extremities, sensation grossly intact. Skin: Warm and dry, no rashes noted.  Respiratory: Not using accessory muscles, speaking in full sentences, trachea midline.  Cardiovascular: Pulses  palpable, no extremity edema. Abdomen: Does not appear distended. MSK: Left knee decreased quadricep bulk. Normal knee motion with crepitation.  Tender palpation medial joint line. Positive patellar apprehension test. Intact strength pain with anterior knee with resisted knee extension. Stable ligamentous exam Pulses cap refill and sensation are intact distally.  Right knee slight decreased quad bulk. Normal motion with crepitation.  Nontender normal strength stable ligamentous exam  Left foot significant pes planus with ankle pronation and subluxation medially.  Decreased heel valgus with normal toe standing. Foot is nontender.  Right foot mild pes planus.  Nontender.   Lab and Radiology Results  X-ray images bilateral knees obtained today personally and independently interpreted  Left knee mild DJD.  No acute fractures.  Right knee: Mild DJD.  No acute fractures.  Await formal radiology review  Impression and Recommendations:    Assessment and Plan: 45 y.o. female with bilateral knee pain left worse than right.  Patient is experienced several left knee patellar subluxation events.  Patient has chondromalacia patella on physical exam bilaterally and decreased VMO bulk bilaterally.  Plan for trial of physical therapy as well as NSAID/Voltaren gel.  Recheck in about 1 month  Foot pain bilaterally left worse than right.  Patient has significant pes planus and ankle subluxation medially.  Plan for home exercise program working on posterior tibialis tendon and scaphoid pads to help stabilize the ankle.  Additionally recommend compression ankle sleeve.  Recheck back in about a month.  PDMP not reviewed this encounter. Orders Placed This Encounter  Procedures   DG Knee AP/LAT W/Sunrise Left  Standing Status:   Future    Number of Occurrences:   1    Standing Expiration Date:   05/31/2022    Order Specific Question:   Reason for Exam (SYMPTOM  OR DIAGNOSIS REQUIRED)    Answer:    bilateral knee pain    Order Specific Question:   Preferred imaging location?    Answer:   [facility]    Order Specific Question:   Is patient pregnant?    Answer:   No   DG Knee AP/LAT W/Sunrise Right    Standing Status:   Future    Number of Occurrences:   1    Standing Expiration Date:   05/31/2022    Order Specific Question:   Reason for Exam (SYMPTOM  OR DIAGNOSIS REQUIRED)    Answer:   bilateral knee pain    Order Specific Question:   Preferred imaging location?    Answer:   [facility]    Order Specific Question:   Is patient pregnant?    Answer:   No   Ambulatory referral to Physical Therapy    Referral Priority:   Routine    Referral Type:   Physical Medicine    Referral Reason:   Specialty Services Required    Requested Specialty:   Physical Therapy    Number of Visits Requested:   1   Meds ordered this encounter  Medications   Diclofenac Sodium (PENNSAID) 2 % SOLN    Sig: Place 1 application onto the skin 2 (two) times daily.    Dispense:  112 g    Refill:  2    Home Phone      [phone] Mobile          [phone]    Discussed warning signs or symptoms. Please see discharge instructions. Patient expresses understanding.   The above documentation has been reviewed and is accurate and complete Lynne Leader, M.D.

## 2021-05-31 ENCOUNTER — Other Ambulatory Visit: Payer: Self-pay

## 2021-05-31 ENCOUNTER — Ambulatory Visit: Payer: BC Managed Care – PPO | Admitting: Family Medicine

## 2021-05-31 ENCOUNTER — Ambulatory Visit (INDEPENDENT_AMBULATORY_CARE_PROVIDER_SITE_OTHER): Payer: BC Managed Care – PPO

## 2021-05-31 ENCOUNTER — Ambulatory Visit: Payer: Self-pay

## 2021-05-31 VITALS — BP 138/86 | HR 90 | Ht 64.0 in | Wt 181.4 lb

## 2021-05-31 DIAGNOSIS — S83005S Unspecified dislocation of left patella, sequela: Secondary | ICD-10-CM | POA: Diagnosis not present

## 2021-05-31 DIAGNOSIS — M25561 Pain in right knee: Secondary | ICD-10-CM | POA: Diagnosis not present

## 2021-05-31 DIAGNOSIS — G8929 Other chronic pain: Secondary | ICD-10-CM | POA: Diagnosis not present

## 2021-05-31 DIAGNOSIS — M25562 Pain in left knee: Secondary | ICD-10-CM

## 2021-05-31 DIAGNOSIS — M79671 Pain in right foot: Secondary | ICD-10-CM | POA: Diagnosis not present

## 2021-05-31 DIAGNOSIS — M79672 Pain in left foot: Secondary | ICD-10-CM

## 2021-05-31 MED ORDER — DICLOFENAC SODIUM 2 % EX SOLN: 1.0000 "application " | Freq: Two times a day (BID) | CUTANEOUS | 2 refills | Status: DC

## 2021-05-31 NOTE — Patient Instructions (Addendum)
Thank you for coming in today.   Please get an Xray today before you leave   Please complete the exercises that the athletic trainer went over with you:  View at [email or web address] using code: [email or web address]  I've referred you to Physical Therapy.  Let us know if you don't hear from them in one week.   I recommend you obtained a compression sleeve to help with your joint problems. There are many options on the market however I recommend obtaining a full ankle  Body Helix compression sleeve.  You can find information (including how to appropriate measure yourself for sizing) can be found at www.Body http://www.lambert.com/.  Many of these products are health savings account (HSA) eligible.   You can use the compression sleeve at any time throughout the day but is most important to use while being active as well as for 2 hours post-activity.   It is appropriate to ice following activity with the compression sleeve in place.   Try the pennsiad or voltaren gel.   Pennsaid instructions: You have been given a sample/prescription for Pennsaid, a topical medication.    You are to apply this gel to your injured body part twice daily (morning and evening).  A little goes a long way so you can use about a pea-sized amount for each area.  Spread this small amount over the area into a thin film and let it dry.  Be sure that you do not rub the gel into your skin for more than 10 or 15 seconds otherwise it can irritate you skin.   Once you apply the gel, please do not put any other lotion or clothing in contact with that area for 30 minutes to allow the gel to absorb into your skin.  Some people are sensitive to the medication and can develop a sunburn-like rash.  If you have only mild symptoms it is okay to continue to use the medication but if you have any breakdown of your skin you should discontinue its use and please let us know.  If you have been written a prescription for Pennsaid, you will receive a pump bottle of  this topical gel through a mail order pharmacy.  The instructions on the bottle will say to apply two pumps twice a day which may be too much gel for your particular area so use the pea-sized amount as your guide.  Instructions for Duexis, Pennsaid and Vimovo:  Your prescription will be filled through a participating [facility] mail order pharmacy.  You will receive a phone call or text from one of the participating pharmacies which can be located in any state in the [place].  You must communicate directly with them to have this medication filled.  When the pharmacy contacts you, they will need your mailing address (for shipment of the medication) andy they will need payment information if you have a copay (typically no more than $10). If you have not heard from them 2-3 days after your appointment with Dr. Georgina Snell, contact [facility] directly at [phone].   Recheck in 1 month.   Try the scaphoid pads.

## 2021-06-02 DIAGNOSIS — R051 Acute cough: Secondary | ICD-10-CM | POA: Diagnosis not present

## 2021-06-02 DIAGNOSIS — U071 COVID-19: Secondary | ICD-10-CM | POA: Diagnosis not present

## 2021-06-02 NOTE — Progress Notes (Signed)
Left knee x-ray looks normal to radiology

## 2021-06-02 NOTE — Progress Notes (Signed)
Right knee x-ray looks normal to radiology

## 2021-06-07 ENCOUNTER — Telehealth: Payer: Self-pay | Admitting: Gastroenterology

## 2021-06-07 NOTE — Telephone Encounter (Signed)
Called and left a message for call back  

## 2021-06-07 NOTE — Telephone Encounter (Signed)
Patient wants to change her procedure date. Clinical staff will follow up with patient.

## 2021-06-08 ENCOUNTER — Other Ambulatory Visit: Payer: Self-pay

## 2021-06-08 NOTE — Progress Notes (Signed)
Returned patients call. Patient requested to r/s procedure date. Procedure has been moved to 9/20. Endo unit ([facility]) was notified of change and updated instructions will be sent via my chart and mailed.

## 2021-06-12 NOTE — Telephone Encounter (Signed)
Procedure already r/s to 9/20

## 2021-06-13 ENCOUNTER — Ambulatory Visit (INDEPENDENT_AMBULATORY_CARE_PROVIDER_SITE_OTHER): Payer: BC Managed Care – PPO | Admitting: Adult Health

## 2021-06-21 DIAGNOSIS — M25561 Pain in right knee: Secondary | ICD-10-CM | POA: Diagnosis not present

## 2021-06-21 DIAGNOSIS — M25562 Pain in left knee: Secondary | ICD-10-CM | POA: Diagnosis not present

## 2021-06-28 DIAGNOSIS — M25561 Pain in right knee: Secondary | ICD-10-CM | POA: Diagnosis not present

## 2021-06-28 DIAGNOSIS — M25562 Pain in left knee: Secondary | ICD-10-CM | POA: Diagnosis not present

## 2021-07-04 NOTE — Progress Notes (Signed)
   I, Wendy Poet, LAT, ATC, am serving as scribe for Dr. Lynne Leader.  Katherine Foster is a 45 y.o. female who presents to [place] at [facility] today for f/u of chronic B knee and foot pain.  She was last seen by Dr. Georgina Snell on 05/31/21 and was shown a HEP and referred to PT at [facility], completing 2 visits (3rd session later today).  She was advised to purchase an ankle compression sleeve and to use Voltaren gel.  Today, pt reports knees are fine, unless she is going up /down stairs.  Pt notes a lot of mechanical symptoms going on in her knees. Pt reports her feet are only bothering her when she is walking a good bit.  She notes a burning sensation in the plantar aspect of her left foot.  Diagnostic testing: R and L knee XR- 05/31/21   Pertinent review of systems: No fevers or chills  Relevant historical information: History of pes planus.  Has tried custom orthotics in the past.   Exam:  BP 108/76   Pulse 76   Ht '5\' 4"'$  (1.626 m)   Wt 182 lb 6.4 oz (82.7 kg)   SpO2 95%   BMI 31.31 kg/m  General: Well Developed, well nourished, and in no acute distress.   MSK: Left knee normal-appearing normal motion.  Tender palpation medial joint line. Stable ligamentous exam. Feet bilaterally significant pes planus worse on the left. Foot is mildly tender palpation plantar midfoot. Normal foot and ankle motion. Stable ligamentous exam.    Lab and Radiology Results  EXAM: LEFT KNEE 3 VIEWS   COMPARISON:  None.   FINDINGS: No evidence of fracture, dislocation, or joint effusion. Normal alignment and joint spaces. No evidence of arthropathy or other focal bone abnormality. Soft tissues are unremarkable.   IMPRESSION: Unremarkable radiographs of the left knee.     Electronically Signed   By: Keith Rake M.D.   On: 06/01/2021 16:29 I, Lynne Leader, personally (independently) visualized and performed the interpretation of the images attached in this  note.     Assessment and Plan: 45 y.o. female with knee pain.  Plan to correct patella alto and some patellar tracking issues with physical therapy.  Already improving a bit.  Recheck in about 6 weeks.  If not improved would consider steroid injection.  Discussed the possibility of a patellar stabilizing brace.  She notes that she will find this uncomfortable and probably will not wear it.  Additionally discussed her foot pain.  She has had trials of custom orthotics in the past and found them uncomfortable some little dubious about doing that again.  For now recommend good comfortable footwear.  I think she will probably have some benefit with a pronation control sneaker at [employer] with good support of over-the-counter/after market insole such as super feet.  Continue stabilizing exercises and physical therapy and taught in clinic previously with ATC. Recheck in 6 weeks.  If not improved consider steroid injection x-rays MRIs or even possibly nerve conduction study to assess for neuropathy.     Discussed warning signs or symptoms. Please see discharge instructions. Patient expresses understanding.   The above documentation has been reviewed and is accurate and complete Lynne Leader, M.D.  Total encounter time 30 minutes including face-to-face time with the patient and, reviewing past medical record, and charting on the date of service.   Plan and options

## 2021-07-05 ENCOUNTER — Other Ambulatory Visit: Payer: Self-pay

## 2021-07-05 ENCOUNTER — Ambulatory Visit: Payer: BC Managed Care – PPO | Admitting: Family Medicine

## 2021-07-05 VITALS — BP 108/76 | HR 76 | Ht 64.0 in | Wt 182.4 lb

## 2021-07-05 DIAGNOSIS — M25561 Pain in right knee: Secondary | ICD-10-CM | POA: Diagnosis not present

## 2021-07-05 DIAGNOSIS — M79672 Pain in left foot: Secondary | ICD-10-CM

## 2021-07-05 DIAGNOSIS — G8929 Other chronic pain: Secondary | ICD-10-CM

## 2021-07-05 DIAGNOSIS — M79671 Pain in right foot: Secondary | ICD-10-CM | POA: Diagnosis not present

## 2021-07-05 DIAGNOSIS — M25562 Pain in left knee: Secondary | ICD-10-CM | POA: Diagnosis not present

## 2021-07-05 NOTE — Patient Instructions (Signed)
Thank you for coming in today.   Finish out PT.   Recheck in 6 weeks.   If you feet do no improve there is more for me to do .   OTC arch supporting insoles like superfeet would be good.   Fleet feet could help select some shoes.

## 2021-07-06 ENCOUNTER — Other Ambulatory Visit: Payer: Self-pay

## 2021-07-06 ENCOUNTER — Ambulatory Visit: Payer: BC Managed Care – PPO | Admitting: Internal Medicine

## 2021-07-06 ENCOUNTER — Encounter (INDEPENDENT_AMBULATORY_CARE_PROVIDER_SITE_OTHER): Payer: Self-pay | Admitting: Family Medicine

## 2021-07-06 ENCOUNTER — Ambulatory Visit (INDEPENDENT_AMBULATORY_CARE_PROVIDER_SITE_OTHER): Payer: BC Managed Care – PPO | Admitting: Family Medicine

## 2021-07-06 VITALS — BP 129/73 | HR 76 | Temp 98.1°F | Ht 64.0 in | Wt 179.0 lb

## 2021-07-06 DIAGNOSIS — Z9189 Other specified personal risk factors, not elsewhere classified: Secondary | ICD-10-CM

## 2021-07-06 DIAGNOSIS — Z6831 Body mass index (BMI) 31.0-31.9, adult: Secondary | ICD-10-CM

## 2021-07-06 DIAGNOSIS — M2550 Pain in unspecified joint: Secondary | ICD-10-CM | POA: Diagnosis not present

## 2021-07-06 DIAGNOSIS — I10 Essential (primary) hypertension: Secondary | ICD-10-CM | POA: Diagnosis not present

## 2021-07-06 DIAGNOSIS — E782 Mixed hyperlipidemia: Secondary | ICD-10-CM | POA: Diagnosis not present

## 2021-07-06 DIAGNOSIS — E559 Vitamin D deficiency, unspecified: Secondary | ICD-10-CM | POA: Diagnosis not present

## 2021-07-06 DIAGNOSIS — E669 Obesity, unspecified: Secondary | ICD-10-CM

## 2021-07-06 MED ORDER — TIRZEPATIDE 2.5 MG/0.5ML ~~LOC~~ SOAJ: 2.5000 mg | SUBCUTANEOUS | 0 refills | Status: DC

## 2021-07-11 ENCOUNTER — Other Ambulatory Visit: Payer: Self-pay

## 2021-07-11 ENCOUNTER — Ambulatory Visit: Payer: BC Managed Care – PPO | Admitting: Internal Medicine

## 2021-07-11 ENCOUNTER — Encounter: Payer: Self-pay | Admitting: Internal Medicine

## 2021-07-11 VITALS — BP 121/68 | HR 71 | Temp 97.3°F | Resp 17 | Ht 64.0 in | Wt 183.8 lb

## 2021-07-11 DIAGNOSIS — Z6831 Body mass index (BMI) 31.0-31.9, adult: Secondary | ICD-10-CM | POA: Diagnosis not present

## 2021-07-11 DIAGNOSIS — E6609 Other obesity due to excess calories: Secondary | ICD-10-CM

## 2021-07-11 DIAGNOSIS — N898 Other specified noninflammatory disorders of vagina: Secondary | ICD-10-CM

## 2021-07-11 DIAGNOSIS — N907 Vulvar cyst: Secondary | ICD-10-CM | POA: Diagnosis not present

## 2021-07-11 NOTE — Progress Notes (Signed)
Chief Complaint:   OBESITY Katherine Foster is here to discuss her progress with her obesity treatment plan along with follow-up of her obesity related diagnoses.   Today's visit was #: 4 Starting weight: 184 lbs Starting date: 04/18/2021 Today's weight: 179 lbs Today's date: 07/06/2021 Weight change since last visit: 1 lb Total lbs lost to date: 5 lbs Body mass index is 30.73 kg/m.  Total weight loss percentage to date: -2.72%  Current Meal Plan: keeping a food journal and adhering to recommended goals of 1200 calories and 95 grams of protein for 0% of the time.  Current Exercise Plan: Increased activity.  Interim History:  Katherine Foster endorses polyphagia.  Assessment/Plan:   1. Mixed hyperlipidemia Course: Not at goal. Lipid-lowering medications: None.   Plan: Dietary changes: Increase soluble fiber, decrease simple carbohydrates, decrease saturated fat. Exercise changes: Moderate to vigorous-intensity aerobic activity 150 minutes per week or as tolerated. We will continue to monitor along with PCP/specialists as it pertains to her weight loss journey.  Lab Results  Component Value Date   CHOL 219 (H) 01/31/2021   HDL 38 (L) 01/31/2021   LDLCALC 147 (H) 01/31/2021   TRIG 207 (H) 01/31/2021   CHOLHDL 5.8 (H) 01/31/2021   Lab Results  Component Value Date   ALT 21 03/15/2021   AST 15 03/15/2021   ALKPHOS 65 03/15/2021   BILITOT 0.6 03/15/2021   The 10-year ASCVD risk score (Arnett DK, et al., 2019) is: 2.4%   Values used to calculate the score:     Age: 45 years     Sex: Female     Is Non-Hispanic African American: No     Diabetic: No     Tobacco smoker: No     Systolic Blood Pressure: [id number] mmHg     Is BP treated: Yes     HDL Cholesterol: 38 mg/dL     Total Cholesterol: 219 mg/dL  - Start tirzepatide [facility]) 2.5 MG/0.5ML Pen; Inject 2.5 mg into the skin once a week.  Dispense: 2 mL; Refill: 0  2. Vitamin D deficiency At goal.   Plan: Follow-up for routine testing  of Vitamin D, at least 2-3 times per year to avoid over-replacement.  Lab Results  Component Value Date   VD25OH 51 05/16/2021   VD25OH 13 (L) 01/31/2021   3. Essential hypertension At goal. Medications: Cozaar 100 mg daily.   Plan: Avoid buying foods that are: processed, frozen, or prepackaged to avoid excess salt. We will watch for signs of hypotension as she continues lifestyle modifications.  BP Readings from Last 3 Encounters:  07/06/21 129/73  07/05/21 108/76  05/31/21 138/86   Lab Results  Component Value Date   CREATININE 0.80 03/15/2021   4. Multiple joint pain Katherine Foster has bilateral knee pain that has been intermittent since age 58. 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 She has flat feet but has never been diagnosed with plantar fasciitis.  5. At risk for nausea Katherine Foster is at risk for nausea due to starting [facility]. We reviewed ways to decrease side effect of nausea through eating small meals, avoiding high sugar or fat foods, and ultimately going back on the dose if not tolerating. Time > 8 minutes.  6. Class 1 obesity with serious comorbidity and body mass index (BMI) of 31.0 to 31.9 in adult, unspecified obesity type  Course: Katherine Foster is currently in the action stage of change. As such, her goal is to continue with weight loss efforts.  Nutrition goals: She has agreed to keeping a food journal and adhering to recommended goals of 1200 calories and 95 grams of protein.   Exercise goals: All adults should avoid inactivity. Some physical activity is better than none, and adults who participate in any amount of physical activity gain some health benefits.  Behavioral modification strategies: increasing lean protein intake, decreasing simple carbohydrates, increasing vegetables, increasing water intake, decreasing liquid calories, and emotional eating strategies.  Katherine Foster has agreed to follow-up with our clinic in 3 weeks. She was informed of the  importance of frequent follow-up visits to maximize her success with intensive lifestyle modifications for her multiple health conditions.   Objective:   Blood pressure 129/73, pulse 76, temperature 98.1 F (36.7 C), temperature source Oral, height '5\' 4"'$  (1.626 m), weight 179 lb (81.2 kg), SpO2 94 %. Body mass index is 30.73 kg/m.  General: Cooperative, alert, well developed, in no acute distress. HEENT: Conjunctivae and lids unremarkable. Cardiovascular: Regular rhythm.  Lungs: Normal work of breathing. Neurologic: No focal deficits.   Lab Results  Component Value Date   CREATININE 0.80 03/15/2021   BUN 12 03/15/2021   NA 137 03/15/2021   K 4.0 03/15/2021   CL 104 03/15/2021   CO2 23 03/15/2021   Lab Results  Component Value Date   ALT 21 03/15/2021   AST 15 03/15/2021   ALKPHOS 65 03/15/2021   BILITOT 0.6 03/15/2021   Lab Results  Component Value Date   HGBA1C 5.6 01/31/2021   Lab Results  Component Value Date   TSH 2.00 01/31/2021   Lab Results  Component Value Date   CHOL 219 (H) 01/31/2021   HDL 38 (L) 01/31/2021   LDLCALC 147 (H) 01/31/2021   TRIG 207 (H) 01/31/2021   CHOLHDL 5.8 (H) 01/31/2021   Lab Results  Component Value Date   VD25OH 51 05/16/2021   VD25OH 13 (L) 01/31/2021   Lab Results  Component Value Date   WBC 12.4 (H) 03/15/2021   HGB 13.1 03/15/2021   HCT 38.6 03/15/2021   MCV 86.0 03/15/2021   PLT 284 03/15/2021   Attestation Statements:   Reviewed by clinician on day of visit: allergies, medications, problem list, medical history, surgical history, family history, social history, and previous encounter notes.  I, Water quality scientist, CMA, am acting as transcriptionist for Briscoe Deutscher, DO  I have reviewed the above documentation for accuracy and completeness, and I agree with the above. Briscoe Deutscher, DO

## 2021-07-11 NOTE — Assessment & Plan Note (Signed)
Encouraged weight loss as this can help reduce friction rubbing and cyst formation

## 2021-07-11 NOTE — Progress Notes (Signed)
Subjective:    Patient ID: Katherine Foster, female    DOB: 09/03/1976, 45 y.o.   MRN: [id number]  HPI  Pt presents to the clinic today with c/o multiple bumps of her labia. She noticed this 3 months ago. The bump has not gotten bigger in size. She has not noticed any drainage from the area. The area is not tender or painful. She has had a Bartholin gland cyst in the past but thinks these bumps are different from that.  She has not tried anything OTC for this.  Review of Systems     Past Medical History:  Diagnosis Date   Chronic cough    Constipation    Hyperlipidemia    Hypertension    Joint pain    Lower extremity edema    Palpitations    SOB (shortness of breath) on exertion     Current Outpatient Medications  Medication Sig Dispense Refill   citalopram (CELEXA) 20 MG tablet TAKE 1 TABLET(20 MG) BY MOUTH DAILY. FOLLOW-UP APPOINTMENT AS NEEDED FOR ANXIETY 90 tablet 0   Diclofenac Sodium (PENNSAID) 2 % SOLN Place 1 application onto the skin 2 (two) times daily. 112 g 2   gabapentin (NEURONTIN) 300 MG capsule Take 1 capsule (300 mg total) by mouth 3 (three) times daily. (Patient taking differently: Take 300 mg by mouth 3 (three) times daily. Takes prn) 270 capsule 0   losartan (COZAAR) 100 MG tablet TAKE 1 TABLET(100 MG) BY MOUTH DAILY 90 tablet 0   rizatriptan (MAXALT) 5 MG tablet Take 5 mg by mouth as needed for migraine. May repeat in 2 hours if needed     Sodium Sulfate-Mag Sulfate-KCl (SUTAB) [id number] MG TABS Per instructions given 24 tablet 0   tirzepatide (MOUNJARO) 2.5 MG/0.5ML Pen Inject 2.5 mg into the skin once a week. 2 mL 0   No current facility-administered medications for this visit.    Allergies  Allergen Reactions   Sulfa Antibiotics     Family History  Problem Relation Age of Onset   Heart disease Mother    Hypertension Mother    Diabetes Mother    Diabetes Mellitus II Mother    Hypothyroidism Mother    Depression Mother    Anxiety disorder  Mother    Liver disease Mother    Obesity Mother    Obesity Father    Thyroid disease Father    Hyperlipidemia Father    Hypertension Father    Diabetes Father    Heart disease Father    Heart attack Father    Diabetes Mellitus II Father    Sudden death Father    Breast cancer Maternal Grandmother    Cancer Maternal Grandmother    Diabetes Maternal Grandfather    Heart attack Paternal Grandfather     Social History   Socioeconomic History   Marital status: Married    Spouse name: Not on file   Number of children: Not on file   Years of education: Not on file   Highest education level: Not on file  Occupational History   Occupation: Industrial/product designer  Tobacco Use   Smoking status: Never   Smokeless tobacco: Never  Vaping Use   Vaping Use: Never used  Substance and Sexual Activity   Alcohol use: Yes   Drug use: Never   Sexual activity: Not on file  Other Topics Concern   Not on file  Social History Narrative   Not on file   Social Determinants of  Health   Financial Resource Strain: Not on file  Food Insecurity: Not on file  Transportation Needs: Not on file  Physical Activity: Not on file  Stress: Not on file  Social Connections: Not on file  Intimate Partner Violence: Not on file     Constitutional: Denies fever, malaise, fatigue, headache or abrupt weight changes.  Respiratory: Denies difficulty breathing, shortness of breath, cough or sputum production.   Cardiovascular: Denies chest pain, chest tightness, palpitations or swelling in the hands or feet.  Gastrointestinal: Denies abdominal pain, bloating, constipation, diarrhea or blood in the stool.  GU: Denies urgency, frequency, pain with urination, burning sensation, blood in urine, odor or discharge. Skin: Patient reports bumps of labia.  Denies redness, rashes, or ulcercations.   No other specific complaints in a complete review of systems (except as listed in HPI  above).  Objective:   Physical Exam  BP 121/68 (BP Location: Right Arm, Patient Position: Sitting, Cuff Size: Large)   Pulse 71   Temp (!) 97.3 F (36.3 C) (Temporal)   Resp 17   Ht '5\' 4"'$  (1.626 m)   Wt 183 lb 12.8 oz (83.4 kg)   SpO2 100%   BMI 31.55 kg/m   Wt Readings from Last 3 Encounters:  07/06/21 179 lb (81.2 kg)  07/05/21 182 lb 6.4 oz (82.7 kg)  05/31/21 181 lb 6.4 oz (82.3 kg)    General: Appears her stated age, obese, in NAD. Skin: Warm, dry and intact.  Cardiovascular: Normal rate. Pulmonary/Chest: Normal effort and positive vesicular breath sounds. No respiratory distress. No wheezes, rales or ronchi noted.  Pelvic: Normal female anatomy.  0.75 cm mucoid cyst noted of right labia minora.  She has a sebaceous cyst noted to the left of the clitoris.  She has a sebaceous cyst noted to the right of the clitoris between the labia majora and labia minora.  She has a cluster of sebaceous cysts noted on the inferior right labia majora. Musculoskeletal: No difficulty with gait.  Neurological: Alert and oriented.   BMET    Component Value Date/Time   NA 137 03/15/2021 1018   K 4.0 03/15/2021 1018   CL 104 03/15/2021 1018   CO2 23 03/15/2021 1018   GLUCOSE 86 03/15/2021 1018   BUN 12 03/15/2021 1018   CREATININE 0.80 03/15/2021 1018   CREATININE 0.87 01/31/2021 1002   CALCIUM 8.9 03/15/2021 1018   GFRNONAA >60 03/15/2021 1018   GFRNONAA 84 12/29/2019 1031   GFRAA 97 12/29/2019 1031    Lipid Panel     Component Value Date/Time   CHOL 219 (H) 01/31/2021 1002   TRIG 207 (H) 01/31/2021 1002   HDL 38 (L) 01/31/2021 1002   CHOLHDL 5.8 (H) 01/31/2021 1002   LDLCALC 147 (H) 01/31/2021 1002    CBC    Component Value Date/Time   WBC 12.4 (H) 03/15/2021 1018   RBC 4.49 03/15/2021 1018   HGB 13.1 03/15/2021 1018   HCT 38.6 03/15/2021 1018   PLT 284 03/15/2021 1018   MCV 86.0 03/15/2021 1018   MCH 29.2 03/15/2021 1018   MCHC 33.9 03/15/2021 1018   RDW 12.4  03/15/2021 1018   LYMPHSABS 2,888 01/31/2021 1002   EOSABS 452 01/31/2021 1002   BASOSABS 84 01/31/2021 1002    Hgb A1C Lab Results  Component Value Date   HGBA1C 5.6 01/31/2021           Assessment & Plan:   Sebaceous Cyst of Labia, Vaginal Mucocele:  Advised her  these are typically benign and self-limiting They are not enlarging, painful or draining Discussed referral to GYN to discuss surgical excision of these lesions however she declines at this time and I agree that this is not necessary unless it starts to become painful, draining or interfere with sexual activity Will monitor for now  Return precautions discussed Webb Silversmith, NP This visit occurred during the SARS-CoV-2 public health emergency.  Safety protocols were in place, including screening questions prior to the visit, additional usage of staff PPE, and extensive cleaning of exam room while observing appropriate contact time as indicated for disinfecting solutions.

## 2021-07-11 NOTE — Patient Instructions (Signed)
Excision of Skin Lesions, Care After  This sheet gives you information about how to care for yourself after your procedure. Your health care provider may also give you more specific instructions. If you have problems or questions, contact your health care provider.  What can I expect after the procedure?  After your procedure, it is common to have pain or discomfort at the excision site.  Follow these instructions at home:  Excision care    Follow instructions from your health care provider about how to take care of your excision site. Make sure you:  Wash your hands with soap and water before and after you change your bandage (dressing). If soap and water are not available, use hand sanitizer.  Change your dressing as told by your health care provider.  Leave stitches (sutures), skin glue, or adhesive strips in place. These skin closures may need to stay in place for 2 weeks or longer. If adhesive strip edges start to loosen and curl up, you may trim the loose edges. Do not remove adhesive strips completely unless your health care provider tells you to do that.  Check the excision area every day for signs of infection. Watch for:  Redness, swelling, or pain.  Fluid or blood.  Warmth.  Pus or a bad smell.  Keep the site clean, dry, and protected for at least 48 hours.  For bleeding, apply gentle but firm pressure to the area using a folded towel for 20 minutes.  Avoid high-impact exercise and activities until the sutures are removed or the area heals.  General instructions  Take over-the-counter and prescription medicines only as told by your health care provider.  Follow instructions from your health care provider about how to minimize scarring. Scarring should lessen over time.  Avoid sun exposure until the area has healed. Use sunscreen to protect the area from the sun after it has healed.  Keep all follow-up visits as told by your health care provider. This is important.  Contact a health care provider if:  You  have redness, swelling, or pain around your excision site.  You have fluid or blood coming from your excision site.  Your excision site feels warm to the touch.  You have pus or a bad smell coming from your excision site.  You have a fever.  You have pain that does not improve in 2-3 days after your procedure.  You notice skin irregularities or changes in how you feel (sensation).  Summary  This sheet of instructions provides you with information about caring for yourself after your procedure. Contact your health care provider if you have any problems or questions.  Take over-the-counter and prescription medicines only as told by your health care provider.  Change your dressing as told by your health care provider.  Contact a health care provider if you have redness, swelling, pain, or other signs of infection around your excision site.  Keep all follow-up visits as told by your health care provider. This is important.  This information is not intended to replace advice given to you by your health care provider. Make sure you discuss any questions you have with your health care provider.  Document Revised: 04/23/2018 Document Reviewed: 04/23/2018  Elsevier Patient Education  2022 [employer]

## 2021-07-12 DIAGNOSIS — M25562 Pain in left knee: Secondary | ICD-10-CM | POA: Diagnosis not present

## 2021-07-12 DIAGNOSIS — M25561 Pain in right knee: Secondary | ICD-10-CM | POA: Diagnosis not present

## 2021-07-12 NOTE — Telephone Encounter (Signed)
I spoke to pt and she is aware that Dx is screening, but while doing procedure can change if anything is found and/or biopsied--removed

## 2021-07-18 ENCOUNTER — Other Ambulatory Visit: Payer: Self-pay

## 2021-07-18 ENCOUNTER — Ambulatory Visit
Admission: RE | Admit: 2021-07-18 | Discharge: 2021-07-18 | Disposition: A | Discharge status: Home / Self Care | Payer: BC Managed Care – PPO | Source: Ambulatory Visit | Attending: Gastroenterology | Admitting: Gastroenterology

## 2021-07-18 ENCOUNTER — Ambulatory Visit: Payer: BC Managed Care – PPO | Admitting: Registered Nurse

## 2021-07-18 ENCOUNTER — Encounter
Admission: RE | Disposition: A | Discharge status: Home / Self Care | Payer: Self-pay | Source: Ambulatory Visit | Attending: Gastroenterology

## 2021-07-18 ENCOUNTER — Encounter: Payer: Self-pay | Admitting: Gastroenterology

## 2021-07-18 DIAGNOSIS — D120 Benign neoplasm of cecum: Secondary | ICD-10-CM | POA: Insufficient documentation

## 2021-07-18 DIAGNOSIS — Z1211 Encounter for screening for malignant neoplasm of colon: Secondary | ICD-10-CM

## 2021-07-18 DIAGNOSIS — Z8249 Family history of ischemic heart disease and other diseases of the circulatory system: Secondary | ICD-10-CM | POA: Diagnosis not present

## 2021-07-18 DIAGNOSIS — K635 Polyp of colon: Secondary | ICD-10-CM | POA: Diagnosis not present

## 2021-07-18 DIAGNOSIS — D123 Benign neoplasm of transverse colon: Secondary | ICD-10-CM | POA: Diagnosis not present

## 2021-07-18 DIAGNOSIS — K648 Other hemorrhoids: Secondary | ICD-10-CM | POA: Diagnosis not present

## 2021-07-18 DIAGNOSIS — I10 Essential (primary) hypertension: Secondary | ICD-10-CM | POA: Insufficient documentation

## 2021-07-18 DIAGNOSIS — D216 Benign neoplasm of connective and other soft tissue of trunk, unspecified: Secondary | ICD-10-CM | POA: Diagnosis not present

## 2021-07-18 DIAGNOSIS — K625 Hemorrhage of anus and rectum: Secondary | ICD-10-CM

## 2021-07-18 DIAGNOSIS — Z79899 Other long term (current) drug therapy: Secondary | ICD-10-CM | POA: Insufficient documentation

## 2021-07-18 DIAGNOSIS — E785 Hyperlipidemia, unspecified: Secondary | ICD-10-CM | POA: Insufficient documentation

## 2021-07-18 DIAGNOSIS — D122 Benign neoplasm of ascending colon: Secondary | ICD-10-CM | POA: Diagnosis not present

## 2021-07-18 DIAGNOSIS — Z882 Allergy status to sulfonamides status: Secondary | ICD-10-CM | POA: Diagnosis not present

## 2021-07-18 HISTORY — PX: COLONOSCOPY WITH PROPOFOL: SHX5780

## 2021-07-18 LAB — POCT PREGNANCY, URINE: Preg Test, Ur: NEGATIVE

## 2021-07-18 SURGERY — COLONOSCOPY WITH PROPOFOL
Anesthesia: General

## 2021-07-18 MED ORDER — PROPOFOL 10 MG/ML IV BOLUS
INTRAVENOUS | Status: DC | PRN
  Administered 2021-07-18: 80 mg via INTRAVENOUS

## 2021-07-18 MED ORDER — LIDOCAINE HCL (CARDIAC) PF 100 MG/5ML IV SOSY
PREFILLED_SYRINGE | INTRAVENOUS | Status: DC | PRN
  Administered 2021-07-18: 100 mg via INTRAVENOUS

## 2021-07-18 MED ORDER — PROPOFOL 500 MG/50ML IV EMUL
INTRAVENOUS | Status: DC | PRN
  Administered 2021-07-18: 150 ug/kg/min via INTRAVENOUS

## 2021-07-18 MED ORDER — PROPOFOL 500 MG/50ML IV EMUL
INTRAVENOUS | Status: AC
  Filled 2021-07-18: qty 50

## 2021-07-18 MED ORDER — SODIUM CHLORIDE (PF) 0.9 % IJ SOLN
INTRAMUSCULAR | Status: DC | PRN
  Administered 2021-07-18: 9 mL via INTRAVENOUS

## 2021-07-18 MED ORDER — SODIUM CHLORIDE 0.9 % IV SOLN: INTRAVENOUS | Status: DC

## 2021-07-18 NOTE — Anesthesia Preprocedure Evaluation (Signed)
Anesthesia Evaluation  Patient identified by MRN, date of birth, ID band Patient awake    Reviewed: Allergy & Precautions, H&P , NPO status , Patient's Chart, lab work & pertinent test results, reviewed documented beta blocker date and time   History of Anesthesia Complications Negative for: history of anesthetic complications  Airway Mallampati: II  TM Distance: >3 FB Neck ROM: full    Dental  (+) Dental Advidsory Given, Teeth Intact   Pulmonary neg pulmonary ROS,    Pulmonary exam normal breath sounds clear to auscultation       Cardiovascular Exercise Tolerance: Good hypertension, (-) angina(-) Past MI and (-) Cardiac Stents Normal cardiovascular exam+ dysrhythmias (palpitations) (-) Valvular Problems/Murmurs Rhythm:regular Rate:Normal     Neuro/Psych  Headaches, neg Seizures negative psych ROS   GI/Hepatic negative GI ROS, Neg liver ROS,   Endo/Other  negative endocrine ROS  Renal/GU negative Renal ROS  negative genitourinary   Musculoskeletal   Abdominal   Peds  Hematology negative hematology ROS (+)   Anesthesia Other Findings Past Medical History: No date: Chronic cough No date: Constipation No date: Hyperlipidemia No date: Hypertension No date: Joint pain No date: Lower extremity edema No date: Palpitations No date: SOB (shortness of breath) on exertion   Reproductive/Obstetrics negative OB ROS                             Anesthesia Physical Anesthesia Plan  ASA: 2  Anesthesia Plan: General   Post-op Pain Management:    Induction: Intravenous  PONV Risk Score and Plan: 3 and TIVA and Propofol infusion  Airway Management Planned: Natural Airway and Nasal Cannula  Additional Equipment:   Intra-op Plan:   Post-operative Plan:   Informed Consent: I have reviewed the patients History and Physical, chart, labs and discussed the procedure including the risks,  benefits and alternatives for the proposed anesthesia with the patient or authorized representative who has indicated his/her understanding and acceptance.     Dental Advisory Given  Plan Discussed with: Anesthesiologist, CRNA and Surgeon  Anesthesia Plan Comments:         Anesthesia Quick Evaluation

## 2021-07-18 NOTE — H&P (Signed)
Katherine Antigua, MD [address] [place], [place], [place], [address] [id number], [place], [place], [place], [address] Phone: [phone]  Fax: [phone]  Primary Care Physician:  Jearld Fenton, NP   Pre-Procedure History & Physical: HPI:  Katherine Foster is a 45 y.o. female is here for a colonoscopy.   Past Medical History:  Diagnosis Date   Chronic cough    Constipation    Hyperlipidemia    Hypertension    Joint pain    Lower extremity edema    Palpitations    SOB (shortness of breath) on exertion     Past Surgical History:  Procedure Laterality Date   BREAST BIOPSY Right 04/05/2020   [place] bx, coil marker, BENIGN MAMMARY PARENCHYMA   LASIK  11/06/2019   WISDOM TOOTH EXTRACTION      Prior to Admission medications   Medication Sig Start Date End Date Taking? Authorizing Provider  citalopram (CELEXA) 20 MG tablet TAKE 1 TABLET(20 MG) BY MOUTH DAILY. FOLLOW-UP APPOINTMENT AS NEEDED FOR ANXIETY Patient taking differently: Take 20 mg by mouth daily. 05/02/21  Yes Jearld Fenton, NP  gabapentin (NEURONTIN) 300 MG capsule Take 1 capsule (300 mg total) by mouth 3 (three) times daily. Patient taking differently: Take 300 mg by mouth 3 (three) times daily. Takes prn 02/24/20  Yes Malfi, Lupita Raider, FNP  losartan (COZAAR) 100 MG tablet TAKE 1 TABLET(100 MG) BY MOUTH DAILY 05/02/21  Yes Baity, Coralie Keens, NP  rizatriptan (MAXALT) 5 MG tablet Take 5 mg by mouth as needed for migraine. May repeat in 2 hours if needed   Yes [provider]    Allergies as of 05/15/2021 - Review Complete 05/15/2021  Allergen Reaction Noted   Sulfa antibiotics  07/14/2015    Family History  Problem Relation Age of Onset   Heart disease Mother    Hypertension Mother    Diabetes Mother    Diabetes Mellitus II Mother    Hypothyroidism Mother    Depression Mother    Anxiety disorder Mother    Liver disease Mother    Obesity Mother    Obesity Father    Thyroid disease Father     Hyperlipidemia Father    Hypertension Father    Diabetes Father    Heart disease Father    Heart attack Father    Diabetes Mellitus II Father    Sudden death Father    Breast cancer Maternal Grandmother    Cancer Maternal Grandmother    Diabetes Maternal Grandfather    Heart attack Paternal Grandfather     Social History   Socioeconomic History   Marital status: Married    Spouse name: Not on file   Number of children: Not on file   Years of education: Not on file   Highest education level: Not on file  Occupational History   Occupation: Industrial/product designer  Tobacco Use   Smoking status: Never   Smokeless tobacco: Never  Vaping Use   Vaping Use: Never used  Substance and Sexual Activity   Alcohol use: Yes    Comment: occ   Drug use: Never   Sexual activity: Not on file  Other Topics Concern   Not on file  Social History Narrative   Not on file   Social Determinants of Health   Financial Resource Strain: Not on file  Food Insecurity: Not on file  Transportation Needs: Not on file  Physical Activity: Not on file  Stress: Not on  file  Social Connections: Not on file  Intimate Partner Violence: Not on file    Review of Systems: See HPI, otherwise negative ROS  Physical Exam: Constitutional: General:   Alert,  Well-developed, well-nourished, pleasant and cooperative in NAD BP (!) 133/97   Pulse (!) 102   Temp 98 F (36.7 C) (Temporal)   Resp 18   Ht 5\' 4"  (1.626 m)   Wt 82.1 kg   SpO2 96%   BMI 31.07 kg/m   Head: Normocephalic, atraumatic.   Eyes:  Sclera clear, no icterus.   Conjunctiva pink.   Mouth:  No deformity or lesions, oropharynx pink & moist.  Neck:  Supple, trachea midline  Respiratory: Normal respiratory effort  Gastrointestinal:  Soft, non-tender and non-distended without masses, hepatosplenomegaly or hernias noted.  No guarding or rebound tenderness.     Cardiac: No clubbing or edema.  No cyanosis.  Normal posterior tibial pedal pulses noted.  Lymphatic:  No significant cervical adenopathy.  Psych:  Alert and cooperative. Normal mood and affect.  Musculoskeletal:   Symmetrical without gross deformities. 5/5 Lower extremity strength bilaterally.  Skin: Warm. Intact without significant lesions or rashes. No jaundice.  Neurologic:  Face symmetrical, tongue midline, Normal sensation to touch;  grossly normal neurologically.  Psych:  Alert and oriented x3, Alert and cooperative. Normal mood and affect.  Impression/Plan: Katherine Foster is here for a colonoscopy to be performed for average risk screening.  Risks, benefits, limitations, and alternatives regarding  colonoscopy have been reviewed with the patient.  Questions have been answered.  All parties agreeable.   Virgel Manifold, MD  07/18/2021, 9:44 AM

## 2021-07-18 NOTE — Transfer of Care (Signed)
Immediate Anesthesia Transfer of Care Note  Patient: Clydette Privitera  Procedure(s) Performed: COLONOSCOPY WITH PROPOFOL  Patient Location: Endoscopy Unit  Anesthesia Type:General  Level of Consciousness: drowsy  Airway & Oxygen Therapy: Patient Spontanous Breathing  Post-op Assessment: Report given to RN and Post -op Vital signs reviewed and stable  Post vital signs: Reviewed and stable  Last Vitals:  Vitals Value Taken Time  BP 121/75 07/18/21 1051  Temp    Pulse 74 07/18/21 1052  Resp 21 07/18/21 1052  SpO2 97 % 07/18/21 1052  Vitals shown include unvalidated device data.  Last Pain:  Vitals:   07/18/21 0840  TempSrc: Temporal  PainSc: 0-No pain         Complications: No notable events documented.

## 2021-07-18 NOTE — Op Note (Signed)
[facility] Gastroenterology Patient Name: Katherine Foster Procedure Date: 07/18/2021 9:40 AM MRN: [id number] Account #: [id number] Date of Birth: May 11, 1976 Admit Type: Outpatient Age: 45 Room: [facility] ENDO ROOM 3 Gender: Female Note Status: Finalized Instrument Name: Jasper Riling [id number] Procedure:             Colonoscopy Indications:           Screening for colorectal malignant neoplasm Providers:             Tavien Chestnut B. Bonna Gains MD, MD Referring MD:          Jearld Fenton (Referring MD) Medicines:             Monitored Anesthesia Care Complications:         No immediate complications. Procedure:             Pre-Anesthesia Assessment:                        - ASA Grade Assessment: II - A patient with mild                         systemic disease.                        - Prior to the procedure, a History and Physical was                         performed, and patient medications, allergies and                         sensitivities were reviewed. The patient's tolerance                         of previous anesthesia was reviewed.                        - The risks and benefits of the procedure and the                         sedation options and risks were discussed with the                         patient. All questions were answered and informed                         consent was obtained.                        - Patient identification and proposed procedure were                         verified prior to the procedure by the physician, the                         nurse, the anesthesiologist, the anesthetist and the                         technician. The procedure was verified in the  procedure room.                        After obtaining informed consent, the colonoscope was                         passed under direct vision. Throughout the procedure,                         the patient's blood pressure, pulse, and oxygen                          saturations were monitored continuously. The                         Colonoscope was introduced through the anus and                         advanced to the the cecum, identified by appendiceal                         orifice and ileocecal valve. The colonoscopy was                         performed with ease. The patient tolerated the                         procedure well. The quality of the bowel preparation                         was good. Findings:      The perianal and digital rectal examinations were normal.      A 10 mm polyp was found in the cecum. The polyp was sessile. The polyp       was removed with a hot snare. Resection and retrieval were complete. To       prevent bleeding after the polypectomy, two hemostatic clips were       successfully placed. There was no bleeding at the end of the procedure.      A 15 mm polyp was found in the ascending colon. The polyp was flat. The       polyp was removed with a piecemeal technique using a hot snare.       Resection and retrieval were complete. To prevent bleeding after the       polypectomy, two hemostatic clips were successfully placed. There was no       bleeding at the end of the procedure. The location of this polyp was       across from the Ileocecal valve as seen in the images.      Two sessile polyps were found in the ascending colon. The polyps were 5       to 6 mm in size. These polyps were removed with a cold snare. Resection       and retrieval were complete. For hemostasis, two hemostatic clips were       successfully placed. There was no bleeding at the end of the procedure.      A 15 mm polyp was found in the transverse colon. The polyp was sessile.       On initial examination the polyp appeared small.  However, on further       view it had a broad base, and thus required lift to improve       visualization and removal via EMR. Preparations were made for mucosal       resection. NBI Scan was done to mark  the borders of the lesion. Saline       was injected to raise the lesion. Piecemeal mucosal resection using a       snare was performed. Resection and retrieval were complete. To prevent       bleeding after the polypectomy, two hemostatic clips were successfully       placed. There was no bleeding at the end of the procedure.      Two sessile polyps were found in the transverse colon. The polyps were 5       to 6 mm in size. These polyps were removed with a cold snare. Resection       and retrieval were complete.      The exam was otherwise without abnormality.      Non-bleeding internal hemorrhoids were found during retroflexion.      Anal papilla(e) were hypertrophied. Impression:            - One 10 mm polyp in the cecum, removed with a hot                         snare. Resected and retrieved. Clips were placed.                        - One 15 mm polyp in the ascending colon, removed                         piecemeal using a hot snare. Resected and retrieved.                         Clips were placed.                        - Two 5 to 6 mm polyps in the ascending colon, removed                         with a cold snare. Resected and retrieved. Clips were                         placed.                        - One 15 mm polyp in the transverse colon, removed                         with mucosal resection. Resected and retrieved. Clips                         were placed.                        - Two 5 to 6 mm polyps in the transverse colon,                         removed with a cold snare. Resected and retrieved.                        -  The examination was otherwise normal.                        - Non-bleeding internal hemorrhoids.                        - Anal papilla(e) were hypertrophied.                        - Mucosal resection was performed. Resection and                         retrieval were complete. Recommendation:        - Discharge patient to home (with escort).                         - Advance diet as tolerated.                        - Continue present medications.                        - Await pathology results.                        - Repeat colonoscopy date to be determined after                         pending pathology results are reviewed.                        - The findings and recommendations were discussed with                         the patient.                        - The findings and recommendations were discussed with                         the patient's family.                        - Return to primary care physician as previously                         scheduled.                        - High fiber diet. Procedure Code(s):     --- Professional ---                        [phone], Colonoscopy, flexible; with endoscopic mucosal                         resection                        [id number], 26, Colonoscopy, flexible; with removal of                         tumor(s), polyp(s), or other lesion(s) by snare  technique Diagnosis Code(s):     --- Professional ---                        K63.5, Polyp of colon                        Z12.11, Encounter for screening for malignant neoplasm                         of colon CPT copyright 2019 American Medical Association. All rights reserved. The codes documented in this report are preliminary and upon coder review may  be revised to meet current compliance requirements.  Vonda Antigua, MD Margretta Sidle B. Bonna Gains MD, MD 07/18/2021 11:01:06 AM This report has been signed electronically. Number of Addenda: 0 Note Initiated On: 07/18/2021 9:40 AM Scope Withdrawal Time: 0 hours 53 minutes 31 seconds  Total Procedure Duration: 0 hours 56 minutes 26 seconds  Estimated Blood Loss:  Estimated blood loss: none.      [facility]

## 2021-07-18 NOTE — Anesthesia Postprocedure Evaluation (Signed)
Anesthesia Post Note  Patient: Katherine Foster  Procedure(s) Performed: COLONOSCOPY WITH PROPOFOL  Patient location during evaluation: Endoscopy Anesthesia Type: General Level of consciousness: awake and alert Pain management: pain level controlled Vital Signs Assessment: post-procedure vital signs reviewed and stable Respiratory status: spontaneous breathing, nonlabored ventilation, respiratory function stable and patient connected to nasal cannula oxygen Cardiovascular status: blood pressure returned to baseline and stable Postop Assessment: no apparent nausea or vomiting Anesthetic complications: no   No notable events documented.   Last Vitals:  Vitals:   07/18/21 1110 07/18/21 1120  BP: 132/82 (!) 128/95  Pulse: 61 60  Resp: 15 14  Temp:    SpO2: 100% 100%    Last Pain:  Vitals:   07/18/21 1050  TempSrc: Temporal  PainSc:                  Martha Clan

## 2021-07-19 ENCOUNTER — Encounter: Payer: Self-pay | Admitting: Gastroenterology

## 2021-07-19 DIAGNOSIS — M25561 Pain in right knee: Secondary | ICD-10-CM | POA: Diagnosis not present

## 2021-07-19 DIAGNOSIS — M25562 Pain in left knee: Secondary | ICD-10-CM | POA: Diagnosis not present

## 2021-07-19 LAB — SURGICAL PATHOLOGY

## 2021-07-20 ENCOUNTER — Encounter: Payer: Self-pay | Admitting: Gastroenterology

## 2021-07-24 ENCOUNTER — Encounter (INDEPENDENT_AMBULATORY_CARE_PROVIDER_SITE_OTHER): Payer: Self-pay

## 2021-07-27 ENCOUNTER — Ambulatory Visit (INDEPENDENT_AMBULATORY_CARE_PROVIDER_SITE_OTHER): Payer: BC Managed Care – PPO | Admitting: Family Medicine

## 2021-07-30 ENCOUNTER — Other Ambulatory Visit: Payer: Self-pay | Admitting: Internal Medicine

## 2021-07-30 DIAGNOSIS — R454 Irritability and anger: Secondary | ICD-10-CM

## 2021-07-30 DIAGNOSIS — I10 Essential (primary) hypertension: Secondary | ICD-10-CM

## 2021-07-30 NOTE — Telephone Encounter (Signed)
filled 07/30/21 Webb Silversmith NP

## 2021-07-30 NOTE — Telephone Encounter (Signed)
Called pt and LM on VM to call office to schedule F/U

## 2021-08-02 ENCOUNTER — Encounter (INDEPENDENT_AMBULATORY_CARE_PROVIDER_SITE_OTHER): Payer: Self-pay

## 2021-08-03 ENCOUNTER — Ambulatory Visit (INDEPENDENT_AMBULATORY_CARE_PROVIDER_SITE_OTHER): Payer: BC Managed Care – PPO | Admitting: Adult Health

## 2021-08-03 ENCOUNTER — Other Ambulatory Visit: Payer: Self-pay

## 2021-08-03 ENCOUNTER — Encounter (INDEPENDENT_AMBULATORY_CARE_PROVIDER_SITE_OTHER): Payer: Self-pay | Admitting: Adult Health

## 2021-08-03 VITALS — BP 125/73 | HR 72 | Temp 98.4°F | Ht 64.0 in | Wt 176.0 lb

## 2021-08-03 DIAGNOSIS — Z9189 Other specified personal risk factors, not elsewhere classified: Secondary | ICD-10-CM | POA: Diagnosis not present

## 2021-08-03 DIAGNOSIS — R110 Nausea: Secondary | ICD-10-CM | POA: Diagnosis not present

## 2021-08-03 DIAGNOSIS — E669 Obesity, unspecified: Secondary | ICD-10-CM | POA: Diagnosis not present

## 2021-08-03 DIAGNOSIS — E782 Mixed hyperlipidemia: Secondary | ICD-10-CM | POA: Diagnosis not present

## 2021-08-03 DIAGNOSIS — I10 Essential (primary) hypertension: Secondary | ICD-10-CM

## 2021-08-03 DIAGNOSIS — Z6831 Body mass index (BMI) 31.0-31.9, adult: Secondary | ICD-10-CM

## 2021-08-03 NOTE — Progress Notes (Signed)
Chief Complaint:   OBESITY Katherine Foster is here to discuss her progress with her obesity treatment plan along with follow-up of her obesity related diagnoses. Katherine Foster is on keeping a food journal and adhering to recommended goals of 1200 calories and 95 grams of protein and states she is following her eating plan approximately 60% of the time. Katherine Foster states she is not exercising regularly.  Today's visit was #: 5 Starting weight: 184 lbs Starting date: 04/18/2021 Today's weight: 176 lbs Today's date: 08/03/2021 Total lbs lost to date: 8 lbs Total lbs lost since last in-office visit: 3 lbs  Interim History: Katherine Foster was started on Mounjaro 2.5 mg - 07/06/2021 - only had 1 dose thus far. On 07/18/2021 - C-scope - multiple polyps excised.  Repeat study in 1 year.   Family history - maternal grandmother - 41 - passed from colon cancer.  Subjective:   1. Essential hypertension BP/HR at goal at office visit. She is on losartan- managed by her PCP.  2. Mixed hyperlipidemia Katherine Foster was started on Mounjaro 2.5 mg - 07/06/2021 - only had 1 dose thus far. She denies family hx of MTC or personal hx of pancreatitis.  3. Nausea without vomiting She reports nausea without vomiting for 72 hrs after first Mounjaro injection.  4. At risk for constipation Katherine Foster is at increased risk for constipation due to GIP/GLP-1 for metabolic condition.   Assessment/Plan:   1. Essential hypertension Continue with Losartan 100mg - schedule f/u appt with PCP.  2. Mixed hyperlipidemia Continue on Mounjaro 2.5mg   3. Nausea without vomiting Do not overeat with Mounjaro. Remain on Mounjaro 2.5mg - do not increase.  4. At risk for constipation Katherine Foster was given approximately 15 minutes of counseling today regarding prevention of constipation. She was encouraged to increase water and fiber intake.    5. Obesity, current BMI 30.3  Katherine Foster is currently in the action stage of change. As such, her goal is to continue with  weight loss efforts. She has agreed to keeping a food journal and adhering to recommended goals of 1200 calories and 95 grams of protein.   Exercise goals: All adults should avoid inactivity. Some physical activity is better than none, and adults who participate in any amount of physical activity gain some health benefits.  Plans to join [employer] in [place].  Katherine Foster modification strategies: increasing lean protein intake, decreasing simple carbohydrates, keeping healthy foods in the home, ways to avoid boredom eating, planning for success, and keeping a strict food journal.  Katherine Foster has agreed to follow-up with our clinic in 4 weeks, fasting. She was informed of the importance of frequent follow-up visits to maximize her success with intensive lifestyle modifications for her multiple health conditions.   Objective:   Blood pressure 125/73, pulse 72, temperature 98.4 F (36.9 C), height 5\' 4"  (1.626 m), weight 176 lb (79.8 kg), SpO2 99 %. Body mass index is 30.21 kg/m.  General: Cooperative, alert, well developed, in no acute distress. HEENT: Conjunctivae and lids unremarkable. Cardiovascular: Regular rhythm.  Lungs: Normal work of breathing. Neurologic: No focal deficits.   Lab Results  Component Value Date   CREATININE 0.80 03/15/2021   BUN 12 03/15/2021   NA 137 03/15/2021   K 4.0 03/15/2021   CL 104 03/15/2021   CO2 23 03/15/2021   Lab Results  Component Value Date   ALT 21 03/15/2021   AST 15 03/15/2021   ALKPHOS 65 03/15/2021   BILITOT 0.6 03/15/2021   Lab Results  Component Value  Date   HGBA1C 5.6 01/31/2021   Lab Results  Component Value Date   TSH 2.00 01/31/2021   Lab Results  Component Value Date   CHOL 219 (H) 01/31/2021   HDL 38 (L) 01/31/2021   LDLCALC 147 (H) 01/31/2021   TRIG 207 (H) 01/31/2021   CHOLHDL 5.8 (H) 01/31/2021   Lab Results  Component Value Date   VD25OH 51 05/16/2021   VD25OH 13 (L) 01/31/2021   Lab Results  Component  Value Date   WBC 12.4 (H) 03/15/2021   HGB 13.1 03/15/2021   HCT 38.6 03/15/2021   MCV 86.0 03/15/2021   PLT 284 03/15/2021   Attestation Statements:   Reviewed by clinician on day of visit: allergies, medications, problem list, medical history, surgical history, family history, social history, and previous encounter notes.  I, Water quality scientist, CMA, am acting as Location manager for Katherine Marble, NP.  I have reviewed the above documentation for accuracy and completeness, and I agree with the above. -  Katherine Foster d. Katherine Zambito, NP-C

## 2021-08-08 ENCOUNTER — Ambulatory Visit: Payer: BC Managed Care – PPO | Admitting: Family Medicine

## 2021-08-08 ENCOUNTER — Encounter: Payer: Self-pay | Admitting: Internal Medicine

## 2021-08-09 ENCOUNTER — Other Ambulatory Visit: Payer: Self-pay

## 2021-08-09 ENCOUNTER — Ambulatory Visit: Payer: BC Managed Care – PPO | Admitting: Family Medicine

## 2021-08-09 VITALS — BP 156/90 | HR 81 | Ht 64.0 in | Wt 179.8 lb

## 2021-08-09 DIAGNOSIS — M25561 Pain in right knee: Secondary | ICD-10-CM | POA: Diagnosis not present

## 2021-08-09 DIAGNOSIS — M25562 Pain in left knee: Secondary | ICD-10-CM | POA: Diagnosis not present

## 2021-08-09 DIAGNOSIS — G8929 Other chronic pain: Secondary | ICD-10-CM | POA: Diagnosis not present

## 2021-08-09 NOTE — Patient Instructions (Addendum)
Thank you for coming in today.   Keep up the good work.   If you need a PT tune up for this let me know. I can always plan another other.   Work hard on [employer] and exercises.   Recheck with me as needed.

## 2021-08-09 NOTE — Progress Notes (Signed)
   I, Peterson Lombard, LAT, ATC acting as a scribe for Lynne Leader, MD.  Katherine Foster is a 45 y.o. female who presents to [place] [facility] today for f/u of chronic B knee and foot pain.  She was last seen by Dr. Georgina Snell on 07/05/21 to cont PT at [facility] (completing 5 visits) and was advised to wear good comfortable footwear and cont stabilizing HEP and PT. Today, pt reports feeling motivated to do HEP and starting going to the gym/working out. Pt feels like she is making progress, but she will still avoid activities that cause pain. Pt has dc from PT.  Dx imaging: 05/31/21 R & L knee XR  Pertinent review of systems: No fevers or chills  Relevant historical information: Hypertension   Exam:  BP (!) 156/90   Pulse 81   Ht 5\' 4"  (1.626 m)   Wt 179 lb 12.8 oz (81.6 kg)   SpO2 97%   BMI 30.86 kg/m  General: Well Developed, well nourished, and in no acute distress.   MSK: Bilateral knees normal-appearing normal motion with crepitation.  Intact strength.    Lab and Radiology Results EXAM: RIGHT KNEE 3 VIEWS   COMPARISON:  None.   FINDINGS: No evidence of fracture, dislocation, or joint effusion. Normal joint spaces and alignment. No evidence of arthropathy or other focal bone abnormality. Soft tissues are unremarkable.   IMPRESSION: Unremarkable radiographs of the right knee.     Electronically Signed   By: Keith Rake M.D.   On: 06/01/2021 16:34    EXAM: LEFT KNEE 3 VIEWS   COMPARISON:  None.   FINDINGS: No evidence of fracture, dislocation, or joint effusion. Normal alignment and joint spaces. No evidence of arthropathy or other focal bone abnormality. Soft tissues are unremarkable.   IMPRESSION: Unremarkable radiographs of the left knee.     Electronically Signed   By: Keith Rake M.D.   On: 06/01/2021 16:29    I, Lynne Leader, personally (independently) visualized and performed the interpretation of the images attached in  this note.    Assessment and Plan: 45 y.o. female with bilateral knee pain that anterior knee thought to be due to patellofemoral chondromalacia and patellofemoral pain syndrome.  Patient has had significant improvement with quad strengthening with physical therapy.  Plan to continue her size program.  Spent time talking about exercises that she can do on her own at the gym.  Also talked about health and fitness and diet. Total encounter time 20 minutes including face-to-face time with the patient and, reviewing past medical record, and charting on the date of service.     Recheck as needed.    Discussed warning signs or symptoms. Please see discharge instructions. Patient expresses understanding.   The above documentation has been reviewed and is accurate and complete Lynne Leader, M.D.

## 2021-08-15 ENCOUNTER — Ambulatory Visit: Payer: BC Managed Care – PPO | Admitting: Gastroenterology

## 2021-08-22 ENCOUNTER — Encounter: Payer: Self-pay | Admitting: Internal Medicine

## 2021-08-22 ENCOUNTER — Ambulatory Visit: Payer: BC Managed Care – PPO | Admitting: Internal Medicine

## 2021-08-22 ENCOUNTER — Other Ambulatory Visit: Payer: Self-pay

## 2021-08-22 VITALS — BP 138/75 | HR 70 | Temp 97.3°F | Resp 17 | Ht 64.0 in | Wt 176.0 lb

## 2021-08-22 DIAGNOSIS — F419 Anxiety disorder, unspecified: Secondary | ICD-10-CM | POA: Insufficient documentation

## 2021-08-22 DIAGNOSIS — Z23 Encounter for immunization: Secondary | ICD-10-CM

## 2021-08-22 DIAGNOSIS — G43839 Menstrual migraine, intractable, without status migrainosus: Secondary | ICD-10-CM

## 2021-08-22 DIAGNOSIS — I10 Essential (primary) hypertension: Secondary | ICD-10-CM | POA: Diagnosis not present

## 2021-08-22 DIAGNOSIS — F411 Generalized anxiety disorder: Secondary | ICD-10-CM

## 2021-08-22 DIAGNOSIS — M170 Bilateral primary osteoarthritis of knee: Secondary | ICD-10-CM

## 2021-08-22 DIAGNOSIS — E782 Mixed hyperlipidemia: Secondary | ICD-10-CM | POA: Diagnosis not present

## 2021-08-22 DIAGNOSIS — Z6830 Body mass index (BMI) 30.0-30.9, adult: Secondary | ICD-10-CM

## 2021-08-22 DIAGNOSIS — E6609 Other obesity due to excess calories: Secondary | ICD-10-CM

## 2021-08-22 NOTE — Assessment & Plan Note (Signed)
Encouraged diet and exercise for weight loss ?

## 2021-08-22 NOTE — Assessment & Plan Note (Signed)
Citalopram refilled today Support offered

## 2021-08-22 NOTE — Assessment & Plan Note (Signed)
She is not taking any medications OTC for this Encouraged weight loss as this can help reduce joint pain

## 2021-08-22 NOTE — Assessment & Plan Note (Signed)
Lipid profile today  Encouraged her to consume a low fat diet

## 2021-08-22 NOTE — Progress Notes (Signed)
Subjective:    Patient ID: Katherine Foster, female    DOB: 1976/01/19, 45 y.o.   MRN: [id number]  HPI  Pt presents to the clinic today for follow up of chronic conditions.  Chronic Cough: Intermittent. She takes Gabapentin as needed with good relief of symptoms. She does not smoke.  HLD: Her last LDL was 147, triglycerides 207, 01/2021. She is not taking any cholesterol lowering medication at this time. She tries to consume a low fat diet.  HTN: Her BP today is 138/75. She is taking Losartan as prescribed. ECG from 03/2021 reviewed.  Migraines: These occur once every few months. Triggered by her menstrual cycle. She takes Rizatriptan as needed with good relief of symtpoms.  OA: Mainly in her knees. She does not take anything OTC for this.  Anxiety: Chronic issue, triggered by marital issues, managed on Citalopram. She is not currently seeing a therapist.  She denies depression, SI/HI.  Review of Systems     Past Medical History:  Diagnosis Date   Chronic cough    Constipation    Hyperlipidemia    Hypertension    Joint pain    Lower extremity edema    Palpitations    SOB (shortness of breath) on exertion     Current Outpatient Medications  Medication Sig Dispense Refill   citalopram (CELEXA) 20 MG tablet TAKE 1 TABLET(20 MG) BY MOUTH DAILY. FOLLOW-UP APPOINTMENT AS NEEDED FOR ANXIETY 30 tablet 0   gabapentin (NEURONTIN) 300 MG capsule Take 1 capsule (300 mg total) by mouth 3 (three) times daily. (Patient taking differently: Take 300 mg by mouth 3 (three) times daily. Takes prn) 270 capsule 0   losartan (COZAAR) 100 MG tablet TAKE 1 TABLET(100 MG) BY MOUTH DAILY 30 tablet 0   MOUNJARO 2.5 MG/0.5ML Pen SMARTSIG:2.5 Milligram(s) SUB-Q Once a Week     rizatriptan (MAXALT) 5 MG tablet Take 5 mg by mouth as needed for migraine. May repeat in 2 hours if needed     No current facility-administered medications for this visit.    Allergies  Allergen Reactions   Sulfa Antibiotics      Family History  Problem Relation Age of Onset   Heart disease Mother    Hypertension Mother    Diabetes Mother    Diabetes Mellitus II Mother    Hypothyroidism Mother    Depression Mother    Anxiety disorder Mother    Liver disease Mother    Obesity Mother    Obesity Father    Thyroid disease Father    Hyperlipidemia Father    Hypertension Father    Diabetes Father    Heart disease Father    Heart attack Father    Diabetes Mellitus II Father    Sudden death Father    Breast cancer Maternal Grandmother    Cancer Maternal Grandmother    Diabetes Maternal Grandfather    Heart attack Paternal Grandfather     Social History   Socioeconomic History   Marital status: Married    Spouse name: Not on file   Number of children: Not on file   Years of education: Not on file   Highest education level: Not on file  Occupational History   Occupation: Industrial/product designer  Tobacco Use   Smoking status: Never   Smokeless tobacco: Never  Vaping Use   Vaping Use: Never used  Substance and Sexual Activity   Alcohol use: Yes    Comment: occ   Drug use: Never  Sexual activity: Not on file  Other Topics Concern   Not on file  Social History Narrative   Not on file   Social Determinants of Health   Financial Resource Strain: Not on file  Food Insecurity: Not on file  Transportation Needs: Not on file  Physical Activity: Not on file  Stress: Not on file  Social Connections: Not on file  Intimate Partner Violence: Not on file     Constitutional: Pt reports intermittent headaches. Denies fever, malaise, fatigue, or abrupt weight changes.  HEENT: Denies eye pain, eye redness, ear pain, ringing in the ears, wax buildup, runny nose, nasal congestion, bloody nose, or sore throat. Respiratory: Pt reports intermittent cough. Denies difficulty breathing, shortness of breath,  or sputum production.   Cardiovascular: Denies chest pain, chest  tightness, palpitations or swelling in the hands or feet.  Gastrointestinal: Denies abdominal pain, bloating, constipation, diarrhea or blood in the stool.  GU: Denies urgency, frequency, pain with urination, burning sensation, blood in urine, odor or discharge. Musculoskeletal: Pt reports intermittent joint pain. Denies decrease in range of motion, difficulty with gait, muscle pain or joint swelling.  Skin: Denies redness, rashes, lesions or ulcercations.  Neurological: Denies dizziness, difficulty with memory, difficulty with speech or problems with balance and coordination.  Psych: Pt has a history of anxiety. Denies depression, SI/HI.  No other specific complaints in a complete review of systems (except as listed in HPI above).  Objective:   Physical Exam  BP 138/75 (BP Location: Right Arm, Patient Position: Sitting, Cuff Size: Normal)   Pulse 70   Temp (!) 97.3 F (36.3 C) (Temporal)   Resp 17   Ht 5\' 4"  (1.626 m)   Wt 176 lb (79.8 kg)   SpO2 99%   BMI 30.21 kg/m   Wt Readings from Last 3 Encounters:  08/09/21 179 lb 12.8 oz (81.6 kg)  08/03/21 176 lb (79.8 kg)  07/18/21 181 lb (82.1 kg)    General: Appears her stated age, obese, in NAD. Skin: Warm, dry and intact.  HEENT: Head: normal shape and size; Eyes: EOMs intact;  Cardiovascular: Normal rate and rhythm. S1,S2 noted.  No murmur, rubs or gallops noted. No JVD or BLE edema. No carotid bruits noted. Pulmonary/Chest: Normal effort and positive vesicular breath sounds. No respiratory distress. No wheezes, rales or ronchi noted.  Musculoskeletal: No difficulty with gait.  Neurological: Alert and oriented.  Psychiatric: Mood and affect mildly flat. Behavior is normal. Judgment and thought content normal.     BMET    Component Value Date/Time   NA 137 03/15/2021 1018   K 4.0 03/15/2021 1018   CL 104 03/15/2021 1018   CO2 23 03/15/2021 1018   GLUCOSE 86 03/15/2021 1018   BUN 12 03/15/2021 1018   CREATININE 0.80  03/15/2021 1018   CREATININE 0.87 01/31/2021 1002   CALCIUM 8.9 03/15/2021 1018   GFRNONAA >60 03/15/2021 1018   GFRNONAA 84 12/29/2019 1031   GFRAA 97 12/29/2019 1031    Lipid Panel     Component Value Date/Time   CHOL 219 (H) 01/31/2021 1002   TRIG 207 (H) 01/31/2021 1002   HDL 38 (L) 01/31/2021 1002   CHOLHDL 5.8 (H) 01/31/2021 1002   LDLCALC 147 (H) 01/31/2021 1002    CBC    Component Value Date/Time   WBC 12.4 (H) 03/15/2021 1018   RBC 4.49 03/15/2021 1018   HGB 13.1 03/15/2021 1018   HCT 38.6 03/15/2021 1018   PLT 284 03/15/2021 1018  MCV 86.0 03/15/2021 1018   MCH 29.2 03/15/2021 1018   MCHC 33.9 03/15/2021 1018   RDW 12.4 03/15/2021 1018   LYMPHSABS 2,888 01/31/2021 1002   EOSABS 452 01/31/2021 1002   BASOSABS 84 01/31/2021 1002    Hgb A1C Lab Results  Component Value Date   HGBA1C 5.6 01/31/2021           Assessment & Plan:     Katherine Silversmith, NP This visit occurred during the SARS-CoV-2 public health emergency.  Safety protocols were in place, including screening questions prior to the visit, additional usage of staff PPE, and extensive cleaning of exam room while observing appropriate contact time as indicated for disinfecting solutions.

## 2021-08-22 NOTE — Assessment & Plan Note (Signed)
Continue Rizatriptan as needed

## 2021-08-22 NOTE — Assessment & Plan Note (Signed)
Controlled on Losartan, refilled today Reinforced DASH diet and exercise for weight loss

## 2021-08-22 NOTE — Patient Instructions (Signed)

## 2021-08-23 LAB — LIPID PANEL
Cholesterol: 193 mg/dL (ref <200)
HDL: 36 mg/dL — ABNORMAL LOW (ref >=50)
LDL Cholesterol (Calc): 118 mg/dL (calc) — ABNORMAL HIGH
Non-HDL Cholesterol (Calc): 157 mg/dL (calc) — ABNORMAL HIGH (ref <130)
Total CHOL/HDL Ratio: 5.4 (calc) — ABNORMAL HIGH (ref <5.0)
Triglycerides: 270 mg/dL — ABNORMAL HIGH (ref <150)

## 2021-08-29 ENCOUNTER — Other Ambulatory Visit: Payer: Self-pay | Admitting: Internal Medicine

## 2021-08-29 DIAGNOSIS — R454 Irritability and anger: Secondary | ICD-10-CM

## 2021-08-29 DIAGNOSIS — I10 Essential (primary) hypertension: Secondary | ICD-10-CM

## 2021-08-29 NOTE — Telephone Encounter (Signed)
Requested medication (s) are due for refill today:   Yes for both  Requested medication (s) are on the active medication list:   Yes for both  Future visit scheduled:   No just seen by [facility] on 08/22/2021   Last ordered: Celexa 07/30/2021 #30, 0 refills;  Losartan 100 mg 07/30/2021 #30, 0 refills  Returned because pharmacy requesting an alternative.   Losartan not on formulary.   Requested Prescriptions  Pending Prescriptions Disp Refills   citalopram (CELEXA) 20 MG tablet [Pharmacy Med Name: CITALOPRAM 20MG  TABLETS] 30 tablet 0    Sig: TAKE 1 TABLET(20 MG) BY MOUTH DAILY. FOLLOW-UP APPOINTMENT AS NEEDED FOR ANXIETY     Psychiatry:  Antidepressants - SSRI Passed - 08/29/2021  7:08 AM      Passed - Valid encounter within last 6 months    Recent Outpatient Visits           1 week ago Mixed hyperlipidemia   [facility] [place], Coralie Keens, NP   1 month ago Sebaceous cyst of labia   [facility] [place], Coralie Keens, NP   7 months ago Routine general medical examination at a health care facility   [place], NP   1 year ago [facility] [place], Lupita Raider, FNP   1 year ago Hypertension, unspecified type   [facility], Lupita Raider, FNP               losartan (COZAAR) 100 MG tablet [Pharmacy Med Name: LOSARTAN 100MG  TABLETS] 30 tablet 0    Sig: TAKE 1 TABLET(100 MG) BY MOUTH DAILY     Cardiovascular:  Angiotensin Receptor Blockers Passed - 08/29/2021  7:08 AM      Passed - Cr in normal range and within 180 days    Creat  Date Value Ref Range Status  01/31/2021 0.87 0.50 - 1.10 mg/dL Final   Creatinine, Ser  Date Value Ref Range Status  03/15/2021 0.80 0.44 - 1.00 mg/dL Final          Passed - K in normal range and within 180 days    Potassium  Date Value Ref Range Status  03/15/2021 4.0 3.5 - 5.1 mmol/L Final          Passed - Patient is not pregnant      Passed -  Last BP in normal range    BP Readings from Last 1 Encounters:  08/22/21 138/75          Passed - Valid encounter within last 6 months    Recent Outpatient Visits           1 week ago Mixed hyperlipidemia   [place], Coralie Keens, NP   1 month ago Sebaceous cyst of labia   [facility] [place], Coralie Keens, NP   7 months ago Routine general medical examination at a health care facility   [place], NP   1 year ago [facility] [place], Lupita Raider, FNP   1 year ago Hypertension, unspecified type   [facility], Lupita Raider, [place]

## 2021-08-31 ENCOUNTER — Encounter (INDEPENDENT_AMBULATORY_CARE_PROVIDER_SITE_OTHER): Payer: Self-pay | Admitting: Adult Health

## 2021-08-31 ENCOUNTER — Ambulatory Visit (INDEPENDENT_AMBULATORY_CARE_PROVIDER_SITE_OTHER): Payer: BC Managed Care – PPO | Admitting: Adult Health

## 2021-08-31 ENCOUNTER — Other Ambulatory Visit: Payer: Self-pay

## 2021-08-31 VITALS — BP 118/66 | HR 71 | Temp 98.2°F | Ht 64.0 in | Wt 171.0 lb

## 2021-08-31 DIAGNOSIS — E669 Obesity, unspecified: Secondary | ICD-10-CM | POA: Diagnosis not present

## 2021-08-31 DIAGNOSIS — I10 Essential (primary) hypertension: Secondary | ICD-10-CM | POA: Diagnosis not present

## 2021-08-31 DIAGNOSIS — E559 Vitamin D deficiency, unspecified: Secondary | ICD-10-CM

## 2021-08-31 DIAGNOSIS — Z9189 Other specified personal risk factors, not elsewhere classified: Secondary | ICD-10-CM

## 2021-08-31 DIAGNOSIS — E782 Mixed hyperlipidemia: Secondary | ICD-10-CM

## 2021-08-31 DIAGNOSIS — Z6831 Body mass index (BMI) 31.0-31.9, adult: Secondary | ICD-10-CM

## 2021-08-31 MED ORDER — MOUNJARO 2.5 MG/0.5ML ~~LOC~~ SOAJ: SUBCUTANEOUS | 0 refills | Status: DC

## 2021-08-31 NOTE — Progress Notes (Addendum)
Chief Complaint:   OBESITY Katherine Foster is here to discuss her progress with her obesity treatment plan along with follow-up of her obesity related diagnoses. Katherine Foster is on keeping a food journal and adhering to recommended goals of 1200 calories and 95 grams of protein and states she is following her eating plan approximately 75% of the time. Katherine Foster states she is going to the gym for 60-90 minutes 3 times per week.  Today's visit was #: 6 Starting weight: 184 lbs Starting date: 04/18/2021 Today's weight: 171 lbs Today's date: 08/31/2021 Total lbs lost to date: 13 lbs Total lbs lost since last in-office visit: 5 lbs  Interim History:  Katherine Foster was started on Mounjaro 2.5 mg by Dr. Juleen China on 07/06/2021.   She has had 4 doses of Mounjaro 2.5 mg.   She denies mass in neck, dysphagia, dyspepsia, or persistent hoarseness.  Subjective:   1. Mixed hyperlipidemia She believes that both have/had hyperlipidemia. Her father passed away at age 49 - [place], paternal grandfater passed away at 41 - [place].   ASCVD risk stratification 1.8% today. ASCVD risk stratification 2.4% on 07/06/2021. Katherine Foster was started on Mounjaro 0.5 mg by Dr. Juleen China on 07/06/2021.   She has had 4 doses of Mounjaro 2.5 mg.   She denies mass in neck, dysphagia, dyspepsia, or persistent hoarseness. She vomited 3 times on Sunday, she takes Mounjaro injection on Saturday. She believes that she overeat at church on Sunday and that triggered the GI upset.  2. Essential hypertension BP/HR excellent at office visit. She denies dizziness or fatigue. She is on losartan 100 mg daily - managed by PCP.  3. Vitamin D deficiency She was previously on ergocalciferol - then stopped due to therapeutic vitamin D level. She is not on any vitamin D supplement currently.  4. At risk for heart disease Katherine Foster is at a higher than average risk for cardiovascular disease due to hyperlipidemia, hypertension, and obesity.    Assessment/Plan:   1. Mixed  hyperlipidemia Check labs every 3 months.  Refill Mounjaro 2.5 mg once weekly, as per below.  Remain on this dose until GI sx's improve  - Comprehensive metabolic panel - Hemoglobin A1c - Insulin, random - Refill MOUNJARO 2.5 MG/0.5ML Pen; SMARTSIG:2.5 Milligram(s) SUB-Q Once a Week  Dispense: 2 mL; Refill: 0  2. Essential hypertension Check labs today.  - Comprehensive metabolic panel  3. Vitamin D deficiency Will check vitamin D level today.  - VITAMIN D 25 Hydroxy (Vit-D Deficiency, Fractures)  4. At risk for heart disease Katherine Foster was given approximately 15 minutes of coronary artery disease prevention counseling today. She is 45 y.o. female and has risk factors for heart disease including obesity. We discussed intensive lifestyle modifications today with an emphasis on specific weight loss instructions and strategies.   Repetitive spaced learning was employed today to elicit superior memory formation and behavioral change.   5. Obesity, current BMI 30.3  Katherine Foster is currently in the action stage of change. As such, her goal is to continue with weight loss efforts. She has agreed to keeping a food journal and adhering to recommended goals of 1200 calories and 95 grams of protein.   Increase vegetable intake.  Increase water intake.  Exercise goals:  As is.  Behavioral modification strategies: increasing lean protein intake, decreasing simple carbohydrates, increasing water intake, meal planning and cooking strategies, keeping healthy foods in the home, planning for success, and keeping a strict food journal.  Katherine Foster has agreed to follow-up with our  clinic in 5 weeks. She was informed of the importance of frequent follow-up visits to maximize her success with intensive lifestyle modifications for her multiple health conditions.   Katherine Foster was informed we would discuss her lab results at her next visit unless there is a critical issue that needs to be addressed sooner. Katherine Foster agreed  to keep her next visit at the agreed upon time to discuss these results.  Objective:   Blood pressure 118/66, pulse 71, temperature 98.2 F (36.8 C), height 5\' 4"  (1.626 m), weight 171 lb (77.6 kg), SpO2 100 %. Body mass index is 29.35 kg/m.  General: Cooperative, alert, well developed, in no acute distress. HEENT: Conjunctivae and lids unremarkable. Cardiovascular: Regular rhythm.  Lungs: Normal work of breathing. Neurologic: No focal deficits.   Lab Results  Component Value Date   CREATININE 0.80 03/15/2021   BUN 12 03/15/2021   NA 137 03/15/2021   K 4.0 03/15/2021   CL 104 03/15/2021   CO2 23 03/15/2021   Lab Results  Component Value Date   ALT 21 03/15/2021   AST 15 03/15/2021   ALKPHOS 65 03/15/2021   BILITOT 0.6 03/15/2021   Lab Results  Component Value Date   HGBA1C 5.6 01/31/2021   Lab Results  Component Value Date   TSH 2.00 01/31/2021   Lab Results  Component Value Date   CHOL 193 08/22/2021   HDL 36 (L) 08/22/2021   LDLCALC 118 (H) 08/22/2021   TRIG 270 (H) 08/22/2021   CHOLHDL 5.4 (H) 08/22/2021   Lab Results  Component Value Date   VD25OH 51 05/16/2021   VD25OH 13 (L) 01/31/2021   Lab Results  Component Value Date   WBC 12.4 (H) 03/15/2021   HGB 13.1 03/15/2021   HCT 38.6 03/15/2021   MCV 86.0 03/15/2021   PLT 284 03/15/2021   Attestation Statements:   Reviewed by clinician on day of visit: allergies, medications, problem list, medical history, surgical history, family history, social history, and previous encounter notes.  I, Water quality scientist, CMA, am acting as Location manager for Mina Marble, NP.  I have reviewed the above documentation for accuracy and completeness, and I agree with the above. -  Gaby Harney d. Deklyn Trachtenberg, NP-C

## 2021-09-01 LAB — COMPREHENSIVE METABOLIC PANEL
ALT: 20 IU/L (ref 0–32)
AST: 14 IU/L (ref 0–40)
Albumin/Globulin Ratio: 1.9 (ref 1.2–2.2)
Albumin: 4.5 g/dL (ref 3.8–4.8)
Alkaline Phosphatase: 87 IU/L (ref 44–121)
BUN/Creatinine Ratio: 13 (ref 9–23)
BUN: 11 mg/dL (ref 6–24)
Bilirubin Total: 0.3 mg/dL (ref 0.0–1.2)
CO2: 23 mmol/L (ref 20–29)
Calcium: 9.6 mg/dL (ref 8.7–10.2)
Chloride: 101 mmol/L (ref 96–106)
Creatinine, Ser: 0.88 mg/dL (ref 0.57–1.00)
Globulin, Total: 2.4 g/dL (ref 1.5–4.5)
Glucose: 84 mg/dL (ref 70–99)
Potassium: 4.9 mmol/L (ref 3.5–5.2)
Sodium: 139 mmol/L (ref 134–144)
Total Protein: 6.9 g/dL (ref 6.0–8.5)
eGFR: 82 mL/min/{1.73_m2} (ref >59)

## 2021-09-01 LAB — INSULIN, RANDOM: INSULIN: 24.6 u[IU]/mL (ref 2.6–24.9)

## 2021-09-01 LAB — VITAMIN D 25 HYDROXY (VIT D DEFICIENCY, FRACTURES): Vit D, 25-Hydroxy: 18.7 ng/mL — ABNORMAL LOW (ref 30.0–100.0)

## 2021-09-01 LAB — HEMOGLOBIN A1C
Est. average glucose Bld gHb Est-mCnc: 108 mg/dL
Hgb A1c MFr Bld: 5.4 % (ref 4.8–5.6)

## 2021-09-09 IMAGING — DX DG KNEE AP/LAT W/ SUNRISE*L*
3 series · 3 of 3 positions shown · non-contrast
Comparison: None.

CLINICAL DATA: Chronic bilateral knee pain for 6 months.

EXAM:
LEFT KNEE 3 VIEWS

[knee ap]
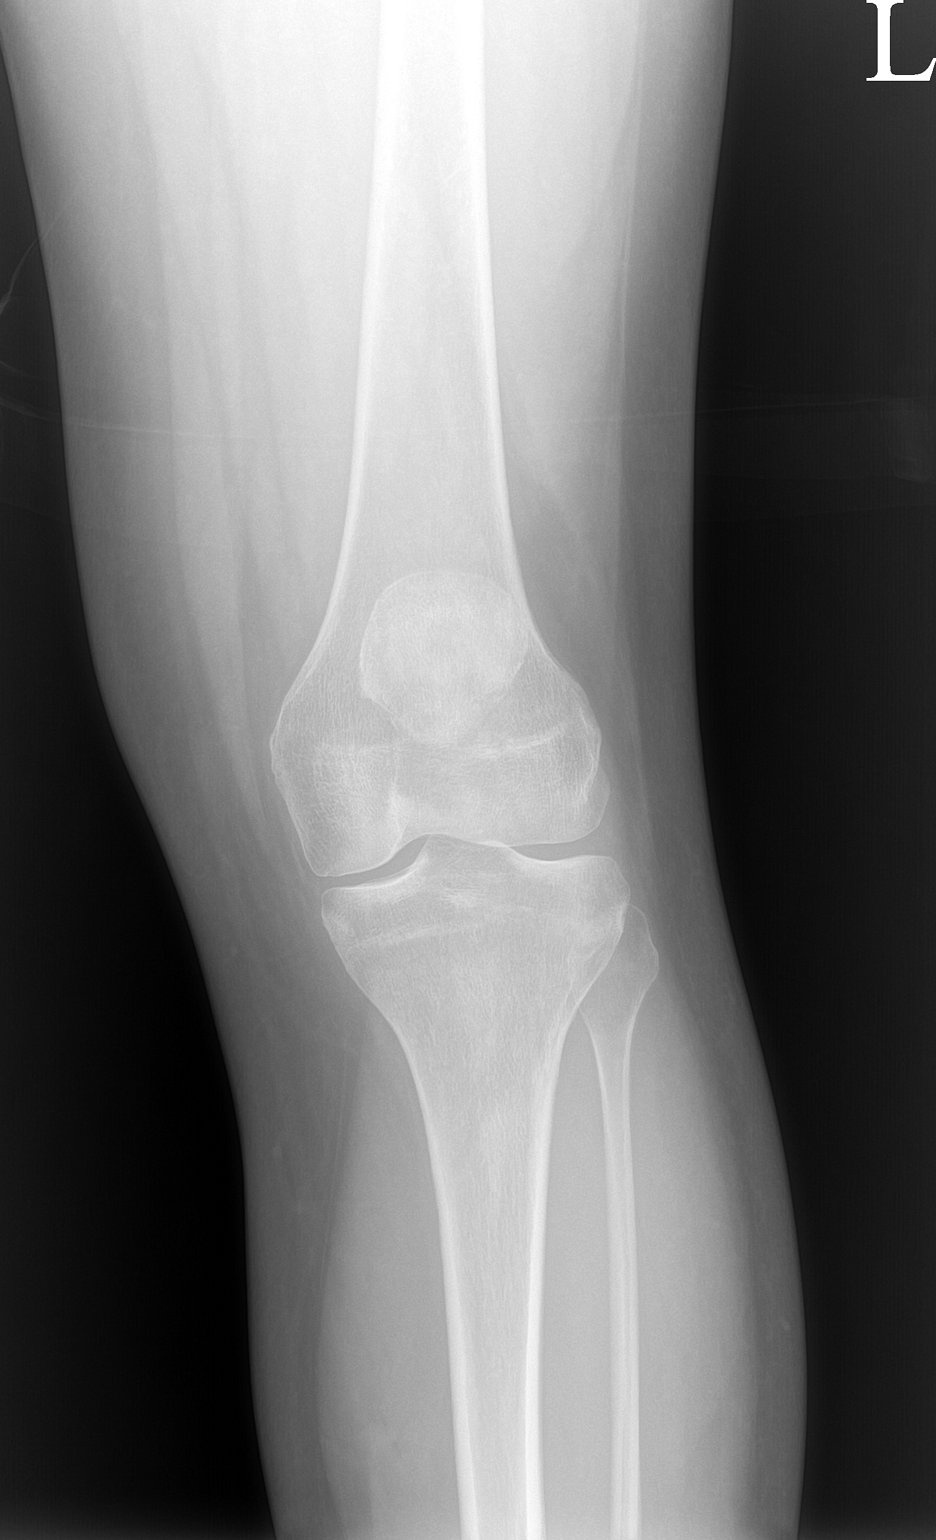

[knee lat]
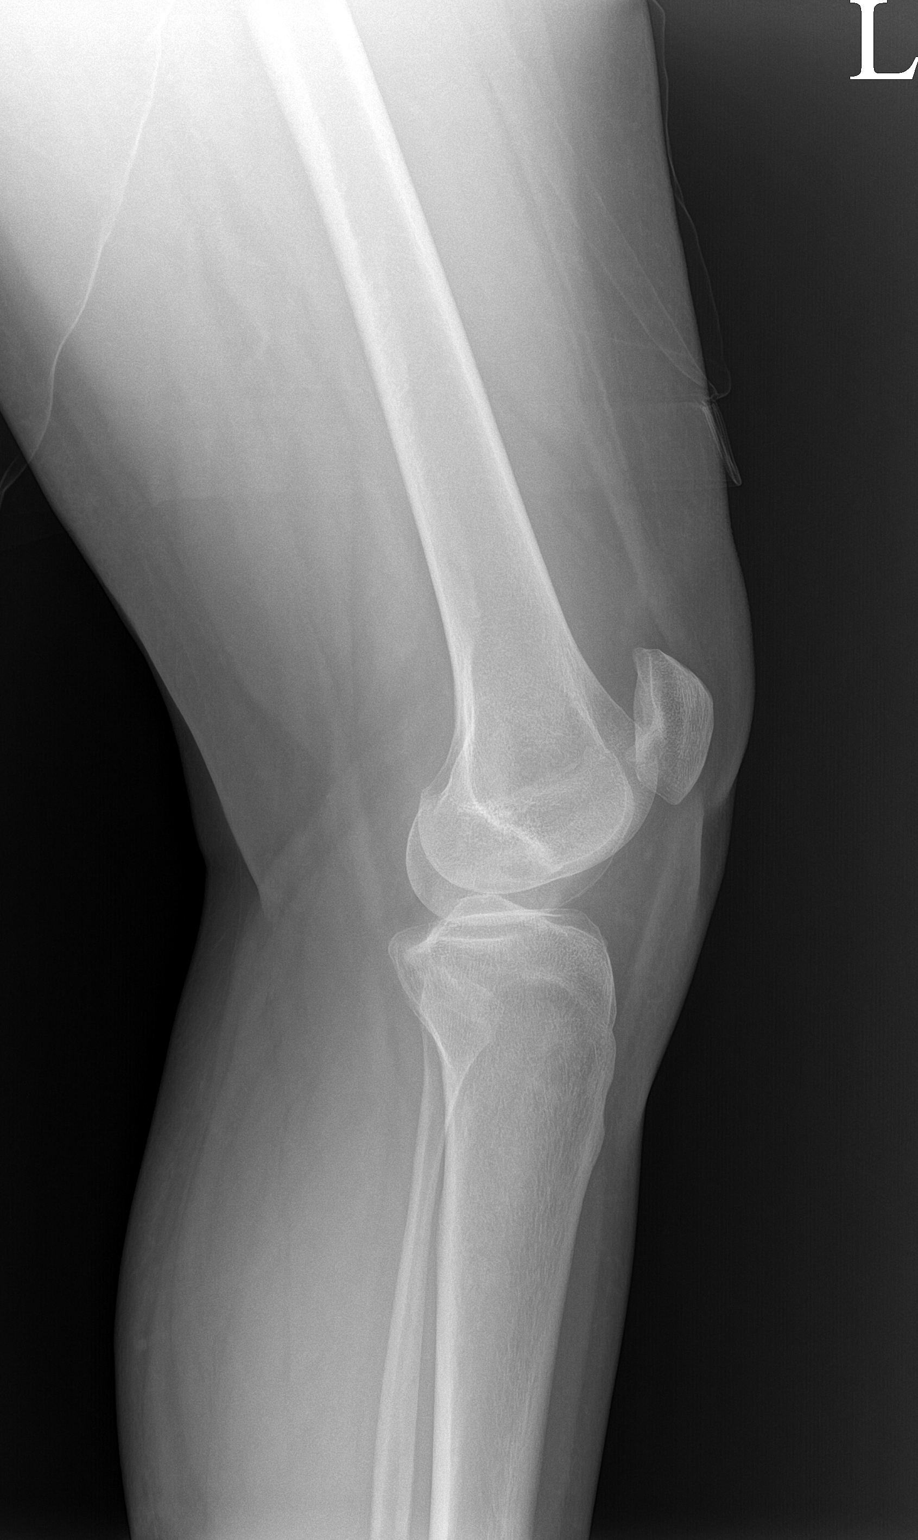

[patella]
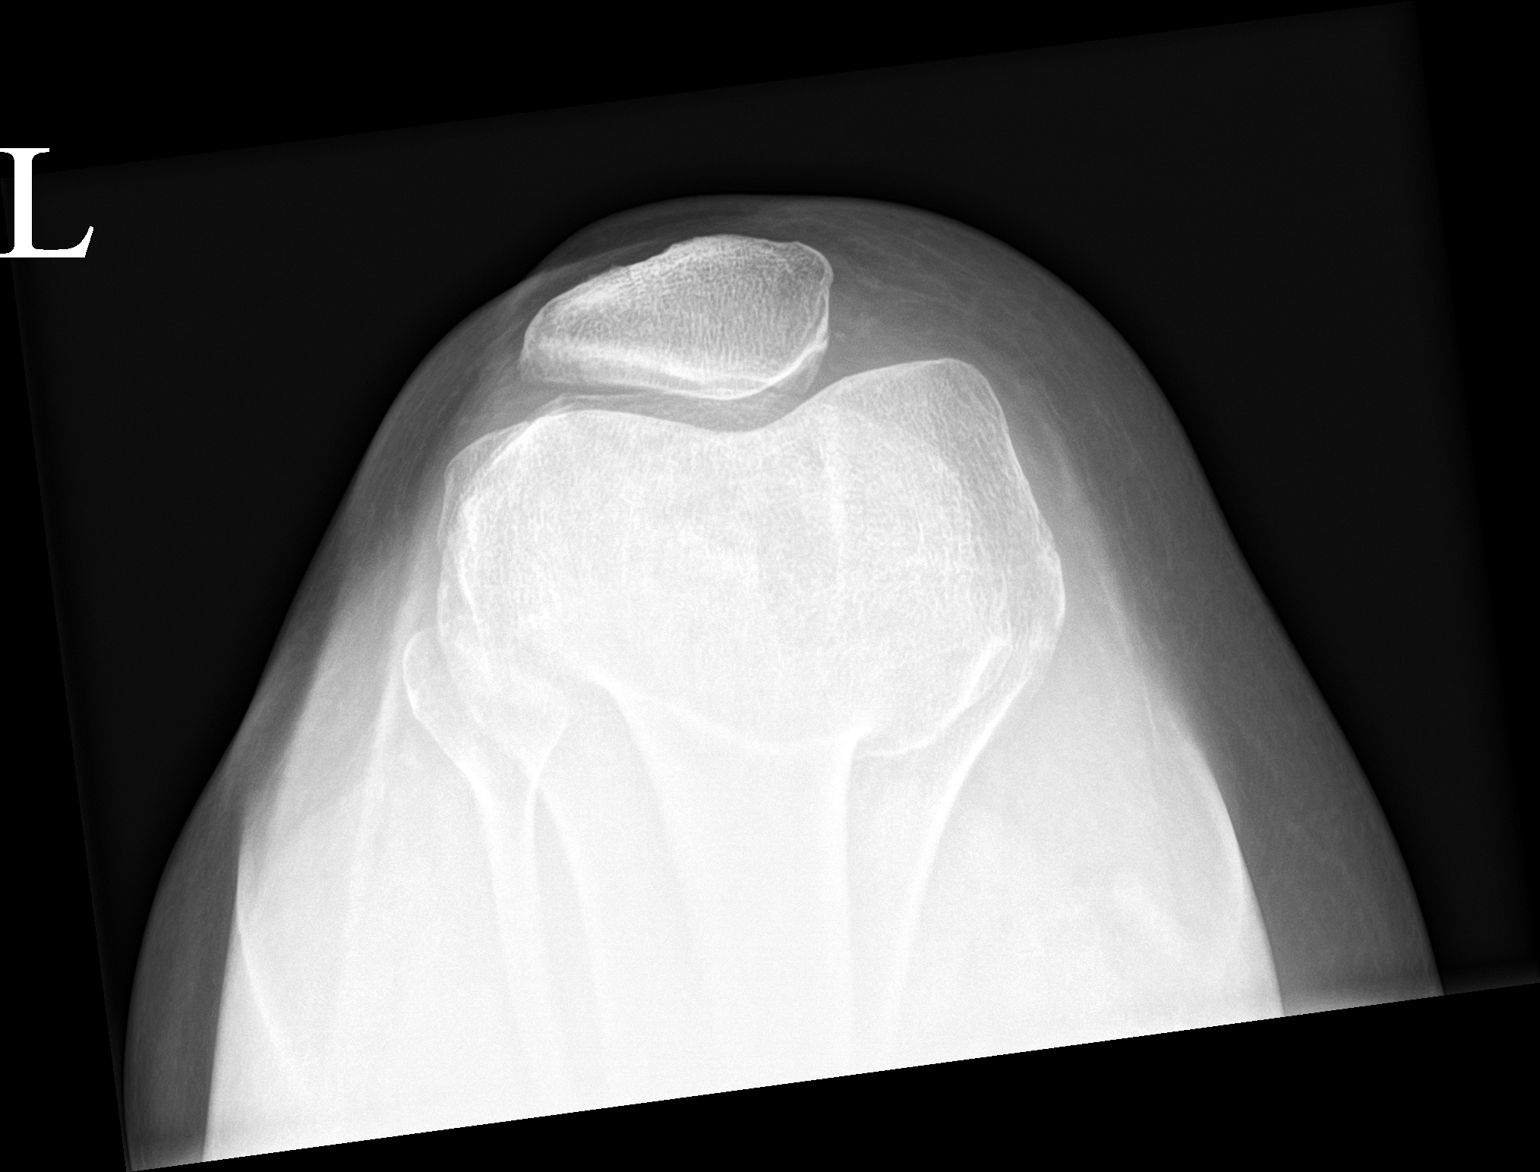

[3 of 3 positions shown; findings below may reference images not displayed]

FINDINGS: No evidence of fracture, dislocation, or joint effusion. Normal
alignment and joint spaces. No evidence of arthropathy or other
focal bone abnormality. Soft tissues are unremarkable.
IMPRESSION: Unremarkable radiographs of the left knee.

## 2021-09-09 IMAGING — DX DG KNEE AP/LAT W/ SUNRISE*R*
3 series · 3 of 3 positions shown · non-contrast
Comparison: None.

CLINICAL DATA: Chronic bilateral knee pain for 6 months.

EXAM:
RIGHT KNEE 3 VIEWS

[knee ap]
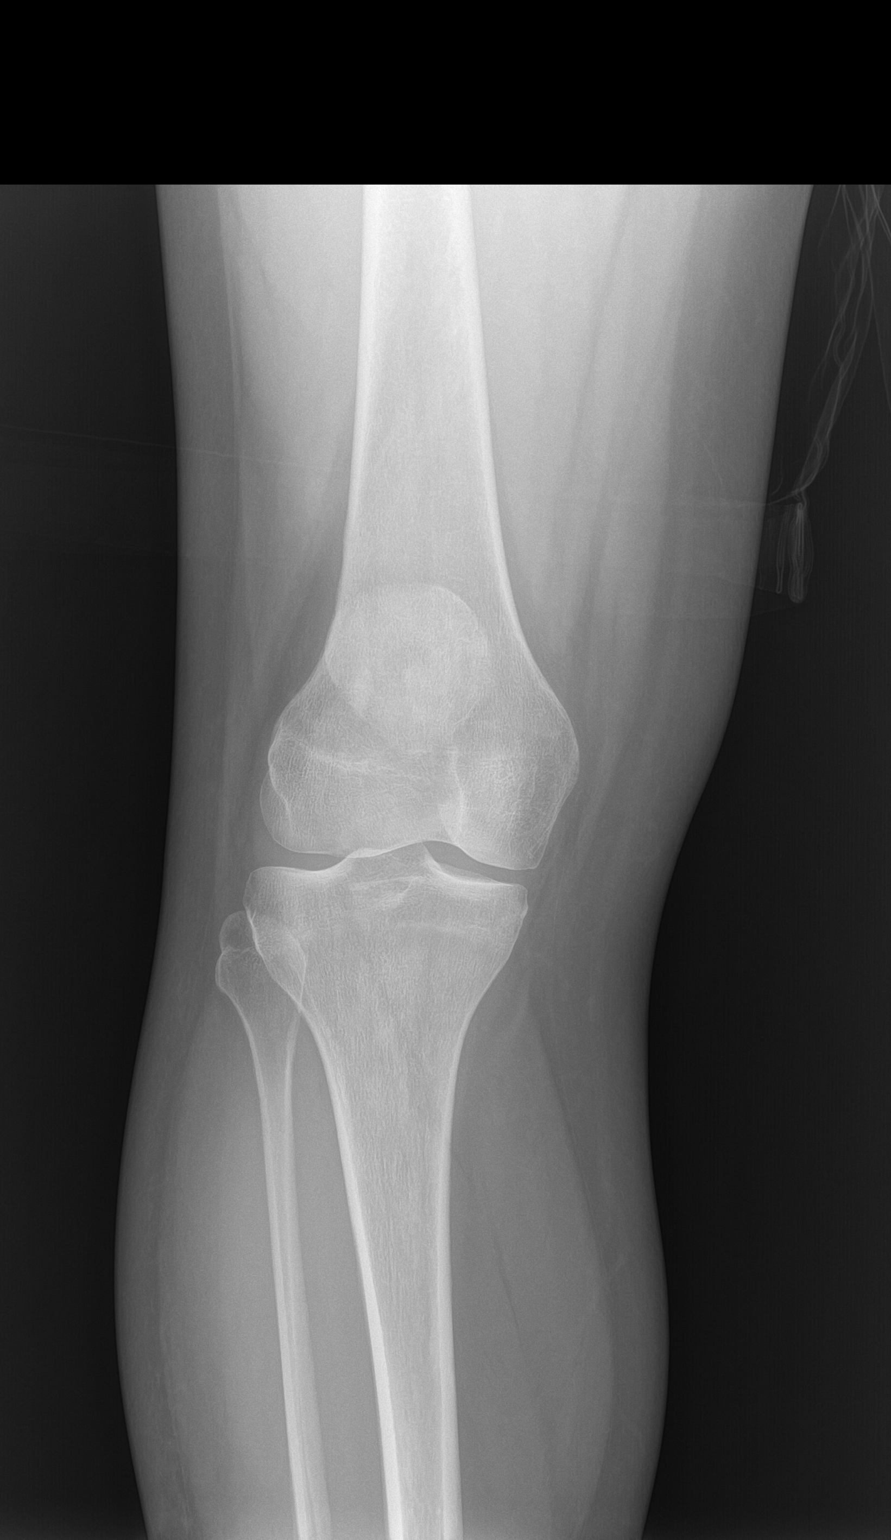

[knee lat]
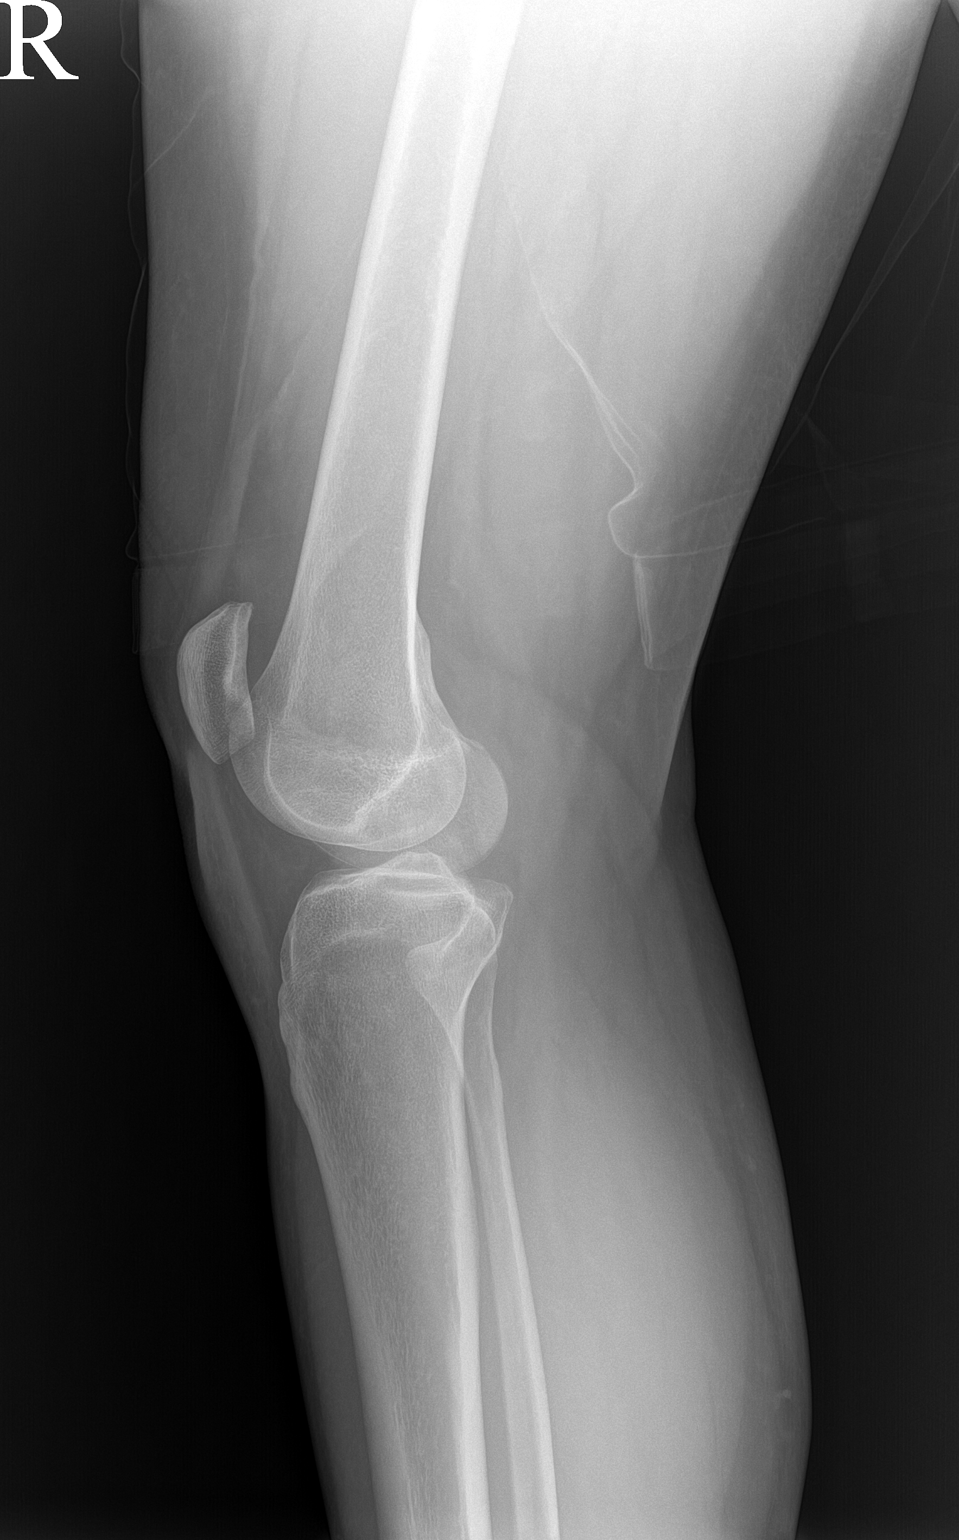

[patella]
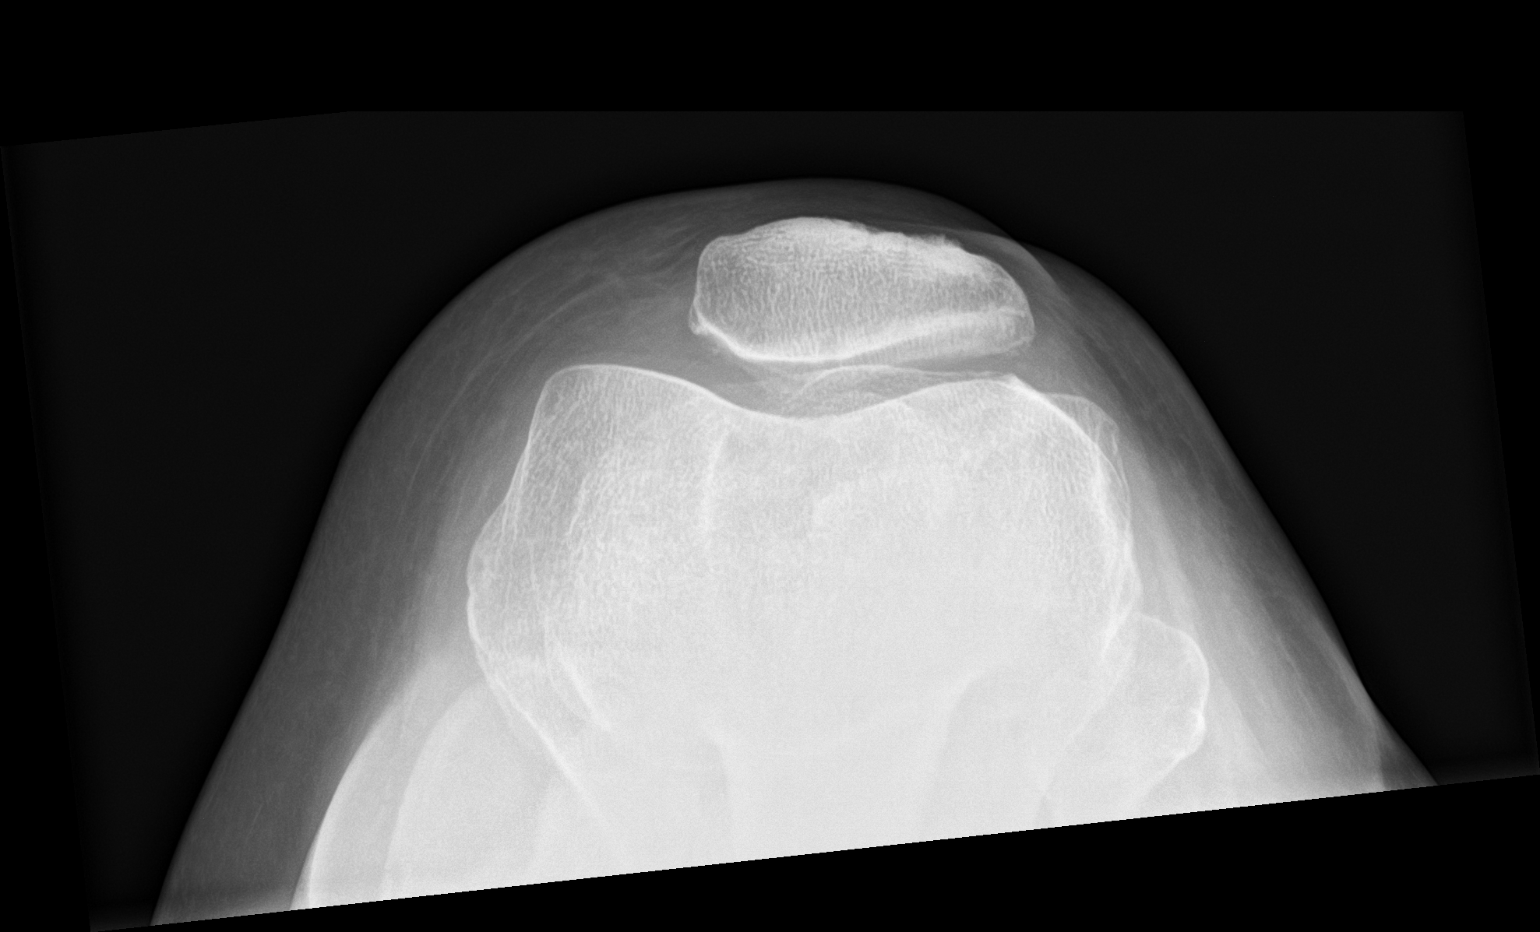

[3 of 3 positions shown; findings below may reference images not displayed]

FINDINGS: No evidence of fracture, dislocation, or joint effusion. Normal
joint spaces and alignment. No evidence of arthropathy or other
focal bone abnormality. Soft tissues are unremarkable.
IMPRESSION: Unremarkable radiographs of the right knee.

## 2021-10-03 ENCOUNTER — Other Ambulatory Visit: Payer: Self-pay

## 2021-10-03 ENCOUNTER — Encounter (INDEPENDENT_AMBULATORY_CARE_PROVIDER_SITE_OTHER): Payer: Self-pay | Admitting: Adult Health

## 2021-10-03 ENCOUNTER — Ambulatory Visit (INDEPENDENT_AMBULATORY_CARE_PROVIDER_SITE_OTHER): Payer: BC Managed Care – PPO | Admitting: Adult Health

## 2021-10-03 VITALS — BP 121/67 | HR 79 | Temp 98.1°F | Ht 64.0 in | Wt 169.0 lb

## 2021-10-03 DIAGNOSIS — E559 Vitamin D deficiency, unspecified: Secondary | ICD-10-CM

## 2021-10-03 DIAGNOSIS — E669 Obesity, unspecified: Secondary | ICD-10-CM

## 2021-10-03 DIAGNOSIS — E8881 Metabolic syndrome: Secondary | ICD-10-CM | POA: Diagnosis not present

## 2021-10-03 DIAGNOSIS — I10 Essential (primary) hypertension: Secondary | ICD-10-CM | POA: Diagnosis not present

## 2021-10-03 DIAGNOSIS — E782 Mixed hyperlipidemia: Secondary | ICD-10-CM | POA: Diagnosis not present

## 2021-10-03 DIAGNOSIS — Z6831 Body mass index (BMI) 31.0-31.9, adult: Secondary | ICD-10-CM

## 2021-10-03 DIAGNOSIS — Z9189 Other specified personal risk factors, not elsewhere classified: Secondary | ICD-10-CM

## 2021-10-03 MED ORDER — TIRZEPATIDE 5 MG/0.5ML ~~LOC~~ SOAJ
5.0000 mg | SUBCUTANEOUS | 0 refills | Status: DC
Start: 2021-10-03 — End: 2021-11-13

## 2021-10-03 MED ORDER — VITAMIN D (ERGOCALCIFEROL) 1.25 MG (50000 UNIT) PO CAPS: 50000.0000 [IU] | ORAL_CAPSULE | ORAL | 0 refills | Status: DC

## 2021-10-03 NOTE — Progress Notes (Signed)
Chief Complaint:   OBESITY Katherine Foster is here to discuss her progress with her obesity treatment plan along with follow-up of her obesity related diagnoses. Katherine Foster is on keeping a food journal and adhering to recommended goals of 1200 calories and 95 grams of protein and states she is following her eating plan approximately 50% of the time. [name] states she is going to the gym for 90 minutes 2 times per week.  Today's visit was #: 7 Starting weight: 184 lbs Starting date: 04/18/2021 Today's weight: 169 lbs Today's date: 10/03/2021 Total lbs lost to date: 15 lbs Total lbs lost since last in-office visit: 2 lbs  Interim History: Katherine Foster started Mounjaro 2.5 mg on 07/06/2021. Due to GI upset (N/V) she has remained on 2.5 mg weekly dose. With her last two 2.5 mg doses - no GI upset. She estimates to consume 2,000 calories per day 3-4 days per week.  Subjective:   1. Mixed hyperlipidemia Discussed labs with patient today.  Mikaela's mother and father - hyperlipidemia. ASCVD risk stratification 1.9%. She has never been on statin therapy.  2. Essential hypertension Discussed labs with patient today.  BP/HR excellent.  3. Insulin resistance Discussed labs with patient today.  On 08/31/2021, BG 84, A1c 5.4, insulin level 24.6. She was started on Mounjaro 2.5 mg on 07/06/2021. Previous GU upset (N/V) has not occurred after last two injections. She denies mass in neck, dysphagia, dyspepsia, or persistent hoarseness.  4. Vitamin D deficiency Discussed labs with patient today.  On 08/31/2021, vitamin D level - 18.7 - well below goal of 50. She is not on any vitamin D supplement.  5. At risk for nausea Katherine Foster is at risk for nausea due to increasing Mounjaro.  Assessment/Plan:   1. Mixed hyperlipidemia Do not overeat. Increase and refill Mounjaro to 5 mg once weekly.  - Increase and refill tirzepatide (MOUNJARO) 5 MG/0.5ML Pen; Inject 5 mg into the skin once a week.  Dispense: 6 mL;  Refill: 0  2. Essential hypertension Continue current antihypertensives.   3. Insulin resistance Continue Mounjaro.  Do not overeat.  4. Vitamin D deficiency Start ergocalciferol 50,000 IU once weekly, as per below.  - Start Vitamin D, Ergocalciferol, (DRISDOL) 1.25 MG (50000 UNIT) CAPS capsule; Take 1 capsule (50,000 Units total) by mouth every 7 (seven) days.  Dispense: 8 capsule; Refill: 0  5. At risk for nausea Katherine Foster was given approximately 15 minutes of nausea prevention counseling today. Katherine Foster is at risk for nausea due to her new or current medication. She was encouraged to titrate her medication slowly, make sure to stay hydrated, eat smaller portions throughout the day, and avoid high fat meals.   6. Obesity, current BMI 29.1  Katherine Foster is currently in the action stage of change. As such, her goal is to continue with weight loss efforts. She has agreed to keeping a food journal and adhering to recommended goals of 1200 calories and 95 grams of protein.   Exercise goals:  As is.  Behavioral modification strategies: increasing lean protein intake, decreasing simple carbohydrates, meal planning and cooking strategies, keeping healthy foods in the home, planning for success, and keeping a strict food journal.  Katherine Foster has agreed to follow-up with our clinic in 4 weeks. She was informed of the importance of frequent follow-up visits to maximize her success with intensive lifestyle modifications for her multiple health conditions.   Objective:   Blood pressure 121/67, pulse 79, temperature 98.1 F (36.7 C), height 5\' 4"  (  1.626 m), weight 169 lb (76.7 kg), SpO2 100 %. Body mass index is 29.01 kg/m.  General: Cooperative, alert, well developed, in no acute distress. HEENT: Conjunctivae and lids unremarkable. Cardiovascular: Regular rhythm.  Lungs: Normal work of breathing. Neurologic: No focal deficits.   Lab Results  Component Value Date   CREATININE 0.88 08/31/2021    BUN 11 08/31/2021   NA 139 08/31/2021   K 4.9 08/31/2021   CL 101 08/31/2021   CO2 23 08/31/2021   Lab Results  Component Value Date   ALT 20 08/31/2021   AST 14 08/31/2021   ALKPHOS 87 08/31/2021   BILITOT 0.3 08/31/2021   Lab Results  Component Value Date   HGBA1C 5.4 08/31/2021   HGBA1C 5.6 01/31/2021   Lab Results  Component Value Date   INSULIN 24.6 08/31/2021   Lab Results  Component Value Date   TSH 2.00 01/31/2021   Lab Results  Component Value Date   CHOL 193 08/22/2021   HDL 36 (L) 08/22/2021   LDLCALC 118 (H) 08/22/2021   TRIG 270 (H) 08/22/2021   CHOLHDL 5.4 (H) 08/22/2021   Lab Results  Component Value Date   VD25OH 18.7 (L) 08/31/2021   VD25OH 51 05/16/2021   VD25OH 13 (L) 01/31/2021   Lab Results  Component Value Date   WBC 12.4 (H) 03/15/2021   HGB 13.1 03/15/2021   HCT 38.6 03/15/2021   MCV 86.0 03/15/2021   PLT 284 03/15/2021   Attestation Statements:   Reviewed by clinician on day of visit: allergies, medications, problem list, medical history, surgical history, family history, social history, and previous encounter notes.  I, Water quality scientist, CMA, am acting as Location manager for Mina Marble, NP.  I have reviewed the above documentation for accuracy and completeness, and I agree with the above. -  Lido Maske d. Tylene Quashie, NP-C

## 2021-10-04 ENCOUNTER — Encounter (INDEPENDENT_AMBULATORY_CARE_PROVIDER_SITE_OTHER): Payer: Self-pay | Admitting: Adult Health

## 2021-10-04 NOTE — Telephone Encounter (Signed)
Please advise. Pt does not have a f/u until 11/02/21 and does not have a dx of DM.

## 2021-10-24 DIAGNOSIS — N611 Abscess of the breast and nipple: Secondary | ICD-10-CM | POA: Diagnosis not present

## 2021-11-01 ENCOUNTER — Ambulatory Visit (INDEPENDENT_AMBULATORY_CARE_PROVIDER_SITE_OTHER): Payer: BC Managed Care – PPO | Admitting: Adult Health

## 2021-11-01 ENCOUNTER — Encounter (INDEPENDENT_AMBULATORY_CARE_PROVIDER_SITE_OTHER): Payer: Self-pay

## 2021-11-13 ENCOUNTER — Other Ambulatory Visit: Payer: Self-pay

## 2021-11-13 ENCOUNTER — Encounter (INDEPENDENT_AMBULATORY_CARE_PROVIDER_SITE_OTHER): Payer: Self-pay | Admitting: Family Medicine

## 2021-11-13 ENCOUNTER — Ambulatory Visit (INDEPENDENT_AMBULATORY_CARE_PROVIDER_SITE_OTHER): Payer: BC Managed Care – PPO | Admitting: Family Medicine

## 2021-11-13 VITALS — BP 109/70 | HR 81 | Temp 98.2°F | Ht 64.0 in | Wt 167.0 lb

## 2021-11-13 DIAGNOSIS — E8881 Metabolic syndrome: Secondary | ICD-10-CM

## 2021-11-13 DIAGNOSIS — Z6831 Body mass index (BMI) 31.0-31.9, adult: Secondary | ICD-10-CM

## 2021-11-13 DIAGNOSIS — E782 Mixed hyperlipidemia: Secondary | ICD-10-CM

## 2021-11-13 DIAGNOSIS — E559 Vitamin D deficiency, unspecified: Secondary | ICD-10-CM | POA: Diagnosis not present

## 2021-11-13 DIAGNOSIS — I10 Essential (primary) hypertension: Secondary | ICD-10-CM | POA: Diagnosis not present

## 2021-11-13 DIAGNOSIS — Z6829 Body mass index (BMI) 29.0-29.9, adult: Secondary | ICD-10-CM

## 2021-11-13 DIAGNOSIS — E669 Obesity, unspecified: Secondary | ICD-10-CM

## 2021-11-13 DIAGNOSIS — Z9189 Other specified personal risk factors, not elsewhere classified: Secondary | ICD-10-CM

## 2021-11-13 MED ORDER — TIRZEPATIDE 2.5 MG/0.5ML ~~LOC~~ SOAJ: 2.5000 mg | SUBCUTANEOUS | 0 refills | Status: DC

## 2021-11-14 MED ORDER — VITAMIN D (ERGOCALCIFEROL) 1.25 MG (50000 UNIT) PO CAPS: 50000.0000 [IU] | ORAL_CAPSULE | ORAL | 0 refills | Status: DC

## 2021-11-14 NOTE — Telephone Encounter (Signed)
L/mess for pt to call-[facility]

## 2021-11-14 NOTE — Telephone Encounter (Signed)
LOV w/ Dr. U

## 2021-11-14 NOTE — Progress Notes (Signed)
Chief Complaint:   OBESITY Katherine Foster is here to discuss her progress with her obesity treatment plan along with follow-up of her obesity related diagnoses. Katherine Foster is on keeping a food journal and adhering to recommended goals of 1200 calories and 95 grams protein and states she is following her eating plan approximately 50% of the time. Katherine Foster states she is going to the gym 90 minutes 2 times per week.  Today's visit was #: 8 Starting weight: 184 lbs Starting date: 04/18/2021 Today's weight: 171 lbs Today's date: 11/13/2021 Total lbs lost to date: 13 Total lbs lost since last in-office visit: 0  Interim History: Pt found herself eating more and exercising less over the holidays. Her mother lives with her and has had health issues so pt has been limited on time. She has not logged or downloaded MyFitnessPal. Pt started [facility] but has struggled to get Rx, as pharmacy declined to fill.  Subjective:   1. Insulin resistance Pt was previously doing well on Mounjaro. She reports increase in appetite with cessation.  2. Essential hypertension BP very well controlled. Pt denies chest pain/chest pressure/headache. She is on Cozaar.  3. At risk of diabetes mellitus Rendy is at higher than average risk for developing diabetes due to obesity.   Assessment/Plan:   1. Insulin resistance Katherine Foster will continue to work on weight loss, exercise, and decreasing simple carbohydrates to help decrease the risk of diabetes. Teliyah agreed to follow-up with Katherine Foster as directed to closely monitor her progress.  Refill- tirzepatide (MOUNJARO) 2.5 MG/0.5ML Pen; Inject 2.5 mg into the skin once a week.  Dispense: 2 mL; Refill: 0  2. Essential hypertension Tionne is working on healthy weight loss and exercise to improve blood pressure control. We will watch for signs of hypotension as she continues her lifestyle modifications. Continue Cozaar. Follow up on BP at next appt.   3. At risk of diabetes  mellitus Katherine Foster was given approximately 15 minutes of diabetes education and counseling today. We discussed intensive lifestyle modifications today with an emphasis on weight loss as well as increasing exercise and decreasing simple carbohydrates in her diet. We also reviewed medication options with an emphasis on risk versus benefit of those discussed.   Repetitive spaced learning was employed today to elicit superior memory formation and behavioral change.  4. Obesity with current BMI of 29.5  Katherine Foster is currently in the action stage of change. As such, her goal is to continue with weight loss efforts. She has agreed to keeping a food journal and adhering to recommended goals of 1300-1450 calories and 85+ grams protein.   Exercise goals: No exercise has been prescribed at this time.  Behavioral modification strategies: increasing lean protein intake, meal planning and cooking strategies, keeping healthy foods in the home, and planning for success.  Katherine Foster has agreed to follow-up with our clinic in 2-3 weeks. She was informed of the importance of frequent follow-up visits to maximize her success with intensive lifestyle modifications for her multiple health conditions.   Objective:   Blood pressure 109/70, pulse 81, temperature 98.2 F (36.8 C), height 5\' 4"  (1.626 m), weight 167 lb (75.8 kg), SpO2 100 %. Body mass index is 28.67 kg/m.  General: Cooperative, alert, well developed, in no acute distress. HEENT: Conjunctivae and lids unremarkable. Cardiovascular: Regular rhythm.  Lungs: Normal work of breathing. Neurologic: No focal deficits.   Lab Results  Component Value Date   CREATININE 0.88 08/31/2021   BUN 11 08/31/2021   NA 139  08/31/2021   K 4.9 08/31/2021   CL 101 08/31/2021   CO2 23 08/31/2021   Lab Results  Component Value Date   ALT 20 08/31/2021   AST 14 08/31/2021   ALKPHOS 87 08/31/2021   BILITOT 0.3 08/31/2021   Lab Results  Component Value Date   HGBA1C 5.4  08/31/2021   HGBA1C 5.6 01/31/2021   Lab Results  Component Value Date   INSULIN 24.6 08/31/2021   Lab Results  Component Value Date   TSH 2.00 01/31/2021   Lab Results  Component Value Date   CHOL 193 08/22/2021   HDL 36 (L) 08/22/2021   LDLCALC 118 (H) 08/22/2021   TRIG 270 (H) 08/22/2021   CHOLHDL 5.4 (H) 08/22/2021   Lab Results  Component Value Date   VD25OH 18.7 (L) 08/31/2021   VD25OH 51 05/16/2021   VD25OH 13 (L) 01/31/2021   Lab Results  Component Value Date   WBC 12.4 (H) 03/15/2021   HGB 13.1 03/15/2021   HCT 38.6 03/15/2021   MCV 86.0 03/15/2021   PLT 284 03/15/2021    Attestation Statements:   Reviewed by clinician on day of visit: allergies, medications, problem list, medical history, surgical history, family history, social history, and previous encounter notes.  Coral Ceo, CMA, am acting as transcriptionist for Coralie Common, MD.   I have reviewed the above documentation for accuracy and completeness, and I agree with the above. - Coralie Common, MD

## 2021-11-27 ENCOUNTER — Encounter (INDEPENDENT_AMBULATORY_CARE_PROVIDER_SITE_OTHER): Payer: Self-pay

## 2021-11-29 ENCOUNTER — Ambulatory Visit (INDEPENDENT_AMBULATORY_CARE_PROVIDER_SITE_OTHER): Payer: BC Managed Care – PPO | Admitting: Family Medicine

## 2021-11-29 ENCOUNTER — Encounter (INDEPENDENT_AMBULATORY_CARE_PROVIDER_SITE_OTHER): Payer: Self-pay | Admitting: Family Medicine

## 2021-11-29 ENCOUNTER — Other Ambulatory Visit: Payer: Self-pay

## 2021-11-29 VITALS — BP 127/75 | HR 68 | Temp 98.0°F | Ht 64.0 in | Wt 171.0 lb

## 2021-11-29 DIAGNOSIS — E559 Vitamin D deficiency, unspecified: Secondary | ICD-10-CM | POA: Diagnosis not present

## 2021-11-29 DIAGNOSIS — E8881 Metabolic syndrome: Secondary | ICD-10-CM

## 2021-11-29 DIAGNOSIS — E669 Obesity, unspecified: Secondary | ICD-10-CM

## 2021-11-29 DIAGNOSIS — I10 Essential (primary) hypertension: Secondary | ICD-10-CM | POA: Diagnosis not present

## 2021-11-29 DIAGNOSIS — Z6829 Body mass index (BMI) 29.0-29.9, adult: Secondary | ICD-10-CM

## 2021-11-29 DIAGNOSIS — E782 Mixed hyperlipidemia: Secondary | ICD-10-CM

## 2021-11-29 DIAGNOSIS — Z6831 Body mass index (BMI) 31.0-31.9, adult: Secondary | ICD-10-CM

## 2021-11-29 DIAGNOSIS — E88819 Insulin resistance, unspecified: Secondary | ICD-10-CM

## 2021-11-30 MED ORDER — TIRZEPATIDE 2.5 MG/0.5ML ~~LOC~~ SOAJ: 2.5000 mg | SUBCUTANEOUS | 3 refills | Status: DC

## 2021-11-30 MED ORDER — VITAMIN D (ERGOCALCIFEROL) 1.25 MG (50000 UNIT) PO CAPS: 50000.0000 [IU] | ORAL_CAPSULE | ORAL | 0 refills | Status: DC

## 2021-12-02 ENCOUNTER — Encounter: Payer: Self-pay | Admitting: Internal Medicine

## 2021-12-02 DIAGNOSIS — R053 Chronic cough: Secondary | ICD-10-CM

## 2021-12-04 MED ORDER — GABAPENTIN 300 MG PO CAPS: 300.0000 mg | ORAL_CAPSULE | Freq: Two times a day (BID) | ORAL | 0 refills | Status: DC | PRN

## 2021-12-07 NOTE — Progress Notes (Signed)
Chief Complaint:   OBESITY Katherine Foster is here to discuss her progress with her obesity treatment plan along with follow-up of her obesity related diagnoses. See Medical Weight Management Flowsheet for complete bioelectrical impedance results.  Today's visit was #: 9 Starting weight: 184 lbs Starting date: 04/18/2021 Weight change since last visit: +4 lbs Total lbs lost to date: 13 lbs Total weight loss percentage to date: -7.07%  Nutrition Plan: Keeping a food journal and adhering to recommended goals of 1300-1450 calories and 85+ grams of protein daily for 90-95% of the time. Activity: None. Anti-obesity medications: Mounjaro 2.5 mg subcutaneously weekly. Reported side effects: None.  Interim History: Katherine Foster says she is averaging <1400 calories per day.  She reports that she did not have Mounjaro for 2 months, but has been back on it for a few weeks.  Assessment/Plan:   1. Insulin resistance, with polyphagia Not at goal. Goal is HgbA1c < 5.7, fasting insulin closer to 5.  Medication: Mounjaro 2.5 mg subcutaneously weekly.    Plan:  Continue Mounjaro 2.5 mg subcutaneously weekly.  Okay to increase to 5 mg if patient asks.  She will continue to focus on protein-rich, low simple carbohydrate foods. We reviewed the importance of hydration, regular exercise for stress reduction, and restorative sleep.   Lab Results  Component Value Date   HGBA1C 5.4 08/31/2021   Lab Results  Component Value Date   INSULIN 24.6 08/31/2021   - Refill tirzepatide (MOUNJARO) 2.5 MG/0.5ML Pen; Inject 2.5 mg into the skin once a week.  Dispense: 2 mL; Refill: 3  2. Vitamin D deficiency Not at goal. She was supplemented x1 month.  Plan: Restart 3 month supplementation.  Recheck lab at next visit.  Lab Results  Component Value Date   VD25OH 18.7 (L) 08/31/2021   VD25OH 51 05/16/2021   VD25OH 13 (L) 01/31/2021   - Restart Vitamin D, Ergocalciferol, (DRISDOL) 1.25 MG (50000 UNIT) CAPS capsule; Take  1 capsule (50,000 Units total) by mouth every 7 (seven) days.  Dispense: 12 capsule; Refill: 0  3. Mixed hyperlipidemia Course: Not at goal. Lipid-lowering medications: None.  Likely genetic component.   Plan:  Recheck lab at next visit.  Discussed Lipid Clinic.    Lab Results  Component Value Date   CHOL 193 08/22/2021   HDL 36 (L) 08/22/2021   LDLCALC 118 (H) 08/22/2021   TRIG 270 (H) 08/22/2021   CHOLHDL 5.4 (H) 08/22/2021   Lab Results  Component Value Date   ALT 20 08/31/2021   AST 14 08/31/2021   ALKPHOS 87 08/31/2021   BILITOT 0.3 08/31/2021   The 10-year ASCVD risk score (Arnett DK, et al., 2019) is: 2.2%   Values used to calculate the score:     Age: 46 years     Sex: Female     Is Non-Hispanic African American: No     Diabetic: No     Tobacco smoker: No     Systolic Blood Pressure: 062 mmHg     Is BP treated: Yes     HDL Cholesterol: 36 mg/dL     Total Cholesterol: 193 mg/dL  4. Essential hypertension At goal. Medications: Cozaar 100 mg daily.   Plan: Avoid buying foods that are: processed, frozen, or prepackaged to avoid excess salt. We will watch for signs of hypotension as she continues lifestyle modifications.  BP Readings from Last 3 Encounters:  11/29/21 127/75  11/13/21 109/70  10/03/21 121/67   Lab Results  Component Value Date  CREATININE 0.88 08/31/2021   5. Obesity with current BMI of 29.4  Course: Katherine Foster is currently in the action stage of change. As such, her goal is to continue with weight loss efforts.   Nutrition goals: She has agreed to keeping a food journal and adhering to recommended goals of 1300 calories and 85 grams of protein.   Exercise goals: All adults should avoid inactivity. Some physical activity is better than none, and adults who participate in any amount of physical activity gain some health benefits.  Behavioral modification strategies: increasing lean protein intake, decreasing simple carbohydrates, increasing  vegetables, increasing water intake, and decreasing liquid calories.  Katherine Foster has agreed to follow-up with our clinic in 4 weeks. She was informed of the importance of frequent follow-up visits to maximize her success with intensive lifestyle modifications for her multiple health conditions.   Objective:   Blood pressure 127/75, pulse 68, temperature 98 F (36.7 C), temperature source Oral, height 5\' 4"  (1.626 m), weight 171 lb (77.6 kg), SpO2 98 %. Body mass index is 29.35 kg/m.  General: Cooperative, alert, well developed, in no acute distress. HEENT: Conjunctivae and lids unremarkable. Cardiovascular: Regular rhythm.  Lungs: Normal work of breathing. Neurologic: No focal deficits.   Lab Results  Component Value Date   CREATININE 0.88 08/31/2021   BUN 11 08/31/2021   NA 139 08/31/2021   K 4.9 08/31/2021   CL 101 08/31/2021   CO2 23 08/31/2021   Lab Results  Component Value Date   ALT 20 08/31/2021   AST 14 08/31/2021   ALKPHOS 87 08/31/2021   BILITOT 0.3 08/31/2021   Lab Results  Component Value Date   HGBA1C 5.4 08/31/2021   HGBA1C 5.6 01/31/2021   Lab Results  Component Value Date   INSULIN 24.6 08/31/2021   Lab Results  Component Value Date   TSH 2.00 01/31/2021   Lab Results  Component Value Date   CHOL 193 08/22/2021   HDL 36 (L) 08/22/2021   LDLCALC 118 (H) 08/22/2021   TRIG 270 (H) 08/22/2021   CHOLHDL 5.4 (H) 08/22/2021   Lab Results  Component Value Date   VD25OH 18.7 (L) 08/31/2021   VD25OH 51 05/16/2021   VD25OH 13 (L) 01/31/2021   Lab Results  Component Value Date   WBC 12.4 (H) 03/15/2021   HGB 13.1 03/15/2021   HCT 38.6 03/15/2021   MCV 86.0 03/15/2021   PLT 284 03/15/2021   Attestation Statements:   Reviewed by clinician on day of visit: allergies, medications, problem list, medical history, surgical history, family history, social history, and previous encounter notes.  I, Water quality scientist, CMA, am acting as transcriptionist for  Briscoe Deutscher, DO  I have reviewed the above documentation for accuracy and completeness, and I agree with the above. -  Briscoe Deutscher, DO, MS, FAAFP, DABOM - Family and Bariatric Medicine.

## 2022-01-03 ENCOUNTER — Encounter (INDEPENDENT_AMBULATORY_CARE_PROVIDER_SITE_OTHER): Payer: Self-pay

## 2022-01-03 ENCOUNTER — Ambulatory Visit (INDEPENDENT_AMBULATORY_CARE_PROVIDER_SITE_OTHER): Payer: BC Managed Care – PPO | Admitting: Family Medicine

## 2022-02-15 ENCOUNTER — Encounter (INDEPENDENT_AMBULATORY_CARE_PROVIDER_SITE_OTHER): Payer: Self-pay | Admitting: Adult Health

## 2022-02-15 ENCOUNTER — Ambulatory Visit (INDEPENDENT_AMBULATORY_CARE_PROVIDER_SITE_OTHER): Payer: BC Managed Care – PPO | Admitting: Adult Health

## 2022-02-15 VITALS — BP 119/75 | HR 70 | Temp 98.3°F | Ht 64.0 in | Wt 166.2 lb

## 2022-02-15 DIAGNOSIS — Z9189 Other specified personal risk factors, not elsewhere classified: Secondary | ICD-10-CM

## 2022-02-15 DIAGNOSIS — E8881 Metabolic syndrome: Secondary | ICD-10-CM | POA: Diagnosis not present

## 2022-02-15 DIAGNOSIS — I10 Essential (primary) hypertension: Secondary | ICD-10-CM

## 2022-02-15 DIAGNOSIS — E669 Obesity, unspecified: Secondary | ICD-10-CM

## 2022-02-15 DIAGNOSIS — Z6828 Body mass index (BMI) 28.0-28.9, adult: Secondary | ICD-10-CM

## 2022-02-15 DIAGNOSIS — E559 Vitamin D deficiency, unspecified: Secondary | ICD-10-CM

## 2022-02-15 MED ORDER — WEGOVY 0.25 MG/0.5ML ~~LOC~~ SOAJ: 0.2500 mg | SUBCUTANEOUS | 0 refills | Status: DC

## 2022-02-15 MED ORDER — VITAMIN D (ERGOCALCIFEROL) 1.25 MG (50000 UNIT) PO CAPS: 50000.0000 [IU] | ORAL_CAPSULE | ORAL | 0 refills | Status: DC

## 2022-02-15 MED ORDER — TIRZEPATIDE 2.5 MG/0.5ML ~~LOC~~ SOAJ: 2.5000 mg | SUBCUTANEOUS | 3 refills | Status: DC

## 2022-02-16 LAB — COMPREHENSIVE METABOLIC PANEL
ALT: 23 IU/L (ref 0–32)
AST: 15 IU/L (ref 0–40)
Albumin/Globulin Ratio: 1.9 (ref 1.2–2.2)
Albumin: 4.5 g/dL (ref 3.8–4.8)
Alkaline Phosphatase: 71 IU/L (ref 44–121)
BUN/Creatinine Ratio: 10 (ref 9–23)
BUN: 9 mg/dL (ref 6–24)
Bilirubin Total: 0.4 mg/dL (ref 0.0–1.2)
CO2: 23 mmol/L (ref 20–29)
Calcium: 9.5 mg/dL (ref 8.7–10.2)
Chloride: 103 mmol/L (ref 96–106)
Creatinine, Ser: 0.9 mg/dL (ref 0.57–1.00)
Globulin, Total: 2.4 g/dL (ref 1.5–4.5)
Glucose: 82 mg/dL (ref 70–99)
Potassium: 4.6 mmol/L (ref 3.5–5.2)
Sodium: 140 mmol/L (ref 134–144)
Total Protein: 6.9 g/dL (ref 6.0–8.5)
eGFR: 79 mL/min/{1.73_m2} (ref >59)

## 2022-02-16 LAB — HEMOGLOBIN A1C
Est. average glucose Bld gHb Est-mCnc: 114 mg/dL
Hgb A1c MFr Bld: 5.6 % (ref 4.8–5.6)

## 2022-02-16 LAB — INSULIN, RANDOM: INSULIN: 16.6 u[IU]/mL (ref 2.6–24.9)

## 2022-02-16 LAB — VITAMIN D 25 HYDROXY (VIT D DEFICIENCY, FRACTURES): Vit D, 25-Hydroxy: 34.9 ng/mL (ref 30.0–100.0)

## 2022-02-19 ENCOUNTER — Telehealth (INDEPENDENT_AMBULATORY_CARE_PROVIDER_SITE_OTHER): Payer: Self-pay | Admitting: Adult Health

## 2022-02-19 ENCOUNTER — Encounter (INDEPENDENT_AMBULATORY_CARE_PROVIDER_SITE_OTHER): Payer: Self-pay

## 2022-02-19 NOTE — Telephone Encounter (Signed)
noted 

## 2022-02-19 NOTE — Telephone Encounter (Signed)
Prior authorization approved for [facility]. Effective: 02/15/2022 through 06/20/2022. Patient sent approval message via mychart.  ?

## 2022-02-25 ENCOUNTER — Other Ambulatory Visit: Payer: Self-pay | Admitting: Internal Medicine

## 2022-02-25 DIAGNOSIS — I10 Essential (primary) hypertension: Secondary | ICD-10-CM

## 2022-02-26 ENCOUNTER — Other Ambulatory Visit: Payer: Self-pay | Admitting: Internal Medicine

## 2022-02-26 DIAGNOSIS — I10 Essential (primary) hypertension: Secondary | ICD-10-CM

## 2022-02-26 NOTE — Telephone Encounter (Signed)
Courtesy refill. Patient will need an office visit for further refills. ?Requested Prescriptions  ?Pending Prescriptions Disp Refills  ?? losartan (COZAAR) 100 MG tablet [Pharmacy Med Name: LOSARTAN '100MG'$  TABLETS] 30 tablet 0  ?  Sig: TAKE 1 TABLET(100 MG) BY MOUTH DAILY  ?  ? Cardiovascular:  Angiotensin Receptor Blockers Failed - 02/25/2022 10:36 AM  ?  ?  Failed - Valid encounter within last 6 months  ?  Recent Outpatient Visits   ?      ? 6 months ago Mixed hyperlipidemia  ? [facility] [place], [place] W, NP  ? 7 months ago Sebaceous cyst of labia  ? [place], NP  ? 1 year ago Routine general medical examination at a health care facility  ? [facility] Kathrine Haddock, NP  ? 1 year ago Abscess  ? [place], FNP  ? 1 year ago Hypertension, unspecified type  ? [place], FNP  ?  ?  ? ?  ?  ?  Passed - Cr in normal range and within 180 days  ?  Creat  ?Date Value Ref Range Status  ?01/31/2021 0.87 0.50 - 1.10 mg/dL Final  ? ?Creatinine, Ser  ?Date Value Ref Range Status  ?02/15/2022 0.90 0.57 - 1.00 mg/dL Final  ?   ?  ?  Passed - K in normal range and within 180 days  ?  Potassium  ?Date Value Ref Range Status  ?02/15/2022 4.6 3.5 - 5.2 mmol/L Final  ?   ?  ?  Passed - Patient is not pregnant  ?  ?  Passed - Last BP in normal range  ?  BP Readings from Last 1 Encounters:  ?02/15/22 119/75  ?   ?  ?  ? ?

## 2022-02-26 NOTE — Telephone Encounter (Signed)
Called pt - LMOM requesting return call to make an appointment. ?

## 2022-02-27 NOTE — Telephone Encounter (Signed)
Requested medication (s) are due for refill today:   Yes  See pt notes about not needing to see Rollene Fare again until 07/2022.  Given a 30 day supply on 02/26/2022 0 refills. ? ?Requested medication (s) are on the active medication list:   Yes ? ?Future visit scheduled:   No    Seen 6 months ago ? ? ?Last ordered: 02/26/2022 #30, 0 refills ? ?Returned because she is requesting a 90 day supply.  See above note.    ? ?Requested Prescriptions  ?Pending Prescriptions Disp Refills  ? losartan (COZAAR) 100 MG tablet [Pharmacy Med Name: LOSARTAN '100MG'$  TABLETS] 90 tablet   ?  Sig: TAKE 1 TABLET(100 MG) BY MOUTH DAILY  ?  ? Cardiovascular:  Angiotensin Receptor Blockers Failed - 02/26/2022  5:20 PM  ?  ?  Failed - Valid encounter within last 6 months  ?  Recent Outpatient Visits   ? ?      ? 6 months ago Mixed hyperlipidemia  ? [facility] [place], [place] W, NP  ? 7 months ago Sebaceous cyst of labia  ? [place], NP  ? 1 year ago Routine general medical examination at a health care facility  ? [facility] Kathrine Haddock, NP  ? 1 year ago Abscess  ? [place], FNP  ? 1 year ago Hypertension, unspecified type  ? [place], FNP  ? ?  ?  ? ? ?  ?  ?  Passed - Cr in normal range and within 180 days  ?  Creat  ?Date Value Ref Range Status  ?01/31/2021 0.87 0.50 - 1.10 mg/dL Final  ? ?Creatinine, Ser  ?Date Value Ref Range Status  ?02/15/2022 0.90 0.57 - 1.00 mg/dL Final  ?  ?  ?  ?  Passed - K in normal range and within 180 days  ?  Potassium  ?Date Value Ref Range Status  ?02/15/2022 4.6 3.5 - 5.2 mmol/L Final  ?  ?  ?  ?  Passed - Patient is not pregnant  ?  ?  Passed - Last BP in normal range  ?  BP Readings from Last 1 Encounters:  ?02/15/22 119/75  ?  ?  ?  ?  ? ?

## 2022-02-27 NOTE — Telephone Encounter (Signed)
Pt given a 30 day refill per protocol. Received message from pt stating that OV not needed for refillsvper provider. ?Routing to provider to provide additional refills as requested from pt. ?

## 2022-02-28 NOTE — Progress Notes (Signed)
? ? ? ?Chief Complaint:  ? ?OBESITY ?Aerie is here to discuss her progress with her obesity treatment plan along with follow-up of her obesity related diagnoses. Anderson is on the journaling 1300 calories 85 grams of protein daily and states she is following her eating plan approximately 50% of the time. Lunette states she is not exercising. ? ?Today's visit was #: 10 ?Starting weight: 184 lbs ?Starting date: 04/18/2021 ?Today's weight: 166 lbs ?Today's date: 02/15/2022 ?Total lbs lost to date: 77 ?Total lbs lost since last in-office visit: 5 ? ?Interim History:  ?Melenda states she has been off of Mounjaro 2.5 mg for 3 weeks as she ran out of her prescription. ?She endorses notably increase in polyphagia since stopping the GIP/GLP-1 therapy. ? ?Subjective:  ? ?1. Insulin resistance, with polyphagia ?Bertie states she has been off of Mounjaro 2.5 mg for 3 weeks as she ran out of her prescription. ?She endorses notably increase in polyphagia since stopping the GIP/GLP-1 therapy. ?She denies family hx of MTC or personal hx of pancreatitis. ? ?2. Hypertension ?Blood pressure and heart rate are excellent in the office today.   ?Minie is on Losartan 100 mg daily.   ?She denies any acute cardiac symptoms. ? ?3. Vitamin D Deficiency ?Krysti is on Ergocalciferol 50,000 units once week- she denies any nausea, vomiting or muscle weakness. ? ?4. At risk for nausea ?Evelin is at risk for nausea due to [facility] and obesity.  ? ?Assessment/Plan:  ? ?1. Insulin resistance, with polyphagia ?We will check labs today. Monseratt is to remain off [place], and start Wegovy.  ? ?- Hemoglobin A1c ?- Insulin, random ? ?2. Vitamin D deficiency ?We will refill her Vitamin D today and obtain Vitamin D level ? ?- Vitamin D, Ergocalciferol, (DRISDOL) 1.25 MG (50000 UNIT) CAPS capsule; Take 1 capsule (50,000 Units total) by mouth every 7 (seven) days.  Dispense: 12 capsule; Refill: 0 ?- VITAMIN D 25 Hydroxy (Vit-D Deficiency, Fractures) ? ?3.  Essential hypertension ?We will check labs today.  ? ?- Comprehensive metabolic panel ? ?4. At risk for nausea ?Marivel Mcclarty was given approximately 15 minutes of nausea prevention counseling today. Nishi is at risk for nausea due to her new or current medication. She was encouraged to titrate her medication slowly, make sure to stay hydrated, eat smaller portions throughout the day, and avoid high fat meals.  ? ?5. Obesity with current BMI of 28.5 ?Cosima is currently in the action stage of change. As such, her goal is to continue with weight loss efforts. She has agreed to adhere to her recommended goals of 1300 calories and 85 grams of protein.  ? ?Claritza agreed to start Wegovy 0.25 mg once weekly Disp 52mg RF 0 ? ?Follow up with her PCP-HLD ? ?Exercise goals: She agrees to some exercise ? ?Behavioral modification strategies: increasing lean protein intake, decreasing simple carbohydrates, keeping healthy foods in the home, ways to avoid boredom eating, and planning for success. ? ?LKarsynhas agreed to follow-up with our clinic in 5 weeks. She was informed of the importance of frequent follow-up visits to maximize her success with intensive lifestyle modifications for her multiple health conditions.  ? ?LEllagracewas informed we would discuss her lab results at her next visit unless there is a critical issue that needs to be addressed sooner. LDanneagreed to keep her next visit at the agreed upon time to discuss these results. ? ?Objective:  ? ?Blood pressure 119/75, pulse 70, temperature 98.3 ?F (36.8 ?C), height  $'5\' 4"'S$  (1.626 m), weight 166 lb 3.2 oz (75.4 kg), SpO2 100 %. ?Body mass index is 28.53 kg/m?. ? ?General: Cooperative, alert, well developed, in no acute distress. ?HEENT: Conjunctivae and lids unremarkable. ?Cardiovascular: Regular rhythm.  ?Lungs: Normal work of breathing. ?Neurologic: No focal deficits.  ? ?Lab Results  ?Component Value Date  ? CREATININE 0.90 02/15/2022  ? BUN 9 02/15/2022  ? NA 140  02/15/2022  ? K 4.6 02/15/2022  ? CL 103 02/15/2022  ? CO2 23 02/15/2022  ? ?Lab Results  ?Component Value Date  ? ALT 23 02/15/2022  ? AST 15 02/15/2022  ? ALKPHOS 71 02/15/2022  ? BILITOT 0.4 02/15/2022  ? ?Lab Results  ?Component Value Date  ? HGBA1C 5.6 02/15/2022  ? HGBA1C 5.4 08/31/2021  ? HGBA1C 5.6 01/31/2021  ? ?Lab Results  ?Component Value Date  ? INSULIN 16.6 02/15/2022  ? INSULIN 24.6 08/31/2021  ? ?Lab Results  ?Component Value Date  ? TSH 2.00 01/31/2021  ? ?Lab Results  ?Component Value Date  ? CHOL 193 08/22/2021  ? HDL 36 (L) 08/22/2021  ? LDLCALC 118 (H) 08/22/2021  ? TRIG 270 (H) 08/22/2021  ? CHOLHDL 5.4 (H) 08/22/2021  ? ?Lab Results  ?Component Value Date  ? VD25OH 34.9 02/15/2022  ? VD25OH 18.7 (L) 08/31/2021  ? VD25OH 51 05/16/2021  ? ?Lab Results  ?Component Value Date  ? WBC 12.4 (H) 03/15/2021  ? HGB 13.1 03/15/2021  ? HCT 38.6 03/15/2021  ? MCV 86.0 03/15/2021  ? PLT 284 03/15/2021  ? ?No results found for: IRON, TIBC, FERRITIN ? ? ?Attestation Statements:  ? ?Reviewed by clinician on day of visit: allergies, medications, problem list, medical history, surgical history, family history, social history, and previous encounter notes. ? ?I, Althea Charon, am acting as Location manager for Mina Marble, NP. ? ?I have reviewed the above documentation for accuracy and completeness, and I agree with the above. -  Doniesha Landau d. Nivedita Mirabella, NP-C  ?

## 2022-03-21 ENCOUNTER — Encounter (INDEPENDENT_AMBULATORY_CARE_PROVIDER_SITE_OTHER): Payer: Self-pay | Admitting: Adult Health

## 2022-03-22 ENCOUNTER — Encounter (INDEPENDENT_AMBULATORY_CARE_PROVIDER_SITE_OTHER): Payer: Self-pay | Admitting: Adult Health

## 2022-03-22 NOTE — Telephone Encounter (Signed)
LAST APPOINTMENT DATE: 02/15/22 NEXT APPOINTMENT DATE: 03/29/22   [id number] [address], [place] AT [facility] OF [address] [place] [address] Phone: [phone] Fax: [phone]  [facility] [place], [place] [address] Phone: [phone] Fax: [phone]  Patient is requesting a refill of the following medications: No prescriptions requested or ordered in this encounter   Date last filled: 02/15/22 Previously prescribed by [facility]  Lab Results      Component                Value               Date                      HGBA1C                   5.6                 02/15/2022                HGBA1C                   5.4                 08/31/2021                HGBA1C                   5.6                 01/31/2021           Lab Results      Component                Value               Date                      LDLCALC                  118 (H)             08/22/2021                CREATININE               0.90                02/15/2022           Lab Results      Component                Value               Date                      VD25OH                   34.9                02/15/2022                VD25OH                   18.7 (L)            08/31/2021  VD25OH                   51                  05/16/2021            BP Readings from Last 3 Encounters: 02/15/22 : 119/75 11/29/21 : 127/75 11/13/21 : 109/70

## 2022-03-27 ENCOUNTER — Other Ambulatory Visit: Payer: Self-pay | Admitting: Internal Medicine

## 2022-03-27 ENCOUNTER — Other Ambulatory Visit (INDEPENDENT_AMBULATORY_CARE_PROVIDER_SITE_OTHER): Payer: Self-pay | Admitting: Adult Health

## 2022-03-27 DIAGNOSIS — R454 Irritability and anger: Secondary | ICD-10-CM

## 2022-03-27 MED ORDER — WEGOVY 0.5 MG/0.5ML ~~LOC~~ SOAJ: 0.5000 mg | SUBCUTANEOUS | 0 refills | Status: DC

## 2022-03-28 ENCOUNTER — Other Ambulatory Visit: Payer: Self-pay | Admitting: Internal Medicine

## 2022-03-28 DIAGNOSIS — Z1231 Encounter for screening mammogram for malignant neoplasm of breast: Secondary | ICD-10-CM

## 2022-03-28 NOTE — Telephone Encounter (Signed)
Called and left vm to call back to schedule OV. Refilled medication.  Requested Prescriptions  Pending Prescriptions Disp Refills  . citalopram (CELEXA) 20 MG tablet [Pharmacy Med Name: CITALOPRAM '20MG'$  TABLETS] 90 tablet 1    Sig: TAKE 1 TABLET(20 MG) BY MOUTH DAILY     Psychiatry:  Antidepressants - SSRI Failed - 03/27/2022  7:02 AM      Failed - Valid encounter within last 6 months    Recent Outpatient Visits          7 months ago Mixed hyperlipidemia   [place], NP   8 months ago Sebaceous cyst of labia   [facility] [place], Coralie Keens, NP   1 year ago Routine general medical examination at a health care facility   [place], NP   1 year ago [place], FNP   2 years ago Hypertension, unspecified type   [facility], Lupita Raider, [place]

## 2022-03-29 ENCOUNTER — Ambulatory Visit (INDEPENDENT_AMBULATORY_CARE_PROVIDER_SITE_OTHER): Payer: BC Managed Care – PPO | Admitting: Adult Health

## 2022-03-29 ENCOUNTER — Encounter (INDEPENDENT_AMBULATORY_CARE_PROVIDER_SITE_OTHER): Payer: Self-pay | Admitting: Adult Health

## 2022-03-29 VITALS — BP 99/64 | HR 68 | Temp 98.3°F | Ht 64.0 in | Wt 165.0 lb

## 2022-03-29 DIAGNOSIS — E669 Obesity, unspecified: Secondary | ICD-10-CM | POA: Diagnosis not present

## 2022-03-29 DIAGNOSIS — E8881 Metabolic syndrome: Secondary | ICD-10-CM

## 2022-03-29 DIAGNOSIS — E782 Mixed hyperlipidemia: Secondary | ICD-10-CM

## 2022-03-29 DIAGNOSIS — Z6828 Body mass index (BMI) 28.0-28.9, adult: Secondary | ICD-10-CM

## 2022-04-04 NOTE — Progress Notes (Signed)
Chief Complaint:   OBESITY Katherine Foster is here to discuss her progress with her obesity treatment plan along with follow-up of her obesity related diagnoses. Gussie is on keeping a food journal and adhering to recommended goals of 1300 calories and 85 gms protein and states she is following her eating plan approximately 50% of the time. Zakyah states she is not currently exercising.  Today's visit was #: 11 Starting weight: 184 lbs Starting date: 04/18/2021 Today's weight: 165 Today's date: 03/29/22 Total lbs lost to date: 19 lbs Total lbs lost since last in-office visit: -1  Interim History:  She is not journaling intake, just estimating intake.   She will eat out every day at lunch-Mexican/Greek chicken. Skips breakfast.   Supper-meat sandwich, only 2 slices, does not use food scale.    03/21/22-Wegovy was increased to 0.5 mg.  Subjective:   1. Mixed hyperlipidemia Lipid Panel     Component Value Date/Time   CHOL 193 08/22/2021 0901   TRIG 270 (H) 08/22/2021 0901   HDL 36 (L) 08/22/2021 0901   CHOLHDL 5.4 (H) 08/22/2021 0901   LDLCALC 118 (H) 08/22/2021 0901  She has never been on statin therapy.  2. Insulin resistance, with polyphagia 02/15/22- Started on Wegovy 0.'25mg'$  03/21/22-Wegovy was increased to 0.5 mg. She denies mass in neck, dysphagia, dyspepsia, persistent hoarseness, abd pain, or N/V/Constipation.  Assessment/Plan:   1. Mixed hyperlipidemia Follow-up with PCP with uncontrolled hyperlipidemia.  2. Insulin resistance, with polyphagia Remain on [facility] therapy.  3. Obesity with current BMI of 28.4 Gianny is currently in the action stage of change. As such, her goal is to continue with weight loss efforts. She has agreed to keeping a food journal and adhering to recommended goals of 1300 calories and 85 gms protein.   Refill  Wegovy 0.5 mg once week, Disp 77m, RF 0  Exercise goals: All adults should avoid inactivity. Some physical activity is better than  none, and adults who participate in any amount of physical activity gain some health benefits.  Behavioral modification strategies: increasing lean protein intake, decreasing simple carbohydrates, meal planning and cooking strategies, keeping healthy foods in the home, and planning for success.  LAggiehas agreed to follow-up with our clinic in 4-5 weeks. She was informed of the importance of frequent follow-up visits to maximize her success with intensive lifestyle modifications for her multiple health conditions.    Objective:   Blood pressure 99/64, pulse 68, temperature 98.3 F (36.8 C), height '5\' 4"'$  (1.626 m), weight 165 lb (74.8 kg), SpO2 99 %. Body mass index is 28.32 kg/m.  General: Cooperative, alert, well developed, in no acute distress. HEENT: Conjunctivae and lids unremarkable. Cardiovascular: Regular rhythm.  Lungs: Normal work of breathing. Neurologic: No focal deficits.   Lab Results  Component Value Date   CREATININE 0.90 02/15/2022   BUN 9 02/15/2022   NA 140 02/15/2022   K 4.6 02/15/2022   CL 103 02/15/2022   CO2 23 02/15/2022   Lab Results  Component Value Date   ALT 23 02/15/2022   AST 15 02/15/2022   ALKPHOS 71 02/15/2022   BILITOT 0.4 02/15/2022   Lab Results  Component Value Date   HGBA1C 5.6 02/15/2022   HGBA1C 5.4 08/31/2021   HGBA1C 5.6 01/31/2021   Lab Results  Component Value Date   INSULIN 16.6 02/15/2022   INSULIN 24.6 08/31/2021   Lab Results  Component Value Date   TSH 2.00 01/31/2021   Lab Results  Component Value  Date   CHOL 193 08/22/2021   HDL 36 (L) 08/22/2021   LDLCALC 118 (H) 08/22/2021   TRIG 270 (H) 08/22/2021   CHOLHDL 5.4 (H) 08/22/2021   Lab Results  Component Value Date   VD25OH 34.9 02/15/2022   VD25OH 18.7 (L) 08/31/2021   VD25OH 51 05/16/2021   Lab Results  Component Value Date   WBC 12.4 (H) 03/15/2021   HGB 13.1 03/15/2021   HCT 38.6 03/15/2021   MCV 86.0 03/15/2021   PLT 284 03/15/2021   No  results found for: IRON, TIBC, FERRITIN  Attestation Statements:   Reviewed by clinician on day of visit: allergies, medications, problem list, medical history, surgical history, family history, social history, and previous encounter notes.  I, Georgianne Fick, FNP, am acting as Location manager for Mina Marble, NP.  I have reviewed the above documentation for accuracy and completeness, and I agree with the above. -  Liliya Fullenwider d. Travez Stancil, NP-C

## 2022-04-25 ENCOUNTER — Ambulatory Visit
Admission: RE | Admit: 2022-04-25 | Discharge: 2022-04-25 | Disposition: A | Discharge status: Home / Self Care | Payer: BC Managed Care – PPO | Source: Ambulatory Visit | Attending: Internal Medicine | Admitting: Internal Medicine

## 2022-04-25 DIAGNOSIS — Z1231 Encounter for screening mammogram for malignant neoplasm of breast: Secondary | ICD-10-CM | POA: Diagnosis not present

## 2022-05-08 ENCOUNTER — Ambulatory Visit (INDEPENDENT_AMBULATORY_CARE_PROVIDER_SITE_OTHER): Payer: BC Managed Care – PPO | Admitting: Adult Health

## 2022-05-08 ENCOUNTER — Encounter (INDEPENDENT_AMBULATORY_CARE_PROVIDER_SITE_OTHER): Payer: Self-pay | Admitting: Adult Health

## 2022-05-08 VITALS — BP 114/74 | HR 65 | Temp 98.2°F | Ht 64.0 in | Wt 169.0 lb

## 2022-05-08 DIAGNOSIS — Z9189 Other specified personal risk factors, not elsewhere classified: Secondary | ICD-10-CM

## 2022-05-08 DIAGNOSIS — E669 Obesity, unspecified: Secondary | ICD-10-CM | POA: Diagnosis not present

## 2022-05-08 DIAGNOSIS — E782 Mixed hyperlipidemia: Secondary | ICD-10-CM | POA: Diagnosis not present

## 2022-05-08 DIAGNOSIS — R632 Polyphagia: Secondary | ICD-10-CM | POA: Diagnosis not present

## 2022-05-08 DIAGNOSIS — E559 Vitamin D deficiency, unspecified: Secondary | ICD-10-CM

## 2022-05-08 DIAGNOSIS — Z6829 Body mass index (BMI) 29.0-29.9, adult: Secondary | ICD-10-CM

## 2022-05-08 MED ORDER — WEGOVY 0.25 MG/0.5ML ~~LOC~~ SOAJ: 0.2500 mg | SUBCUTANEOUS | 0 refills | Status: DC

## 2022-05-08 NOTE — Progress Notes (Signed)
Chief Complaint:   OBESITY Katherine Foster is here to discuss her progress with her obesity treatment plan along with follow-up of her obesity related diagnoses. Katherine Foster is on keeping a food journal and adhering to recommended goals of 1300 calories and 85 grams of protein daily and states she is following her eating plan approximately 50% of the time. Katherine Foster states she is doing 0 minutes 0 times per week.  Today's visit was #: 12 Starting weight: 184 lbs Starting date: 04/18/2021 Today's weight: 169 lbs Today's date: 05/08/2022 Total lbs lost to date: 15 Total lbs lost since last in-office visit: 0  Interim History:  Katherine Foster reports eating out every day for lunch during her work week: [place], [place], [employer], [employer].   Saturdays and Sundays she eats out twice a day.    Mounjaro 2.5 mg was replaced with Wegovy 0.25 mg on 02/15/2022 Due to national drug shortage her last dose of GLP-1 therapy was in mid May 2023.  Only 1 month on Wegovy 0.25 mg, unable to fill Wegovy 0.5 mg.  She denies family hx of MENS 2 or MTC.  She denies personal hx of pancreatitis.  Subjective:   1. Vitamin D deficiency Katherine Foster's last vitamin D level was34.9 on 02/15/22. She is on  ergocalciferol 50,000 units once weekly. She denies N/V/Muscle Weakness.  2. Mixed hyperlipidemia Lipid Panel     Component Value Date/Time   CHOL 193 08/22/2021 0901   TRIG 270 (H) 08/22/2021 0901   HDL 36 (L) 08/22/2021 0901   CHOLHDL 5.4 (H) 08/22/2021 0901   LDLCALC 118 (H) 08/22/2021 0901    She was strongly advised that she has hyperlipidemia, and she is not statin therapy.  3. Overeating Katherine Foster eats out up to 9 times per week.   She prefers not to meal plan or prep.   She lives with her 61 year old mother, and her 21 year old daughter.  4. At risk for heart disease Katherine Foster is at higher than average risk for cardiovascular disease due to hyperlipidemia and obesity.  Assessment/Plan:   1. Vitamin D  deficiency Flossy will continue ergocalciferol 50,000 units once weekly, and we will refill for 1 month.  2. Mixed hyperlipidemia Kyelee will continue to follow-up with her PCP in regards to uncontrolled cholesterol.  3. Overeating Taelynn is to have a family meeting with her mother and daughter, re: grocery shopping, keeping healthier foods in home, and meal planning and prepping together. Patient was referred to Dr. Dewaine Conger, our Bariatric Psychologist, for evaluation due to her significant struggles with emotional eating.  4. At risk for heart disease Katherine Foster was given approximately 15 minutes of coronary artery disease prevention counseling today. She is 46 y.o. female and has risk factors for heart disease including obesity. We discussed intensive lifestyle modifications today with an emphasis on specific weight loss instructions and strategies.  Repetitive spaced learning was employed today to elicit superior memory formation and behavioral change.   5. Obesity with current BMI of 29.1 Katherine Foster is currently in the action stage of change. As such, her goal is to continue with weight loss efforts. She has agreed to keeping a food journal and adhering to recommended goals of 1300 calories and 85 grams of protein daily.   We discussed various medication options to help Katherine Foster with her weight loss efforts and we both agreed to restart Wegovy 0.25 mg once weekly, with no refills.  - Semaglutide-Weight Management (WEGOVY) 0.25 MG/0.5ML SOAJ; Inject 0.25 mg into the skin once  a week.  Dispense: 2 mL; Refill: 0  MyFitness Pal to track intake.  Reduce eating out by 25% each week.  Exercise goals: Increase daily walking.  Behavioral modification strategies: increasing lean protein intake, decreasing simple carbohydrates, meal planning and cooking strategies, keeping healthy foods in the home, and planning for success.  Benji has agreed to follow-up with our clinic in 4 weeks. She was informed of  the importance of frequent follow-up visits to maximize her success with intensive lifestyle modifications for her multiple health conditions.   Objective:   Blood pressure 114/74, pulse 65, temperature 98.2 F (36.8 C), height 5\' 4"  (1.626 m), weight 169 lb (76.7 kg), last menstrual period 04/17/2022, SpO2 98 %. Body mass index is 29.01 kg/m.  General: Cooperative, alert, well developed, in no acute distress. HEENT: Conjunctivae and lids unremarkable. Cardiovascular: Regular rhythm.  Lungs: Normal work of breathing. Neurologic: No focal deficits.   Lab Results  Component Value Date   CREATININE 0.90 02/15/2022   BUN 9 02/15/2022   NA 140 02/15/2022   K 4.6 02/15/2022   CL 103 02/15/2022   CO2 23 02/15/2022   Lab Results  Component Value Date   ALT 23 02/15/2022   AST 15 02/15/2022   ALKPHOS 71 02/15/2022   BILITOT 0.4 02/15/2022   Lab Results  Component Value Date   HGBA1C 5.6 02/15/2022   HGBA1C 5.4 08/31/2021   HGBA1C 5.6 01/31/2021   Lab Results  Component Value Date   INSULIN 16.6 02/15/2022   INSULIN 24.6 08/31/2021   Lab Results  Component Value Date   TSH 2.00 01/31/2021   Lab Results  Component Value Date   CHOL 193 08/22/2021   HDL 36 (L) 08/22/2021   LDLCALC 118 (H) 08/22/2021   TRIG 270 (H) 08/22/2021   CHOLHDL 5.4 (H) 08/22/2021   Lab Results  Component Value Date   VD25OH 34.9 02/15/2022   VD25OH 18.7 (L) 08/31/2021   VD25OH 51 05/16/2021   Lab Results  Component Value Date   WBC 12.4 (H) 03/15/2021   HGB 13.1 03/15/2021   HCT 38.6 03/15/2021   MCV 86.0 03/15/2021   PLT 284 03/15/2021   No results found for: "IRON", "TIBC", "FERRITIN"  Attestation Statements:   Reviewed by clinician on day of visit: allergies, medications, problem list, medical history, surgical history, family history, social history, and previous encounter notes.   Trude Mcburney, am acting as transcriptionist for William Hamburger, NP.  I have reviewed the  above documentation for accuracy and completeness, and I agree with the above. -  Mafalda Mcginniss d. Evadne Ose, NP-C
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
## 2022-05-09 DIAGNOSIS — R632 Polyphagia: Secondary | ICD-10-CM | POA: Insufficient documentation

## 2022-05-09 MED ORDER — VITAMIN D (ERGOCALCIFEROL) 1.25 MG (50000 UNIT) PO CAPS: 50000.0000 [IU] | ORAL_CAPSULE | ORAL | 0 refills | Status: DC

## 2022-05-15 ENCOUNTER — Telehealth (INDEPENDENT_AMBULATORY_CARE_PROVIDER_SITE_OTHER): Payer: BC Managed Care – PPO | Admitting: Psychology

## 2022-06-05 ENCOUNTER — Ambulatory Visit (INDEPENDENT_AMBULATORY_CARE_PROVIDER_SITE_OTHER): Payer: BC Managed Care – PPO | Admitting: Adult Health

## 2022-06-06 ENCOUNTER — Encounter (INDEPENDENT_AMBULATORY_CARE_PROVIDER_SITE_OTHER): Payer: Self-pay

## 2022-06-07 ENCOUNTER — Encounter: Payer: Self-pay | Admitting: Internal Medicine

## 2022-06-07 ENCOUNTER — Ambulatory Visit (INDEPENDENT_AMBULATORY_CARE_PROVIDER_SITE_OTHER): Payer: BC Managed Care – PPO | Admitting: Internal Medicine

## 2022-06-07 VITALS — BP 136/82 | HR 68 | Temp 97.3°F | Ht 64.0 in | Wt 179.0 lb

## 2022-06-07 DIAGNOSIS — E6609 Other obesity due to excess calories: Secondary | ICD-10-CM | POA: Diagnosis not present

## 2022-06-07 DIAGNOSIS — Z1159 Encounter for screening for other viral diseases: Secondary | ICD-10-CM | POA: Diagnosis not present

## 2022-06-07 DIAGNOSIS — E782 Mixed hyperlipidemia: Secondary | ICD-10-CM | POA: Diagnosis not present

## 2022-06-07 DIAGNOSIS — Z6830 Body mass index (BMI) 30.0-30.9, adult: Secondary | ICD-10-CM

## 2022-06-07 DIAGNOSIS — Z6831 Body mass index (BMI) 31.0-31.9, adult: Secondary | ICD-10-CM | POA: Diagnosis not present

## 2022-06-07 DIAGNOSIS — Z0001 Encounter for general adult medical examination with abnormal findings: Secondary | ICD-10-CM

## 2022-06-07 NOTE — Assessment & Plan Note (Signed)
Encourage diet and exercise for weight loss 

## 2022-06-07 NOTE — Patient Instructions (Signed)

## 2022-06-07 NOTE — Progress Notes (Signed)
Subjective:    Patient ID: Katherine Foster, female    DOB: [birth year], 46 y.o.   MRN: [id number]  HPI  Patient presents to clinic today for her annual exam.  Flu: 07/2021 Tetanus: 07/2018 COVID: Pfizer x 2 Pap smear: 01/2020 Mammogram: 03/2022 Colon screening: 06/2021 Vision screening: annually Dentist: biannually  Diet: She does eat meat. She consumes fruits and veggies. She does eat some fried foods. She drinks mostly tea and water. Exercise: None  Review of Systems     Past Medical History:  Diagnosis Date   Chronic cough    Constipation    Hyperlipidemia    Hypertension    Joint pain    Lower extremity edema    Palpitations    SOB (shortness of breath) on exertion     Current Outpatient Medications  Medication Sig Dispense Refill   citalopram (CELEXA) 20 MG tablet TAKE 1 TABLET(20 MG) BY MOUTH DAILY 90 tablet 1   gabapentin (NEURONTIN) 300 MG capsule Take 1 capsule (300 mg total) by mouth 2 (two) times daily as needed. 60 capsule 0   losartan (COZAAR) 100 MG tablet TAKE 1 TABLET(100 MG) BY MOUTH DAILY 90 tablet 1   rizatriptan (MAXALT) 5 MG tablet Take 5 mg by mouth as needed for migraine. May repeat in 2 hours if needed     Semaglutide-Weight Management (WEGOVY) 0.25 MG/0.5ML SOAJ Inject 0.25 mg into the skin once a week. 2 mL 0   Vitamin D, Ergocalciferol, (DRISDOL) 1.25 MG (50000 UNIT) CAPS capsule Take 1 capsule (50,000 Units total) by mouth every 7 (seven) days. 4 capsule 0   No current facility-administered medications for this visit.    Allergies  Allergen Reactions   Sulfa Antibiotics     Family History  Problem Relation Age of Onset   Heart disease Mother    Hypertension Mother    Diabetes Mother    Diabetes Mellitus II Mother    Hypothyroidism Mother    Depression Mother    Anxiety disorder Mother    Liver disease Mother    Obesity Mother    Obesity Father    Thyroid disease Father    Hyperlipidemia Father    Hypertension Father     Diabetes Father    Heart disease Father    Heart attack Father    Diabetes Mellitus II Father    Sudden death Father    Breast cancer Maternal Grandmother    Cancer Maternal Grandmother    Diabetes Maternal Grandfather    Heart attack Paternal Grandfather     Social History   Socioeconomic History   Marital status: Married    Spouse name: Not on file   Number of children: Not on file   Years of education: Not on file   Highest education level: Not on file  Occupational History   Occupation: Industrial/product designer  Tobacco Use   Smoking status: Never   Smokeless tobacco: Never  Vaping Use   Vaping Use: Never used  Substance and Sexual Activity   Alcohol use: Yes    Comment: occ   Drug use: Never   Sexual activity: Not on file  Other Topics Concern   Not on file  Social History Narrative   Not on file   Social Determinants of Health   Financial Resource Strain: Not on file  Food Insecurity: Not on file  Transportation Needs: Not on file  Physical Activity: Not on file  Stress: Not on file  Social Connections:  Not on file  Intimate Partner Violence: Not on file     Constitutional: Patient reports intermittent headaches.  Denies fever, malaise, fatigue, or abrupt weight changes.  HEENT: Denies eye pain, eye redness, ear pain, ringing in the ears, wax buildup, runny nose, nasal congestion, bloody nose, or sore throat. Respiratory: Denies difficulty breathing, shortness of breath, cough or sputum production.   Cardiovascular: Denies chest pain, chest tightness, palpitations or swelling in the hands or feet.  Gastrointestinal: Denies abdominal pain, bloating, constipation, diarrhea or blood in the stool.  GU: Denies urgency, frequency, pain with urination, burning sensation, blood in urine, odor or discharge. Musculoskeletal: Patient reports intermittent joint pain.  Denies decrease in range of motion, difficulty with gait, muscle pain or  joint swelling.  Skin: Denies redness, rashes, lesions or ulcercations.  Neurological: Denies dizziness, difficulty with memory, difficulty with speech or problems with balance and coordination.  Psych: Patient has a history of anxiety.  Denies depression, SI/HI.  No other specific complaints in a complete review of systems (except as listed in HPI above).  Objective:   Physical Exam  BP 136/82 (BP Location: Left Arm, Patient Position: Sitting, Cuff Size: Large)   Pulse 68   Temp (!) 97.3 F (36.3 C) (Temporal)   Ht '5\' 4"'  (1.626 m)   Wt 179 lb (81.2 kg)   SpO2 99%   BMI 30.73 kg/m   Wt Readings from Last 3 Encounters:  05/08/22 169 lb (76.7 kg)  03/29/22 165 lb (74.8 kg)  02/15/22 166 lb 3.2 oz (75.4 kg)    General: Appears her stated age, obese, in NAD. Skin: Warm, dry and intact.  HEENT: Head: normal shape and size; Eyes: sclera white, no icterus, conjunctiva pink, PERRLA and EOMs intact;  Neck:  Neck supple, trachea midline. No masses, lumps or thyromegaly present.  Cardiovascular: Normal rate and rhythm. S1,S2 noted.  No murmur, rubs or gallops noted. No JVD or BLE edema.  Pulmonary/Chest: Normal effort and positive vesicular breath sounds. No respiratory distress. No wheezes, rales or ronchi noted.  Abdomen: Normal bowel sounds.  Musculoskeletal: Strength 5/5 BUE/BLE.  No difficulty with gait.  Neurological: Alert and oriented. Cranial nerves II-XII grossly intact. Coordination normal.  Psychiatric: Mood and affect normal. Behavior is normal. Judgment and thought content normal.     BMET    Component Value Date/Time   NA 140 02/15/2022 0951   K 4.6 02/15/2022 0951   CL 103 02/15/2022 0951   CO2 23 02/15/2022 0951   GLUCOSE 82 02/15/2022 0951   GLUCOSE 86 03/15/2021 1018   BUN 9 02/15/2022 0951   CREATININE 0.90 02/15/2022 0951   CREATININE 0.87 01/31/2021 1002   CALCIUM 9.5 02/15/2022 0951   GFRNONAA >60 03/15/2021 1018   GFRNONAA 84 12/29/2019 1031   GFRAA  97 12/29/2019 1031    Lipid Panel     Component Value Date/Time   CHOL 193 08/22/2021 0901   TRIG 270 (H) 08/22/2021 0901   HDL 36 (L) 08/22/2021 0901   CHOLHDL 5.4 (H) 08/22/2021 0901   LDLCALC 118 (H) 08/22/2021 0901    CBC    Component Value Date/Time   WBC 12.4 (H) 03/15/2021 1018   RBC 4.49 03/15/2021 1018   HGB 13.1 03/15/2021 1018   HCT 38.6 03/15/2021 1018   PLT 284 03/15/2021 1018   MCV 86.0 03/15/2021 1018   MCH 29.2 03/15/2021 1018   MCHC 33.9 03/15/2021 1018   RDW 12.4 03/15/2021 1018   LYMPHSABS 2,888 01/31/2021 1002  EOSABS 452 01/31/2021 1002   BASOSABS 84 01/31/2021 1002    Hgb A1C Lab Results  Component Value Date   HGBA1C 5.6 02/15/2022          Assessment & Plan:   Preventative Health Maintenance:  Encouraged her to get a flu shot in the fall Tetanus UTD Encouraged her to get a COVID vaccine Pap smear UTD Mammogram UTD Colon screening UTD Encouraged her to consume a balanced diet and exercise regimen Advised her to see an eye doctor and dentist annually Will check CBC, c-Met, lipid, A1c and hep C today  RTC in 6 months, follow-up chronic conditions Webb Silversmith, NP

## 2022-06-08 ENCOUNTER — Encounter: Payer: Self-pay | Admitting: Internal Medicine

## 2022-06-08 ENCOUNTER — Telehealth: Payer: Self-pay

## 2022-06-08 LAB — COMPLETE METABOLIC PANEL WITH GFR
AG Ratio: 1.4 (calc) (ref 1.0–2.5)
ALT: 15 U/L (ref 6–29)
AST: 12 U/L (ref 10–35)
Albumin: 3.8 g/dL (ref 3.6–5.1)
Alkaline phosphatase (APISO): 57 U/L (ref 31–125)
BUN: 12 mg/dL (ref 7–25)
CO2: 26 mmol/L (ref 20–32)
Calcium: 9.1 mg/dL (ref 8.6–10.2)
Chloride: 106 mmol/L (ref 98–110)
Creat: 0.91 mg/dL (ref 0.50–0.99)
Globulin: 2.7 g/dL (calc) (ref 1.9–3.7)
Glucose, Bld: 81 mg/dL (ref 65–99)
Potassium: 4.3 mmol/L (ref 3.5–5.3)
Sodium: 139 mmol/L (ref 135–146)
Total Bilirubin: 0.4 mg/dL (ref 0.2–1.2)
Total Protein: 6.5 g/dL (ref 6.1–8.1)
eGFR: 78 mL/min/{1.73_m2} (ref >=60)

## 2022-06-08 LAB — CBC
HCT: 37.2 % (ref 35.0–45.0)
Hemoglobin: 12.5 g/dL (ref 11.7–15.5)
MCH: 29.6 pg (ref 27.0–33.0)
MCHC: 33.6 g/dL (ref 32.0–36.0)
MCV: 87.9 fL (ref 80.0–100.0)
MPV: 10.1 fL (ref 7.5–12.5)
Platelets: 257 10*3/uL (ref 140–400)
RBC: 4.23 10*6/uL (ref 3.80–5.10)
RDW: 12.7 % (ref 11.0–15.0)
WBC: 7.9 10*3/uL (ref 3.8–10.8)

## 2022-06-08 LAB — LIPID PANEL
Cholesterol: 203 mg/dL — ABNORMAL HIGH (ref <200)
HDL: 36 mg/dL — ABNORMAL LOW (ref >=50)
LDL Cholesterol (Calc): 133 mg/dL (calc) — ABNORMAL HIGH
Non-HDL Cholesterol (Calc): 167 mg/dL (calc) — ABNORMAL HIGH (ref <130)
Total CHOL/HDL Ratio: 5.6 (calc) — ABNORMAL HIGH (ref <5.0)
Triglycerides: 203 mg/dL — ABNORMAL HIGH (ref <150)

## 2022-06-08 LAB — HEMOGLOBIN A1C
Hgb A1c MFr Bld: 5.4 % of total Hgb (ref <5.7)
Mean Plasma Glucose: 108 mg/dL
eAG (mmol/L): 6 mmol/L

## 2022-06-08 LAB — HEPATITIS C ANTIBODY: Hepatitis C Ab: NONREACTIVE

## 2022-06-08 MED ORDER — ATORVASTATIN CALCIUM 10 MG PO TABS: 10.0000 mg | ORAL_TABLET | Freq: Every day | ORAL | 1 refills | Status: DC

## 2022-06-08 NOTE — Telephone Encounter (Signed)
Patient left vm to schedule 1 year repeat colonoscopy. Requesting call back.

## 2022-06-11 ENCOUNTER — Telehealth: Payer: Self-pay

## 2022-06-11 ENCOUNTER — Telehealth: Payer: Self-pay | Admitting: Internal Medicine

## 2022-06-11 ENCOUNTER — Other Ambulatory Visit: Payer: Self-pay

## 2022-06-11 DIAGNOSIS — E559 Vitamin D deficiency, unspecified: Secondary | ICD-10-CM

## 2022-06-11 DIAGNOSIS — Z86010 Personal history of colonic polyps: Secondary | ICD-10-CM

## 2022-06-11 MED ORDER — NA SULFATE-K SULFATE-MG SULF 17.5-3.13-1.6 GM/177ML PO SOLN: 1.0000 | Freq: Once | ORAL | 0 refills | Status: AC

## 2022-06-11 NOTE — Telephone Encounter (Signed)
Pt. States she no longer goes to weight management and would like Ms. Beaty to refill for her.

## 2022-06-11 NOTE — Telephone Encounter (Signed)
Gastroenterology Pre-Procedure Review  Request Date: 07/18/22 Requesting Physician: Dr. Marius Ditch  PATIENT REVIEW QUESTIONS: The patient responded to the following health history questions as indicated:    1. Are you having any GI issues? no 2. Do you have a personal history of Polyps? yes (last colonoscopy 07/18/21 performed by Dr. Bonna Gains) 3. Do you have a family history of Colon Cancer or Polyps? yes (mother and grandfather colon polyps, grand mother colon cancer spread from another area) 4. Diabetes Mellitus? no 5. Joint replacements in the past 12 months?no 6. Major health problems in the past 3 months?no 7. Any artificial heart valves, MVP, or defibrillator?no    MEDICATIONS & ALLERGIES:    Patient reports the following regarding taking any anticoagulation/antiplatelet therapy:   Plavix, Coumadin, Eliquis, Xarelto, Lovenox, Pradaxa, Brilinta, or Effient? no Aspirin? no  Patient confirms/reports the following medications:  Current Outpatient Medications  Medication Sig Dispense Refill   Na Sulfate-K Sulfate-Mg Sulf 17.5-3.13-1.6 GM/177ML SOLN Take 1 kit by mouth once for 1 dose. 354 mL 0   atorvastatin (LIPITOR) 10 MG tablet Take 1 tablet (10 mg total) by mouth daily. 90 tablet 1   citalopram (CELEXA) 20 MG tablet TAKE 1 TABLET(20 MG) BY MOUTH DAILY 90 tablet 1   gabapentin (NEURONTIN) 300 MG capsule Take 1 capsule (300 mg total) by mouth 2 (two) times daily as needed. 60 capsule 0   losartan (COZAAR) 100 MG tablet TAKE 1 TABLET(100 MG) BY MOUTH DAILY 90 tablet 1   rizatriptan (MAXALT) 5 MG tablet Take 5 mg by mouth as needed for migraine. May repeat in 2 hours if needed     Vitamin D, Ergocalciferol, (DRISDOL) 1.25 MG (50000 UNIT) CAPS capsule Take 1 capsule (50,000 Units total) by mouth every 7 (seven) days. 4 capsule 0   No current facility-administered medications for this visit.    Patient confirms/reports the following allergies:  Allergies  Allergen Reactions   Sulfa  Antibiotics     No orders of the defined types were placed in this encounter.   AUTHORIZATION INFORMATION Primary Insurance: 1D#: Group #:  Secondary Insurance: 1D#: Group #:  SCHEDULE INFORMATION: Date: 07/18/22 Time: Location: [facility]

## 2022-06-11 NOTE — Telephone Encounter (Signed)
Patient called, left VM to return the call to the office. The refill was last sent in by a provider at [facility] and patient will need to contact that office for the refill, unless she is now wanting [facility] to refill it.

## 2022-06-11 NOTE — Telephone Encounter (Signed)
Pt called to report that she needs a 90 day supply per insurance for Vitamin D, Ergocalciferol, (DRISDOL) 1.25 MG (50000 UNIT) CAPS capsule   [facility] [place], [address]  [place] [address]  Phone: [phone] Fax: [phone]

## 2022-06-12 MED ORDER — VITAMIN D (ERGOCALCIFEROL) 1.25 MG (50000 UNIT) PO CAPS: 50000.0000 [IU] | ORAL_CAPSULE | ORAL | 0 refills | Status: DC

## 2022-06-12 NOTE — Telephone Encounter (Signed)
Refilled. Will plan to check Vit D at next visit.

## 2022-06-12 NOTE — Addendum Note (Signed)
Addended by: Jearld Fenton on: 06/12/2022 08:20 AM   Modules accepted: Orders

## 2022-07-18 ENCOUNTER — Encounter: Payer: Self-pay | Admitting: Gastroenterology

## 2022-07-18 ENCOUNTER — Ambulatory Visit (HOSPITAL_BASED_OUTPATIENT_CLINIC_OR_DEPARTMENT_OTHER)
Admission: RE | Admit: 2022-07-18 | Discharge: 2022-07-18 | Disposition: A | Discharge status: Home / Self Care | Payer: BC Managed Care – PPO | Source: Home / Self Care | Attending: Gastroenterology | Admitting: Gastroenterology

## 2022-07-18 ENCOUNTER — Ambulatory Visit: Payer: BC Managed Care – PPO | Admitting: Certified Registered"

## 2022-07-18 ENCOUNTER — Encounter
Admission: RE | Disposition: A | Discharge status: Home / Self Care | Payer: Self-pay | Source: Home / Self Care | Attending: Gastroenterology

## 2022-07-18 DIAGNOSIS — F419 Anxiety disorder, unspecified: Secondary | ICD-10-CM | POA: Insufficient documentation

## 2022-07-18 DIAGNOSIS — R519 Headache, unspecified: Secondary | ICD-10-CM | POA: Insufficient documentation

## 2022-07-18 DIAGNOSIS — E785 Hyperlipidemia, unspecified: Secondary | ICD-10-CM | POA: Diagnosis not present

## 2022-07-18 DIAGNOSIS — K9171 Accidental puncture and laceration of a digestive system organ or structure during a digestive system procedure: Secondary | ICD-10-CM | POA: Diagnosis not present

## 2022-07-18 DIAGNOSIS — K668 Other specified disorders of peritoneum: Secondary | ICD-10-CM | POA: Diagnosis not present

## 2022-07-18 DIAGNOSIS — Z1211 Encounter for screening for malignant neoplasm of colon: Secondary | ICD-10-CM | POA: Insufficient documentation

## 2022-07-18 DIAGNOSIS — K3532 Acute appendicitis with perforation and localized peritonitis, without abscess: Secondary | ICD-10-CM | POA: Diagnosis not present

## 2022-07-18 DIAGNOSIS — I10 Essential (primary) hypertension: Secondary | ICD-10-CM | POA: Diagnosis not present

## 2022-07-18 DIAGNOSIS — Z833 Family history of diabetes mellitus: Secondary | ICD-10-CM | POA: Diagnosis not present

## 2022-07-18 DIAGNOSIS — K760 Fatty (change of) liver, not elsewhere classified: Secondary | ICD-10-CM | POA: Diagnosis not present

## 2022-07-18 DIAGNOSIS — Z86010 Personal history of colonic polyps: Secondary | ICD-10-CM

## 2022-07-18 DIAGNOSIS — D120 Benign neoplasm of cecum: Secondary | ICD-10-CM | POA: Insufficient documentation

## 2022-07-18 DIAGNOSIS — N179 Acute kidney failure, unspecified: Secondary | ICD-10-CM | POA: Diagnosis not present

## 2022-07-18 DIAGNOSIS — Z882 Allergy status to sulfonamides status: Secondary | ICD-10-CM | POA: Diagnosis not present

## 2022-07-18 DIAGNOSIS — M1990 Unspecified osteoarthritis, unspecified site: Secondary | ICD-10-CM | POA: Insufficient documentation

## 2022-07-18 DIAGNOSIS — Z8719 Personal history of other diseases of the digestive system: Secondary | ICD-10-CM | POA: Diagnosis not present

## 2022-07-18 DIAGNOSIS — D122 Benign neoplasm of ascending colon: Secondary | ICD-10-CM | POA: Insufficient documentation

## 2022-07-18 DIAGNOSIS — Z8349 Family history of other endocrine, nutritional and metabolic diseases: Secondary | ICD-10-CM | POA: Diagnosis not present

## 2022-07-18 DIAGNOSIS — E876 Hypokalemia: Secondary | ICD-10-CM | POA: Diagnosis not present

## 2022-07-18 DIAGNOSIS — K658 Other peritonitis: Secondary | ICD-10-CM | POA: Diagnosis not present

## 2022-07-18 DIAGNOSIS — Z79899 Other long term (current) drug therapy: Secondary | ICD-10-CM | POA: Diagnosis not present

## 2022-07-18 DIAGNOSIS — Z83438 Family history of other disorder of lipoprotein metabolism and other lipidemia: Secondary | ICD-10-CM | POA: Diagnosis not present

## 2022-07-18 DIAGNOSIS — Z8249 Family history of ischemic heart disease and other diseases of the circulatory system: Secondary | ICD-10-CM | POA: Diagnosis not present

## 2022-07-18 DIAGNOSIS — F411 Generalized anxiety disorder: Secondary | ICD-10-CM | POA: Diagnosis not present

## 2022-07-18 DIAGNOSIS — K635 Polyp of colon: Secondary | ICD-10-CM

## 2022-07-18 DIAGNOSIS — K631 Perforation of intestine (nontraumatic): Secondary | ICD-10-CM | POA: Diagnosis not present

## 2022-07-18 HISTORY — PX: COLONOSCOPY WITH PROPOFOL: SHX5780

## 2022-07-18 LAB — POCT PREGNANCY, URINE: Preg Test, Ur: NEGATIVE

## 2022-07-18 SURGERY — COLONOSCOPY WITH PROPOFOL
Anesthesia: Monitor Anesthesia Care

## 2022-07-18 MED ORDER — SODIUM CHLORIDE 0.9 % IV SOLN: INTRAVENOUS | Status: DC

## 2022-07-18 MED ORDER — PROPOFOL 1000 MG/100ML IV EMUL
INTRAVENOUS | Status: AC
  Filled 2022-07-18: qty 100

## 2022-07-18 MED ORDER — STERILE WATER FOR IRRIGATION IR SOLN
Status: DC | PRN
  Administered 2022-07-18 (×2): 60 mL

## 2022-07-18 MED ORDER — PROPOFOL 500 MG/50ML IV EMUL
INTRAVENOUS | Status: DC | PRN
  Administered 2022-07-18: 125 ug/kg/min via INTRAVENOUS

## 2022-07-18 MED ORDER — PROPOFOL 10 MG/ML IV BOLUS
INTRAVENOUS | Status: DC | PRN
  Administered 2022-07-18: 30 mg via INTRAVENOUS
  Administered 2022-07-18: 60 mg via INTRAVENOUS

## 2022-07-18 MED ORDER — LIDOCAINE HCL (CARDIAC) PF 100 MG/5ML IV SOSY
PREFILLED_SYRINGE | INTRAVENOUS | Status: DC | PRN
  Administered 2022-07-18: 40 mg via INTRAVENOUS

## 2022-07-18 MED ORDER — PROPOFOL 10 MG/ML IV BOLUS
INTRAVENOUS | Status: AC
  Filled 2022-07-18: qty 20

## 2022-07-18 NOTE — Transfer of Care (Signed)
Immediate Anesthesia Transfer of Care Note  Patient: Katherine Foster  Procedure(s) Performed: COLONOSCOPY WITH PROPOFOL  Patient Location: PACU  Anesthesia Type:General  Level of Consciousness: awake, alert  and oriented  Airway & Oxygen Therapy: Patient Spontanous Breathing  Post-op Assessment: Report given to RN and Post -op Vital signs reviewed and stable  Post vital signs: Reviewed and stable  Last Vitals:  Vitals Value Taken Time  BP 126/68 07/18/22 0810  Temp    Pulse 79 07/18/22 0809  Resp 16 07/18/22 0809  SpO2 100 % 07/18/22 0809  Vitals shown include unvalidated device data.  Last Pain:  Vitals:   07/18/22 0705  TempSrc: Temporal  PainSc: 0-No pain         Complications: No notable events documented.

## 2022-07-18 NOTE — H&P (Signed)
Cephas Darby, MD [address]  [place]  [place], [place] [address]  Main: [phone]  Fax: [phone] Pager: [phone]  Primary Care Physician:  Jearld Fenton, NP Primary Gastroenterologist:  Dr. Cephas Darby  Pre-Procedure History & Physical: HPI:  Katherine Foster is a 46 y.o. female is here for an colonoscopy.   Past Medical History:  Diagnosis Date   Chronic cough    Constipation    Hyperlipidemia    Hypertension    Joint pain    Lower extremity edema    Palpitations    SOB (shortness of breath) on exertion     Past Surgical History:  Procedure Laterality Date   BREAST BIOPSY Right 04/05/2020   [place] bx, coil marker, BENIGN MAMMARY PARENCHYMA   COLONOSCOPY WITH PROPOFOL N/A 07/18/2021   Procedure: COLONOSCOPY WITH PROPOFOL;  Surgeon: Virgel Manifold, MD;  Location: [facility] ENDOSCOPY;  Service: Endoscopy;  Laterality: N/A;   LASIK  11/06/2019   WISDOM TOOTH EXTRACTION      Prior to Admission medications   Medication Sig Start Date End Date Taking? Authorizing Provider  atorvastatin (LIPITOR) 10 MG tablet Take 1 tablet (10 mg total) by mouth daily. 06/08/22  Yes Baity, Coralie Keens, NP  citalopram (CELEXA) 20 MG tablet TAKE 1 TABLET(20 MG) BY MOUTH DAILY 03/28/22  Yes Baity, Coralie Keens, NP  losartan (COZAAR) 100 MG tablet TAKE 1 TABLET(100 MG) BY MOUTH DAILY 02/27/22  Yes Baity, Coralie Keens, NP  Vitamin D, Ergocalciferol, (DRISDOL) 1.25 MG (50000 UNIT) CAPS capsule Take 1 capsule (50,000 Units total) by mouth every 7 (seven) days. 06/12/22  Yes Baity, Coralie Keens, NP  gabapentin (NEURONTIN) 300 MG capsule Take 1 capsule (300 mg total) by mouth 2 (two) times daily as needed. 12/04/21   Jearld Fenton, NP  rizatriptan (MAXALT) 5 MG tablet Take 5 mg by mouth as needed for migraine. May repeat in 2 hours if needed    [provider]    Allergies as of 06/11/2022 - Review Complete 06/11/2022  Allergen Reaction Noted   Sulfa antibiotics  07/14/2015    Family  History  Problem Relation Age of Onset   Heart disease Mother    Hypertension Mother    Diabetes Mellitus II Mother    Hypothyroidism Mother    Anxiety disorder Mother    Liver disease Mother    Obesity Mother    Obesity Father    Thyroid disease Father    Hyperlipidemia Father    Hypertension Father    Heart disease Father    Heart attack Father    Diabetes Mellitus II Father    Sudden death Father    Breast cancer Maternal Grandmother    Cancer Maternal Grandmother    Diabetes Maternal Grandfather    Heart attack Paternal Grandfather    Colon cancer Neg Hx     Social History   Socioeconomic History   Marital status: Married    Spouse name: Not on file   Number of children: Not on file   Years of education: Not on file   Highest education level: Not on file  Occupational History   Occupation: Industrial/product designer  Tobacco Use   Smoking status: Never   Smokeless tobacco: Never  Vaping Use   Vaping Use: Never used  Substance and Sexual Activity   Alcohol use: Yes    Comment: occ   Drug use: Never   Sexual activity: Not on file  Other Topics Concern  Not on file  Social History Narrative   Not on file   Social Determinants of Health   Financial Resource Strain: Not on file  Food Insecurity: Not on file  Transportation Needs: Not on file  Physical Activity: Not on file  Stress: Not on file  Social Connections: Not on file  Intimate Partner Violence: Not on file    Review of Systems: See HPI, otherwise negative ROS  Physical Exam: BP (!) 142/100   Pulse 93   Temp 97.8 F (36.6 C) (Temporal)   Resp 16   Ht '5\' 4"'$  (1.626 m)   Wt 80.7 kg   SpO2 97%   BMI 30.55 kg/m  General:   Alert,  pleasant and cooperative in NAD Head:  Normocephalic and atraumatic. Neck:  Supple; no masses or thyromegaly. Lungs:  Clear throughout to auscultation.    Heart:  Regular rate and rhythm. Abdomen:  Soft, nontender and nondistended.  Normal bowel sounds, without guarding, and without rebound.   Neurologic:  Alert and  oriented x4;  grossly normal neurologically.  Impression/Plan: Katherine Foster is here for an colonoscopy to be performed for h/o colon adenomas  Risks, benefits, limitations, and alternatives regarding  colonoscopy have been reviewed with the patient.  Questions have been answered.  All parties agreeable.   Sherri Sear, MD  07/18/2022, 7:39 AM

## 2022-07-18 NOTE — Anesthesia Preprocedure Evaluation (Signed)
Anesthesia Evaluation  Patient identified by MRN, date of birth, ID band Patient awake    Reviewed: Allergy & Precautions, NPO status , Patient's Chart, lab work & pertinent test results  History of Anesthesia Complications Negative for: history of anesthetic complications  Airway Mallampati: II  TM Distance: >3 FB Neck ROM: Full    Dental no notable dental hx.    Pulmonary neg pulmonary ROS,    Pulmonary exam normal breath sounds clear to auscultation       Cardiovascular Exercise Tolerance: Good METS: 3 - Mets hypertension, negative cardio ROS Normal cardiovascular exam Rhythm:Regular Rate:Normal     Neuro/Psych  Headaches, PSYCHIATRIC DISORDERS Anxiety    GI/Hepatic negative GI ROS, Neg liver ROS,   Endo/Other  negative endocrine ROS  Renal/GU negative Renal ROS  negative genitourinary   Musculoskeletal  (+) Arthritis ,   Abdominal   Peds  Hematology negative hematology ROS (+)   Anesthesia Other Findings   Reproductive/Obstetrics negative OB ROS                            Anesthesia Physical Anesthesia Plan  ASA: 2  Anesthesia Plan: MAC   Post-op Pain Management:    Induction: Intravenous  PONV Risk Score and Plan:   Airway Management Planned: Natural Airway and Nasal Cannula  Additional Equipment:   Intra-op Plan:   Post-operative Plan:   Informed Consent: I have reviewed the patients History and Physical, chart, labs and discussed the procedure including the risks, benefits and alternatives for the proposed anesthesia with the patient or authorized representative who has indicated his/her understanding and acceptance.     Dental Advisory Given  Plan Discussed with: Anesthesiologist, CRNA and Surgeon  Anesthesia Plan Comments: (Patient consented for risks of anesthesia including but not limited to:  - adverse reactions to medications - damage to eyes, teeth,  lips or other oral mucosa - nerve damage due to positioning  - sore throat or hoarseness - Damage to heart, brain, nerves, lungs, other parts of body or loss of life  Patient voiced understanding.)        Anesthesia Quick Evaluation

## 2022-07-18 NOTE — Anesthesia Postprocedure Evaluation (Signed)
Anesthesia Post Note  Patient: Katherine Foster  Procedure(s) Performed: COLONOSCOPY WITH PROPOFOL  Patient location during evaluation: PACU Anesthesia Type: MAC Level of consciousness: awake and alert Pain management: pain level controlled Vital Signs Assessment: post-procedure vital signs reviewed and stable Respiratory status: spontaneous breathing, nonlabored ventilation, respiratory function stable and patient connected to nasal cannula oxygen Cardiovascular status: stable and blood pressure returned to baseline Postop Assessment: no apparent nausea or vomiting Anesthetic complications: no   No notable events documented.   Last Vitals:  Vitals:   07/18/22 0809 07/18/22 0819  BP:  120/86  Pulse:    Resp:    Temp: (!) 36.3 C   SpO2:      Last Pain:  Vitals:   07/18/22 0829  TempSrc:   PainSc: 0-No pain                 Tonny Bollman

## 2022-07-18 NOTE — Op Note (Signed)
[facility] Gastroenterology Patient Name: Katherine Foster Procedure Date: 07/18/2022 7:44 AM MRN: [id number] Account #: [id number] Date of Birth: 27-Sep-1976 Admit Type: Outpatient Age: 46 Room: [facility] ENDO ROOM 4 Gender: Female Note Status: Finalized Instrument Name: Jasper Riling [id number] Procedure:             Colonoscopy Indications:           Surveillance: History of piecemeal removal adenoma on                         last colonoscopy (< 3 yrs) Providers:             Lin Landsman MD, MD Referring MD:          Jearld Fenton (Referring MD) Medicines:             General Anesthesia Complications:         No immediate complications. Estimated blood loss: None. Procedure:             Pre-Anesthesia Assessment:                        - Prior to the procedure, a History and Physical was                         performed, and patient medications and allergies were                         reviewed. The patient is competent. The risks and                         benefits of the procedure and the sedation options and                         risks were discussed with the patient. All questions                         were answered and informed consent was obtained.                         Patient identification and proposed procedure were                         verified by the physician, the nurse, the                         anesthesiologist, the anesthetist and the technician                         in the pre-procedure area in the procedure room in the                         endoscopy suite. Mental Status Examination: alert and                         oriented. Airway Examination: normal oropharyngeal                         airway and neck mobility. Respiratory Examination:  clear to auscultation. CV Examination: normal.                         Prophylactic Antibiotics: The patient does not require                         prophylactic  antibiotics. Prior Anticoagulants: The                         patient has taken no previous anticoagulant or                         antiplatelet agents. ASA Grade Assessment: II - A                         patient with mild systemic disease. After reviewing                         the risks and benefits, the patient was deemed in                         satisfactory condition to undergo the procedure. The                         anesthesia plan was to use general anesthesia.                         Immediately prior to administration of medications,                         the patient was re-assessed for adequacy to receive                         sedatives. The heart rate, respiratory rate, oxygen                         saturations, blood pressure, adequacy of pulmonary                         ventilation, and response to care were monitored                         throughout the procedure. The physical status of the                         patient was re-assessed after the procedure.                        After obtaining informed consent, the colonoscope was                         passed under direct vision. Throughout the procedure,                         the patient's blood pressure, pulse, and oxygen                         saturations were monitored continuously. The  Colonoscope was introduced through the anus and                         advanced to the the terminal ileum, with                         identification of the appendiceal orifice and IC                         valve. The colonoscopy was performed without                         difficulty. The patient tolerated the procedure well.                         The quality of the bowel preparation was evaluated                         using the BBPS [facility] Bowel Preparation Scale) with                         scores of: Right Colon = 3, Transverse Colon = 3 and                         Left Colon =  3 (entire mucosa seen well with no                         residual staining, small fragments of stool or opaque                         liquid). The total BBPS score equals 9. Findings:      The perianal and digital rectal examinations were normal. Pertinent       negatives include normal sphincter tone and no palpable rectal lesions.      About 9 mm residual polyp at the previous polypectomy site was found in       the cecum, the residual polyp was found at the base of the hemostatic       clip that was placed after previous polypectomy from 2022. The polyp was       sessile. The polyp was removed with a hot snare. Resection and retrieval       were complete. Estimated blood loss: none.      A diminutive polyp was found in the ascending colon. The polyp was       sessile. The polyp was removed with a cold biopsy forceps. Resection and       retrieval were complete.      The retroflexed view of the distal rectum and anal verge was normal and       showed no anal or rectal abnormalities.      The exam was otherwise without abnormality.      The terminal ileum appeared normal. Impression:            - One 9 mm polyp in the cecum, removed with a hot                         snare. Resected and retrieved.                        -  One diminutive polyp in the ascending colon, removed                         with a cold biopsy forceps. Resected and retrieved.                        - The distal rectum and anal verge are normal on                         retroflexion view.                        - The examination was otherwise normal.                        - The examined portion of the ileum was normal. Recommendation:        - Discharge patient to home (with escort).                        - Resume previous diet today.                        - Continue present medications.                        - Await pathology results.                        - Repeat colonoscopy in 5 years for  surveillance. Procedure Code(s):     --- Professional ---                        [phone], Colonoscopy, flexible; with removal of                         tumor(s), polyp(s), or other lesion(s) by snare                         technique                        45380, 1, Colonoscopy, flexible; with biopsy, single                         or multiple Diagnosis Code(s):     --- Professional ---                        Z86.010, Personal history of colonic polyps                        K63.5, Polyp of colon CPT copyright 2019 American Medical Association. All rights reserved. The codes documented in this report are preliminary and upon coder review may  be revised to meet current compliance requirements. Dr. Ulyess Mort Lin Landsman MD, MD 07/18/2022 8:09:11 AM This report has been signed electronically. Number of Addenda: 0 Note Initiated On: 07/18/2022 7:44 AM Scope Withdrawal Time: 0 hours 13 minutes 31 seconds  Total Procedure Duration: 0 hours 16 minutes 55 seconds  Estimated Blood Loss:  Estimated blood loss: none.      [facility]

## 2022-07-19 ENCOUNTER — Other Ambulatory Visit: Payer: Self-pay

## 2022-07-19 ENCOUNTER — Inpatient Hospital Stay
Admission: EM | Admit: 2022-07-19 | Discharge: 2022-07-24 | DRG: 329 | Disposition: A | Discharge status: Home / Self Care | Payer: BC Managed Care – PPO | Attending: Surgery | Admitting: Surgery

## 2022-07-19 ENCOUNTER — Telehealth: Payer: Self-pay | Admitting: Gastroenterology

## 2022-07-19 ENCOUNTER — Encounter: Payer: Self-pay | Admitting: Gastroenterology

## 2022-07-19 ENCOUNTER — Emergency Department: Payer: BC Managed Care – PPO

## 2022-07-19 DIAGNOSIS — I10 Essential (primary) hypertension: Secondary | ICD-10-CM | POA: Diagnosis present

## 2022-07-19 DIAGNOSIS — K668 Other specified disorders of peritoneum: Secondary | ICD-10-CM | POA: Diagnosis present

## 2022-07-19 DIAGNOSIS — N179 Acute kidney failure, unspecified: Secondary | ICD-10-CM | POA: Diagnosis present

## 2022-07-19 DIAGNOSIS — Z882 Allergy status to sulfonamides status: Secondary | ICD-10-CM | POA: Diagnosis not present

## 2022-07-19 DIAGNOSIS — E785 Hyperlipidemia, unspecified: Secondary | ICD-10-CM | POA: Diagnosis present

## 2022-07-19 DIAGNOSIS — K631 Perforation of intestine (nontraumatic): Principal | ICD-10-CM | POA: Diagnosis present

## 2022-07-19 DIAGNOSIS — K9171 Accidental puncture and laceration of a digestive system organ or structure during a digestive system procedure: Secondary | ICD-10-CM | POA: Diagnosis present

## 2022-07-19 DIAGNOSIS — Z833 Family history of diabetes mellitus: Secondary | ICD-10-CM | POA: Diagnosis not present

## 2022-07-19 DIAGNOSIS — K658 Other peritonitis: Secondary | ICD-10-CM | POA: Diagnosis present

## 2022-07-19 DIAGNOSIS — Z79899 Other long term (current) drug therapy: Secondary | ICD-10-CM | POA: Diagnosis not present

## 2022-07-19 DIAGNOSIS — Z83438 Family history of other disorder of lipoprotein metabolism and other lipidemia: Secondary | ICD-10-CM | POA: Diagnosis not present

## 2022-07-19 DIAGNOSIS — F411 Generalized anxiety disorder: Secondary | ICD-10-CM | POA: Diagnosis present

## 2022-07-19 DIAGNOSIS — E876 Hypokalemia: Secondary | ICD-10-CM | POA: Diagnosis present

## 2022-07-19 DIAGNOSIS — Z8349 Family history of other endocrine, nutritional and metabolic diseases: Secondary | ICD-10-CM | POA: Diagnosis not present

## 2022-07-19 DIAGNOSIS — K635 Polyp of colon: Secondary | ICD-10-CM | POA: Diagnosis present

## 2022-07-19 DIAGNOSIS — Z8249 Family history of ischemic heart disease and other diseases of the circulatory system: Secondary | ICD-10-CM

## 2022-07-19 DIAGNOSIS — K3532 Acute appendicitis with perforation and localized peritonitis, without abscess: Secondary | ICD-10-CM | POA: Diagnosis not present

## 2022-07-19 LAB — URINALYSIS, ROUTINE W REFLEX MICROSCOPIC
Bilirubin Urine: NEGATIVE
Glucose, UA: NEGATIVE mg/dL
Hgb urine dipstick: NEGATIVE
Ketones, ur: NEGATIVE mg/dL
Nitrite: NEGATIVE
Protein, ur: 30 mg/dL — AB
Specific Gravity, Urine: 1.02 (ref 1.005–1.030)
pH: 5.5 (ref 5.0–8.0)

## 2022-07-19 LAB — LIPASE, BLOOD: Lipase: 32 U/L (ref 11–51)

## 2022-07-19 LAB — COMPREHENSIVE METABOLIC PANEL
ALT: 19 U/L (ref 0–44)
AST: 16 U/L (ref 15–41)
Albumin: 3.9 g/dL (ref 3.5–5.0)
Alkaline Phosphatase: 59 U/L (ref 38–126)
Anion gap: 9 (ref 5–15)
BUN: 10 mg/dL (ref 6–20)
CO2: 21 mmol/L — ABNORMAL LOW (ref 22–32)
Calcium: 9.3 mg/dL (ref 8.9–10.3)
Chloride: 107 mmol/L (ref 98–111)
Creatinine, Ser: 0.82 mg/dL (ref 0.44–1.00)
GFR, Estimated: >60 mL/min (ref >60)
Glucose, Bld: 102 mg/dL — ABNORMAL HIGH (ref 70–99)
Potassium: 3.3 mmol/L — ABNORMAL LOW (ref 3.5–5.1)
Sodium: 137 mmol/L (ref 135–145)
Total Bilirubin: 1.1 mg/dL (ref 0.3–1.2)
Total Protein: 7.4 g/dL (ref 6.5–8.1)

## 2022-07-19 LAB — CBC
HCT: 37.5 % (ref 36.0–46.0)
Hemoglobin: 12.5 g/dL (ref 12.0–15.0)
MCH: 29.1 pg (ref 26.0–34.0)
MCHC: 33.3 g/dL (ref 30.0–36.0)
MCV: 87.4 fL (ref 80.0–100.0)
Platelets: 247 10*3/uL (ref 150–400)
RBC: 4.29 MIL/uL (ref 3.87–5.11)
RDW: 12.8 % (ref 11.5–15.5)
WBC: 16.3 10*3/uL — ABNORMAL HIGH (ref 4.0–10.5)
nRBC: 0 % (ref 0.0–0.2)

## 2022-07-19 LAB — LACTIC ACID, PLASMA
Lactic Acid, Venous: 1.2 mmol/L (ref 0.5–1.9)
Lactic Acid, Venous: 1.2 mmol/L (ref 0.5–1.9)

## 2022-07-19 MED ORDER — MORPHINE SULFATE (PF) 4 MG/ML IV SOLN
4.0000 mg | Freq: Once | INTRAVENOUS | Status: AC
  Administered 2022-07-19: 4 mg via INTRAVENOUS
  Filled 2022-07-19: qty 1

## 2022-07-19 MED ORDER — LOSARTAN POTASSIUM 50 MG PO TABS
100.0000 mg | ORAL_TABLET | Freq: Every day | ORAL | Status: DC
  Administered 2022-07-20 – 2022-07-23 (×4): 100 mg via ORAL
  Filled 2022-07-19 (×4): qty 2

## 2022-07-19 MED ORDER — LACTATED RINGERS IV SOLN: INTRAVENOUS | Status: DC

## 2022-07-19 MED ORDER — CITALOPRAM HYDROBROMIDE 20 MG PO TABS
20.0000 mg | ORAL_TABLET | Freq: Every day | ORAL | Status: DC
  Administered 2022-07-20 – 2022-07-24 (×5): 20 mg via ORAL
  Filled 2022-07-19 (×5): qty 1

## 2022-07-19 MED ORDER — ENOXAPARIN SODIUM 40 MG/0.4ML IJ SOSY: 40.0000 mg | PREFILLED_SYRINGE | INTRAMUSCULAR | Status: DC

## 2022-07-19 MED ORDER — HYDROMORPHONE HCL 1 MG/ML IJ SOLN
0.5000 mg | INTRAMUSCULAR | Status: DC | PRN
  Filled 2022-07-19: qty 0.5

## 2022-07-19 MED ORDER — OXYCODONE HCL 5 MG PO TABS
5.0000 mg | ORAL_TABLET | ORAL | Status: DC | PRN
  Administered 2022-07-19 – 2022-07-21 (×5): 10 mg via ORAL
  Administered 2022-07-22 (×3): 5 mg via ORAL
  Administered 2022-07-22: 10 mg via ORAL
  Administered 2022-07-23 (×2): 5 mg via ORAL
  Administered 2022-07-24: 10 mg via ORAL
  Filled 2022-07-19: qty 2
  Filled 2022-07-19 (×2): qty 1
  Filled 2022-07-19 (×4): qty 2
  Filled 2022-07-19 (×2): qty 1
  Filled 2022-07-19: qty 2
  Filled 2022-07-19: qty 1
  Filled 2022-07-19: qty 2
  Filled 2022-07-19 (×2): qty 1

## 2022-07-19 MED ORDER — LACTATED RINGERS IV BOLUS
1000.0000 mL | Freq: Once | INTRAVENOUS | Status: AC
  Administered 2022-07-19: 1000 mL via INTRAVENOUS

## 2022-07-19 MED ORDER — IOHEXOL 300 MG/ML  SOLN
100.0000 mL | Freq: Once | INTRAMUSCULAR | Status: AC | PRN
  Administered 2022-07-19: 100 mL via INTRAVENOUS

## 2022-07-19 MED ORDER — ONDANSETRON HCL 4 MG/2ML IJ SOLN: 4.0000 mg | Freq: Four times a day (QID) | INTRAMUSCULAR | Status: DC | PRN

## 2022-07-19 MED ORDER — ONDANSETRON HCL 4 MG/2ML IJ SOLN
4.0000 mg | Freq: Once | INTRAMUSCULAR | Status: AC
  Administered 2022-07-19: 4 mg via INTRAVENOUS
  Filled 2022-07-19: qty 2

## 2022-07-19 MED ORDER — ONDANSETRON 4 MG PO TBDP: 4.0000 mg | ORAL_TABLET | Freq: Four times a day (QID) | ORAL | Status: DC | PRN

## 2022-07-19 MED ORDER — KETOROLAC TROMETHAMINE 30 MG/ML IJ SOLN
30.0000 mg | Freq: Four times a day (QID) | INTRAMUSCULAR | Status: DC
  Administered 2022-07-19 – 2022-07-23 (×14): 30 mg via INTRAVENOUS
  Filled 2022-07-19 (×13): qty 1

## 2022-07-19 MED ORDER — PANTOPRAZOLE SODIUM 40 MG IV SOLR
40.0000 mg | Freq: Every day | INTRAVENOUS | Status: DC
  Administered 2022-07-19 – 2022-07-23 (×5): 40 mg via INTRAVENOUS
  Filled 2022-07-19 (×5): qty 10

## 2022-07-19 MED ORDER — PIPERACILLIN-TAZOBACTAM 3.375 G IVPB 30 MIN
3.3750 g | Freq: Once | INTRAVENOUS | Status: AC
  Administered 2022-07-19: 3.375 g via INTRAVENOUS
  Filled 2022-07-19: qty 50

## 2022-07-19 MED ORDER — POLYETHYLENE GLYCOL 3350 17 G PO PACK
17.0000 g | PACK | Freq: Every day | ORAL | Status: DC | PRN
  Administered 2022-07-23: 17 g via ORAL
  Filled 2022-07-19: qty 1

## 2022-07-19 MED ORDER — PIPERACILLIN-TAZOBACTAM 3.375 G IVPB
3.3750 g | Freq: Three times a day (TID) | INTRAVENOUS | Status: DC
  Administered 2022-07-19 – 2022-07-24 (×13): 3.375 g via INTRAVENOUS
  Filled 2022-07-19 (×12): qty 50

## 2022-07-19 MED ORDER — ACETAMINOPHEN 500 MG PO TABS
1000.0000 mg | ORAL_TABLET | Freq: Four times a day (QID) | ORAL | Status: DC
  Administered 2022-07-19 – 2022-07-24 (×16): 1000 mg via ORAL
  Filled 2022-07-19 (×17): qty 2

## 2022-07-19 NOTE — Progress Notes (Signed)
Pharmacy Antibiotic Note  Katherine Foster is a 46 y.o. female admitted on 07/19/2022 with possible intra-abdominal infection.  Pharmacy has been consulted for Zosyn dosing.  Plan: start Zosyn 3.375g IV q8h (4 hour infusion).  Height: '5\' 4"'$  (162.6 cm) Weight: 80.7 kg (178 lb) IBW/kg (Calculated) : 54.7  Temp (24hrs), Avg:99.1 F (37.3 C), Min:99 F (37.2 C), Max:99.2 F (37.3 C)  Recent Labs  Lab 07/19/22 1448 07/19/22 1630  WBC 16.3*  --   CREATININE 0.82  --   LATICACIDVEN  --  1.2    Estimated Creatinine Clearance: 87.2 mL/min (by C-G formula based on SCr of 0.82 mg/dL).    Allergies  Allergen Reactions   Sulfa Antibiotics     Antimicrobials this admission: 09/21 Zosyn >>   Microbiology results: 09/21 BCx: pending  Thank you for allowing pharmacy to be a part of this patient's care.  Dallie Piles 07/19/2022 6:24 PM

## 2022-07-19 NOTE — ED Triage Notes (Signed)
Pt to ER with c/o lower abdominal pain that started last night after a colonoscopy yesterday.  Pt also endorses pelvic pain that feels like spasms.  Pt states mild low back pain.  Pt describes symptoms as " a really bad UTI".  No symptoms prior to colonoscopy yesterday.  Pt called GI who told her to come to ER for CT to check "the areas that were worked on yesterday."

## 2022-07-19 NOTE — Telephone Encounter (Signed)
Patient states that she was doing good after her procedure yesterday but around 11pm last night her stomach got hard and she was having severe abdominal pain. She states she only got 2 hours of sleep last night. Around 4am this morning she got really cold and took her temperature and it was 100.9. She states now she has a soreness all in her stomach and is having spasms in her pelvic area that is really painful every hour. She states she can hardly move with out it spasm up. She denies any blood in stools. Had 3 runny stools yesterday

## 2022-07-19 NOTE — Plan of Care (Signed)

## 2022-07-19 NOTE — ED Provider Notes (Signed)
[facility] Provider Note    Event Date/Time   First MD Initiated Contact with Patient 07/19/22 1455     (approximate)   History   Chief Complaint Abdominal Pain   HPI  Katherine Foster is a 46 y.o. female with past medical history of hypertension, migraines, and generalized anxiety disorder who presents to the ED complaining of abdominal pain.  Patient reports that she underwent colonoscopy yesterday with removal of 2 separate polyps, was feeling well following her procedure and ate twice with a normal bowel movement.  Overnight, she has developed significant pain in the bilateral lower quadrants of her abdomen.  She describes this as a dull ache with occasional severe crampy pain in the center of her lower abdomen.  She describes it as similar to a "severe UTI" but she denies any dysuria or hematuria.  She has had some pain in the middle of her lower back but denies any flank pain.  She does state that she had a fever up to 100.9 earlier today, took a dose of ibuprofen for pain a couple of hours ago.  She spoke with her gastroenterologist, who recommended she come to the ED for further evaluation.     Physical Exam   Triage Vital Signs: ED Triage Vitals  Enc Vitals Group     BP 07/19/22 1440 (!) 142/93     Pulse Rate 07/19/22 1440 (!) 110     Resp 07/19/22 1440 18     Temp 07/19/22 1440 99.2 F (37.3 C)     Temp Source 07/19/22 1440 Oral     SpO2 07/19/22 1440 90 %     Weight 07/19/22 1441 178 lb (80.7 kg)     Height 07/19/22 1441 '5\' 4"'$  (1.626 m)     Head Circumference --      Peak Flow --      Pain Score 07/19/22 1441 8     Pain Loc --      Pain Edu? --      Excl. in [place]? --     Most recent vital signs: Vitals:   07/19/22 1440  BP: (!) 142/93  Pulse: (!) 110  Resp: 18  Temp: 99.2 F (37.3 C)  SpO2: 90%    Constitutional: Alert and oriented. Eyes: Conjunctivae are normal. Head: Atraumatic. Nose: No congestion/rhinnorhea. Mouth/Throat:  Mucous membranes are moist.  Cardiovascular: Normal rate, regular rhythm. Grossly normal heart sounds.  2+ radial pulses bilaterally. Respiratory: Normal respiratory effort.  No retractions. Lungs CTAB. Gastrointestinal: Soft and diffusely tender to palpation, greatest in the bilateral lower quadrants, with no rebound or guarding. No distention. No CVA tenderness bilaterally. Musculoskeletal: No lower extremity tenderness nor edema.  Neurologic:  Normal speech and language. No gross focal neurologic deficits are appreciated.    ED Results / Procedures / Treatments   Labs (all labs ordered are listed, but only abnormal results are displayed) Labs Reviewed  COMPREHENSIVE METABOLIC PANEL - Abnormal; Notable for the following components:      Result Value   Potassium 3.3 (*)    CO2 21 (*)    Glucose, Bld 102 (*)    All other components within normal limits  CBC - Abnormal; Notable for the following components:   WBC 16.3 (*)    All other components within normal limits  CULTURE, BLOOD (ROUTINE X 2)  CULTURE, BLOOD (ROUTINE X 2)  LIPASE, BLOOD  URINALYSIS, ROUTINE W REFLEX MICROSCOPIC  LACTIC ACID, PLASMA  LACTIC ACID, PLASMA  RADIOLOGY CT abdomen/pelvis reviewed and interpreted by me with scattered free air as well as free fluid in the pelvis, inflammatory changes noted in the right lower quadrant.  PROCEDURES:  Critical Care performed: Yes, see critical care procedure note(s)  .Critical Care  Performed by: Blake Divine, MD Authorized by: Blake Divine, MD   Critical care provider statement:    Critical care time (minutes):  30   Critical care time was exclusive of:  Separately billable procedures and treating other patients and teaching time   Critical care was necessary to treat or prevent imminent or life-threatening deterioration of the following conditions:  Sepsis   Critical care was time spent personally by me on the following activities:  Development of  treatment plan with patient or surrogate, discussions with consultants, evaluation of patient's response to treatment, examination of patient, ordering and review of laboratory studies, ordering and review of radiographic studies, ordering and performing treatments and interventions, pulse oximetry, re-evaluation of patient's condition and review of old [place] ED: Medications  piperacillin-tazobactam (ZOSYN) IVPB 3.375 g (3.375 g Intravenous New Bag/Given 07/19/22 1637)  lactated ringers bolus 1,000 mL (has no administration in time range)  morphine (PF) 4 MG/ML injection 4 mg (4 mg Intravenous Given 07/19/22 1537)  ondansetron (ZOFRAN) injection 4 mg (4 mg Intravenous Given 07/19/22 1537)  lactated ringers bolus 1,000 mL (1,000 mLs Intravenous New Bag/Given 07/19/22 1537)  iohexol (OMNIPAQUE) 300 MG/ML solution 100 mL (100 mLs Intravenous Contrast Given 07/19/22 1541)     IMPRESSION / MDM / [place] / ED COURSE  I reviewed the triage vital signs and the nursing notes.                              46 y.o. female with past medical history of hypertension, migraines, and generalized anxiety disorder who presents to the ED complaining of increasing bilateral lower quadrant abdominal and pelvic pain since undergoing colonoscopy with polyp removal yesterday.  Patient's presentation is most consistent with acute presentation with potential threat to life or bodily function.  Differential diagnosis includes, but is not limited to, bowel perforation, diverticulitis, appendicitis, UTI, kidney stone, colitis, cholecystitis, bowel obstruction.  Patient nontoxic-appearing and in no acute distress, vital signs remarkable for tachycardia but otherwise reassuring.  Patient does report fever of 100.9 at home and took ibuprofen prior to arrival.  Labs thus far remarkable for leukocytosis but otherwise reassuring with no significant anemia, electrolyte abnormality, or AKI.   LFTs and lipase are unremarkable.  Case discussed with Dr. Marius Ditch of GI, who agrees with plan to proceed with CT imaging of her abdomen/pelvis to ensure no microperforation.  She also reports possibility of post polypectomy burn of the colon contributing to patient's symptoms, which would require treatment with antibiotics.  Patient does report significant pelvic pain similar to prior UTI, urinalysis is pending.  We will treat symptomatically with IV morphine and Zofran, hydrate with IV fluids and reassess.  CT of abdomen/pelvis shows scattered free air with intra-abdominal free fluid in the area of the cecum concerning for perforation.  Patient started on IV Zosyn and case discussed with Dr. Hampton Abbot of general surgery, who will evaluate the patient.  Dr. Marius Ditch of GI updated on CT results, GI team will follow during admission.      FINAL CLINICAL IMPRESSION(S) / ED DIAGNOSES   Final diagnoses:  Bowel perforation ([place])     Rx /  DC Orders   ED Discharge Orders     None        Note:  This document was prepared using Dragon voice recognition software and may include unintentional dictation errors.   Blake Divine, MD 07/19/22 [phone]

## 2022-07-19 NOTE — Consult Note (Signed)
CODE SEPSIS - PHARMACY COMMUNICATION  **Broad Spectrum Antibiotics should be administered within 1 hour of Sepsis diagnosis**  Time Code Sepsis Called/Page Received: 6440  Antibiotics Ordered: Zosyn  Time of 1st antibiotic administration: 3474  Additional action taken by pharmacy: none  If necessary, Name of Provider/Nurse Contacted: n/a    Pearla Dubonnet ,PharmD Clinical Pharmacist  07/19/2022  4:45 PM

## 2022-07-19 NOTE — ED Notes (Signed)
See triage note  States she had a colonoscopy yesterday    States developed low grade temp last pm with abd pain  Also having some pelvic pressure

## 2022-07-19 NOTE — Sepsis Progress Note (Signed)
Sepsis protocol monitored by eLink 

## 2022-07-19 NOTE — H&P (Signed)
Date of Admission:  07/19/2022  Reason for Admission:  Bowel perforation  History of Present Illness: Katherine Foster is a 46 y.o. female presenting with a 1 day history of lower abdominal pain.  The patient had a colonoscopy yesterday with Dr. Marius Ditch for a history of colonic polyps. Her last colonoscopy prior to that was in 2022 and she had a hemostatic clip placed at the location of the polypectomy in the cecum.  There was a residual polyp found on yesterday colonoscopy and this was resected with a hot snare.  The patient reports that during the day yesterday, she was doing well but last night she started having more lower abdominal soreness and some distention.  The pain continued to progress and this morning she also felt chills and had a low grade temp of 100.9.  Denies any nausea or vomiting.  She presented to the ED and her workup showed a WBC of 16.3, Cr of 0.82, K 3.3, normal LFTs, and normal lactic acid of 1.2.  CT scan however, showed a small amount of pneumoperitoneum in the RUQ and anterior abdominal wall, with some fluid around the cecum.  Past Medical History: Past Medical History:  Diagnosis Date   Chronic cough    Constipation    Hyperlipidemia    Hypertension    Joint pain    Lower extremity edema    Palpitations    SOB (shortness of breath) on exertion      Past Surgical History: Past Surgical History:  Procedure Laterality Date   BREAST BIOPSY Right 04/05/2020   [place] bx, coil marker, BENIGN MAMMARY PARENCHYMA   COLONOSCOPY WITH PROPOFOL N/A 07/18/2021   Procedure: COLONOSCOPY WITH PROPOFOL;  Surgeon: Virgel Manifold, MD;  Location: [facility] ENDOSCOPY;  Service: Endoscopy;  Laterality: N/A;   COLONOSCOPY WITH PROPOFOL N/A 07/18/2022   Procedure: COLONOSCOPY WITH PROPOFOL;  Surgeon: Lin Landsman, MD;  Location: [facility] ENDOSCOPY;  Service: Gastroenterology;  Laterality: N/A;   LASIK  11/06/2019   WISDOM TOOTH EXTRACTION      Home Medications: Prior to Admission  medications   Medication Sig Start Date End Date Taking? Authorizing Provider  atorvastatin (LIPITOR) 10 MG tablet Take 1 tablet (10 mg total) by mouth daily. 06/08/22  Yes Baity, Coralie Keens, NP  citalopram (CELEXA) 20 MG tablet TAKE 1 TABLET(20 MG) BY MOUTH DAILY 03/28/22  Yes Baity, Coralie Keens, NP  losartan (COZAAR) 100 MG tablet TAKE 1 TABLET(100 MG) BY MOUTH DAILY 02/27/22  Yes Baity, Coralie Keens, NP  rizatriptan (MAXALT) 5 MG tablet Take 5 mg by mouth as needed for migraine. May repeat in 2 hours if needed   Yes [provider]  Vitamin D, Ergocalciferol, (DRISDOL) 1.25 MG (50000 UNIT) CAPS capsule Take 1 capsule (50,000 Units total) by mouth every 7 (seven) days. 06/12/22  Yes Jearld Fenton, NP    Allergies: Allergies  Allergen Reactions   Sulfa Antibiotics     Social History:  reports that she has never smoked. She has never used smokeless tobacco. She reports current alcohol use. She reports that she does not use drugs.   Family History: Family History  Problem Relation Age of Onset   Heart disease Mother    Hypertension Mother    Diabetes Mellitus II Mother    Hypothyroidism Mother    Anxiety disorder Mother    Liver disease Mother    Obesity Mother    Obesity Father    Thyroid disease Father    Hyperlipidemia Father  Hypertension Father    Heart disease Father    Heart attack Father    Diabetes Mellitus II Father    Sudden death Father    Breast cancer Maternal Grandmother    Cancer Maternal Grandmother    Diabetes Maternal Grandfather    Heart attack Paternal Grandfather    Colon cancer Neg Hx     Review of Systems: Review of Systems  Constitutional:  Positive for chills and fever.  HENT:  Negative for hearing loss.   Respiratory:  Negative for shortness of breath.   Cardiovascular:  Negative for chest pain.  Gastrointestinal:  Positive for abdominal pain. Negative for constipation, diarrhea, nausea and vomiting.  Genitourinary:  Negative for dysuria.   Musculoskeletal:  Negative for myalgias.  Skin:  Negative for rash.  Neurological:  Negative for dizziness.  Psychiatric/Behavioral:  Negative for depression.     Physical Exam BP (!) 149/90   Pulse (!) 105   Temp 99 F (37.2 C) (Oral)   Resp 17   Ht '5\' 4"'$  (1.626 m)   Wt 80.7 kg   SpO2 94%   BMI 30.55 kg/m  CONSTITUTIONAL: No acute distress, well nourished. HEENT:  Normocephalic, atraumatic, extraocular motion intact. NECK: Trachea is midline, and there is no jugular venous distension.  RESPIRATORY:  Normal respiratory effort without pathologic use of accessory muscles. CARDIOVASCULAR: Regular rhythm and rate. GI: The abdomen is soft, non-distended, with tenderness to palpation localized to the right lower quadrant.  No diffuse peritonitis, non-toxic.  MUSCULOSKELETAL:  Normal muscle strength and tone in all four extremities.  No peripheral edema or cyanosis. SKIN: Skin turgor is normal. There are no pathologic skin lesions.  NEUROLOGIC:  Motor and sensation is grossly normal.  Cranial nerves are grossly intact. PSYCH:  Alert and oriented to person, place and time. Affect is normal.  Laboratory Analysis: Results for orders placed or performed during the hospital encounter of 07/19/22 (from the past 24 hour(s))  Lipase, blood     Status: None   Collection Time: 07/19/22  2:48 PM  Result Value Ref Range   Lipase 32 11 - 51 U/L  Comprehensive metabolic panel     Status: Abnormal   Collection Time: 07/19/22  2:48 PM  Result Value Ref Range   Sodium 137 135 - 145 mmol/L   Potassium 3.3 (L) 3.5 - 5.1 mmol/L   Chloride 107 98 - 111 mmol/L   CO2 21 (L) 22 - 32 mmol/L   Glucose, Bld 102 (H) 70 - 99 mg/dL   BUN 10 6 - 20 mg/dL   Creatinine, Ser 0.82 0.44 - 1.00 mg/dL   Calcium 9.3 8.9 - 10.3 mg/dL   Total Protein 7.4 6.5 - 8.1 g/dL   Albumin 3.9 3.5 - 5.0 g/dL   AST 16 15 - 41 U/L   ALT 19 0 - 44 U/L   Alkaline Phosphatase 59 38 - 126 U/L   Total Bilirubin 1.1 0.3 - 1.2  mg/dL   GFR, Estimated >60 >60 mL/min   Anion gap 9 5 - 15  CBC     Status: Abnormal   Collection Time: 07/19/22  2:48 PM  Result Value Ref Range   WBC 16.3 (H) 4.0 - 10.5 K/uL   RBC 4.29 3.87 - 5.11 MIL/uL   Hemoglobin 12.5 12.0 - 15.0 g/dL   HCT 37.5 36.0 - 46.0 %   MCV 87.4 80.0 - 100.0 fL   MCH 29.1 26.0 - 34.0 pg   MCHC 33.3 30.0 - 36.0  g/dL   RDW 12.8 11.5 - 15.5 %   Platelets 247 150 - 400 K/uL   nRBC 0.0 0.0 - 0.2 %  Urinalysis, Routine w reflex microscopic Urine, Clean Catch     Status: Abnormal   Collection Time: 07/19/22  3:39 PM  Result Value Ref Range   Color, Urine YELLOW YELLOW   APPearance CLEAR (A) CLEAR   Specific Gravity, Urine 1.020 1.005 - 1.030   pH 5.5 5.0 - 8.0   Glucose, UA NEGATIVE NEGATIVE mg/dL   Hgb urine dipstick NEGATIVE NEGATIVE   Bilirubin Urine NEGATIVE NEGATIVE   Ketones, ur NEGATIVE NEGATIVE mg/dL   Protein, ur 30 (A) NEGATIVE mg/dL   Nitrite NEGATIVE NEGATIVE   Leukocytes,Ua SMALL (A) NEGATIVE   RBC / HPF 0-5 0 - 5 RBC/hpf   WBC, UA 21-50 0 - 5 WBC/hpf   Bacteria, UA RARE (A) NONE SEEN   Squamous Epithelial / LPF 21-50 0 - 5   Mucus PRESENT   Lactic acid, plasma     Status: None   Collection Time: 07/19/22  4:30 PM  Result Value Ref Range   Lactic Acid, Venous 1.2 0.5 - 1.9 mmol/L    Imaging: CT Abdomen Pelvis W Contrast  Result Date: 07/19/2022 CLINICAL DATA:  Acute abdominal pain. Lower abdominal pain, onset last night after colonoscopy yesterday. Pelvic pain. EXAM: CT ABDOMEN AND PELVIS WITH CONTRAST TECHNIQUE: Multidetector CT imaging of the abdomen and pelvis was performed using the standard protocol following bolus administration of intravenous contrast. RADIATION DOSE REDUCTION: This exam was performed according to the departmental dose-optimization program which includes automated exposure control, adjustment of the mA and/or kV according to patient size and/or use of iterative reconstruction technique. CONTRAST:  145m  OMNIPAQUE IOHEXOL 300 MG/ML  SOLN COMPARISON:  None Available. FINDINGS: Lower chest: Bandlike atelectasis in both lower lobes. No pleural fluid. Hepatobiliary: Diffusely decreased hepatic density typical of steatosis. No focal liver lesion. Gallbladder physiologically distended, no calcified stone. No biliary dilatation. Pancreas: Unremarkable. No pancreatic ductal dilatation or surrounding inflammatory changes. Spleen: Normal in size without focal abnormality. Incidental splenule at the hilum. Adrenals/Urinary Tract: No adrenal nodule. No hydronephrosis or perinephric edema. Homogeneous renal enhancement. Urinary bladder is minimally distended, no wall thickening or intravesicular air. Stomach/Bowel: Scattered free air in the anterior right abdomen. There is a small amount of free fluid adjacent to the cecum in the right lower quadrant, equivocal adjacent cecal wall thickening, series 2, image 71. Metallic density within the lateral cecum/ascending colon may represent a clip. No other sites of colonic wall thickening or pericolonic edema. Small volume of formed stool in the colon. The appendix is normal. Few fluid-filled distal ileal small bowel loops. No small bowel dilatation or obstruction. Unremarkable appearance of the stomach. There is a tiny duodenal diverticulum. Vascular/Lymphatic: Normal caliber abdominal aorta patent portal, splenic, and superior mesenteric veins. No acute vascular findings. No adenopathy. Reproductive: 15 mm cyst or follicle in the left ovary. 17 mm simple cyst or follicle in the right ovary. No specific imaging follow-up is recommended. The uterus is anteverted. Other: Small amount of free fluid adjacent to the cecum and in the right lower quadrant. Scattered free air within the right anterior abdomen and right upper quadrant. No focal fluid collection or abscess. Musculoskeletal: There are no acute or suspicious osseous abnormalities. IMPRESSION: 1. Scattered free air in the right  anterior abdomen and right upper quadrant, typically seen with perforated viscus. There is a small amount of free fluid adjacent to  the cecum with adjacent wall thickening, which could represent site of perforation related to recent colonoscopy. Metallic density within the lateral cecum/ascending colon is likely a clip, this clip is not in the region of the wall thickening. 2. No abscess. 3. Hepatic steatosis. Critical Value/emergent results were called by telephone at the time of interpretation on 07/19/2022 at 4:04 pm to provider [facility] , who verbally acknowledged these results. Electronically Signed   By: Keith Rake M.D.   On: 07/19/2022 16:04    Assessment and Plan: This is a 46 y.o. female with iatrogenic colon perforation from colonoscopy.  --On initial evaluation, the patient does not appear peritonitic and is non-toxic.  Discussed with her that my suspicion is for an iatrogenic perforation in the cecum at the polypectomy site.  There is some small amount of fluid around cecum and her pain is localized there.  The CT scan does show some small free air, but no diffuse.  Her lactic acid is normal.  I discussed with the patient that given her findings and exam, I think this is something that could potentially be managed conservatively.  However, also discussed that if she were to clinically deteriorate or not improve, we would have to go to OR for exploration. --Patient will be admitted to the surgical team.  I will keep her NPO for now, with IV fluid hydration, appropriate pain control, and IV antibiotics for the perforation.  Discussed that we'll repeat labs in AM, can obtain a KUB to evaluate for free air, and follow her clinically to see if she will improve or if she would need surgery.  She understands this plan and all of her questions have been answered.  Also discussed with Dr. Marius Ditch.  I spent 75 minutes dedicated to the care of this patient on the date of this encounter to include  pre-visit review of records, face-to-face time with the patient discussing diagnosis and management, and any post-visit coordination of care.   Melvyn Neth, MD [facility] Pg:  [phone]

## 2022-07-19 NOTE — Telephone Encounter (Signed)
Please have her go to the emergency room right away to have a CT scan, to rule out any perforation of the colon.  Sherri Sear, MD

## 2022-07-19 NOTE — Telephone Encounter (Signed)
Patient verbalized understanding she states she is on the way to the emergency room now

## 2022-07-19 NOTE — Telephone Encounter (Signed)
Per pt phone call had colonoscopy yesterday and now having some complication.

## 2022-07-20 ENCOUNTER — Inpatient Hospital Stay: Payer: BC Managed Care – PPO | Admitting: Anesthesiology

## 2022-07-20 ENCOUNTER — Encounter: Payer: Self-pay | Admitting: Surgery

## 2022-07-20 ENCOUNTER — Encounter
Admission: EM | Disposition: A | Discharge status: Home / Self Care | Payer: Self-pay | Source: Home / Self Care | Attending: Surgery

## 2022-07-20 ENCOUNTER — Other Ambulatory Visit: Payer: Self-pay

## 2022-07-20 ENCOUNTER — Inpatient Hospital Stay: Payer: BC Managed Care – PPO

## 2022-07-20 DIAGNOSIS — K631 Perforation of intestine (nontraumatic): Principal | ICD-10-CM

## 2022-07-20 DIAGNOSIS — K3532 Acute appendicitis with perforation and localized peritonitis, without abscess: Secondary | ICD-10-CM | POA: Diagnosis not present

## 2022-07-20 HISTORY — PX: APPENDECTOMY: SHX54

## 2022-07-20 HISTORY — PX: LAPAROTOMY: SHX154

## 2022-07-20 LAB — BASIC METABOLIC PANEL
Anion gap: 8 (ref 5–15)
BUN: 9 mg/dL (ref 6–20)
CO2: 23 mmol/L (ref 22–32)
Calcium: 8.8 mg/dL — ABNORMAL LOW (ref 8.9–10.3)
Chloride: 107 mmol/L (ref 98–111)
Creatinine, Ser: 0.96 mg/dL (ref 0.44–1.00)
GFR, Estimated: >60 mL/min (ref >60)
Glucose, Bld: 105 mg/dL — ABNORMAL HIGH (ref 70–99)
Potassium: 3.3 mmol/L — ABNORMAL LOW (ref 3.5–5.1)
Sodium: 138 mmol/L (ref 135–145)

## 2022-07-20 LAB — CBC
HCT: 38.7 % (ref 36.0–46.0)
Hemoglobin: 12.8 g/dL (ref 12.0–15.0)
MCH: 29.2 pg (ref 26.0–34.0)
MCHC: 33.1 g/dL (ref 30.0–36.0)
MCV: 88.2 fL (ref 80.0–100.0)
Platelets: 200 10*3/uL (ref 150–400)
RBC: 4.39 MIL/uL (ref 3.87–5.11)
RDW: 12.9 % (ref 11.5–15.5)
WBC: 13.4 10*3/uL — ABNORMAL HIGH (ref 4.0–10.5)
nRBC: 0 % (ref 0.0–0.2)

## 2022-07-20 LAB — HIV ANTIBODY (ROUTINE TESTING W REFLEX): HIV Screen 4th Generation wRfx: NONREACTIVE

## 2022-07-20 LAB — MAGNESIUM: Magnesium: 1.7 mg/dL (ref 1.7–2.4)

## 2022-07-20 SURGERY — LAPAROTOMY, EXPLORATORY
Anesthesia: General | Site: Abdomen

## 2022-07-20 MED ORDER — DEXAMETHASONE SODIUM PHOSPHATE 10 MG/ML IJ SOLN
INTRAMUSCULAR | Status: AC
  Filled 2022-07-20: qty 1

## 2022-07-20 MED ORDER — BUPIVACAINE-EPINEPHRINE (PF) 0.5% -1:200000 IJ SOLN
INTRAMUSCULAR | Status: AC
  Filled 2022-07-20: qty 30

## 2022-07-20 MED ORDER — SODIUM CHLORIDE 0.9 % IV SOLN: INTRAVENOUS | Status: DC | PRN

## 2022-07-20 MED ORDER — SODIUM CHLORIDE (PF) 0.9 % IJ SOLN
INTRAMUSCULAR | Status: DC | PRN
  Administered 2022-07-20: 80 mL via INTRAMUSCULAR

## 2022-07-20 MED ORDER — BUPIVACAINE LIPOSOME 1.3 % IJ SUSP
INTRAMUSCULAR | Status: AC
  Filled 2022-07-20: qty 20

## 2022-07-20 MED ORDER — LACTATED RINGERS IV SOLN: INTRAVENOUS | Status: DC

## 2022-07-20 MED ORDER — FENTANYL CITRATE (PF) 100 MCG/2ML IJ SOLN
25.0000 ug | INTRAMUSCULAR | Status: DC | PRN
  Administered 2022-07-20: 50 ug via INTRAVENOUS

## 2022-07-20 MED ORDER — SODIUM CHLORIDE FLUSH 0.9 % IV SOLN
INTRAVENOUS | Status: AC
  Filled 2022-07-20: qty 30

## 2022-07-20 MED ORDER — ONDANSETRON HCL 4 MG/2ML IJ SOLN
INTRAMUSCULAR | Status: DC | PRN
  Administered 2022-07-20: 4 mg via INTRAVENOUS

## 2022-07-20 MED ORDER — DEXAMETHASONE SODIUM PHOSPHATE 10 MG/ML IJ SOLN
INTRAMUSCULAR | Status: DC | PRN
  Administered 2022-07-20: 10 mg via INTRAVENOUS

## 2022-07-20 MED ORDER — PHENYLEPHRINE HCL-NACL 20-0.9 MG/250ML-% IV SOLN
INTRAVENOUS | Status: AC
  Filled 2022-07-20: qty 250

## 2022-07-20 MED ORDER — LIDOCAINE HCL (CARDIAC) PF 100 MG/5ML IV SOSY
PREFILLED_SYRINGE | INTRAVENOUS | Status: DC | PRN
  Administered 2022-07-20: 80 mg via INTRAVENOUS

## 2022-07-20 MED ORDER — CHLORHEXIDINE GLUCONATE 0.12 % MT SOLN
OROMUCOSAL | Status: AC
  Filled 2022-07-20: qty 15

## 2022-07-20 MED ORDER — BUPIVACAINE-EPINEPHRINE (PF) 0.25% -1:200000 IJ SOLN
INTRAMUSCULAR | Status: AC
  Filled 2022-07-20: qty 30

## 2022-07-20 MED ORDER — PHENYLEPHRINE HCL-NACL 20-0.9 MG/250ML-% IV SOLN
INTRAVENOUS | Status: DC | PRN
  Administered 2022-07-20: 20 ug/min via INTRAVENOUS

## 2022-07-20 MED ORDER — ORAL CARE MOUTH RINSE: 15.0000 mL | Freq: Once | OROMUCOSAL | Status: AC

## 2022-07-20 MED ORDER — VASOPRESSIN 20 UNIT/ML IV SOLN
INTRAVENOUS | Status: AC
  Filled 2022-07-20: qty 1

## 2022-07-20 MED ORDER — MIDAZOLAM HCL 2 MG/2ML IJ SOLN
INTRAMUSCULAR | Status: AC
  Filled 2022-07-20: qty 2

## 2022-07-20 MED ORDER — FENTANYL CITRATE (PF) 100 MCG/2ML IJ SOLN
INTRAMUSCULAR | Status: AC
  Administered 2022-07-20: 50 ug via INTRAVENOUS
  Filled 2022-07-20: qty 2

## 2022-07-20 MED ORDER — KETAMINE HCL 10 MG/ML IJ SOLN
INTRAMUSCULAR | Status: DC | PRN
  Administered 2022-07-20: 30 mg via INTRAVENOUS

## 2022-07-20 MED ORDER — FENTANYL CITRATE (PF) 100 MCG/2ML IJ SOLN
INTRAMUSCULAR | Status: DC | PRN
  Administered 2022-07-20: 25 ug via INTRAVENOUS
  Administered 2022-07-20: 75 ug via INTRAVENOUS
  Administered 2022-07-20: 50 ug via INTRAVENOUS

## 2022-07-20 MED ORDER — PROPOFOL 10 MG/ML IV BOLUS
INTRAVENOUS | Status: DC | PRN
  Administered 2022-07-20: 140 mg via INTRAVENOUS

## 2022-07-20 MED ORDER — FENTANYL CITRATE (PF) 250 MCG/5ML IJ SOLN
INTRAMUSCULAR | Status: AC
  Filled 2022-07-20: qty 5

## 2022-07-20 MED ORDER — OXYCODONE HCL 5 MG PO TABS: 5.0000 mg | ORAL_TABLET | Freq: Once | ORAL | Status: DC | PRN

## 2022-07-20 MED ORDER — 0.9 % SODIUM CHLORIDE (POUR BTL) OPTIME
TOPICAL | Status: DC | PRN
  Administered 2022-07-20 (×2): 1000 mL
  Administered 2022-07-20: 500 mL

## 2022-07-20 MED ORDER — POTASSIUM CHLORIDE 10 MEQ/100ML IV SOLN: 10.0000 meq | INTRAVENOUS | Status: DC

## 2022-07-20 MED ORDER — ROCURONIUM BROMIDE 100 MG/10ML IV SOLN
INTRAVENOUS | Status: DC | PRN
  Administered 2022-07-20: 70 mg via INTRAVENOUS

## 2022-07-20 MED ORDER — PHENYLEPHRINE 80 MCG/ML (10ML) SYRINGE FOR IV PUSH (FOR BLOOD PRESSURE SUPPORT)
PREFILLED_SYRINGE | INTRAVENOUS | Status: AC
  Filled 2022-07-20: qty 10

## 2022-07-20 MED ORDER — CHLORHEXIDINE GLUCONATE 0.12 % MT SOLN
15.0000 mL | Freq: Once | OROMUCOSAL | Status: AC
  Administered 2022-07-20: 15 mL via OROMUCOSAL

## 2022-07-20 MED ORDER — KETAMINE HCL 50 MG/5ML IJ SOSY
PREFILLED_SYRINGE | INTRAMUSCULAR | Status: AC
  Filled 2022-07-20: qty 5

## 2022-07-20 MED ORDER — ONDANSETRON HCL 4 MG/2ML IJ SOLN
INTRAMUSCULAR | Status: AC
  Filled 2022-07-20: qty 2

## 2022-07-20 MED ORDER — OXYCODONE HCL 5 MG/5ML PO SOLN: 5.0000 mg | Freq: Once | ORAL | Status: DC | PRN

## 2022-07-20 MED ORDER — MIDAZOLAM HCL 2 MG/2ML IJ SOLN
INTRAMUSCULAR | Status: DC | PRN
  Administered 2022-07-20: 2 mg via INTRAVENOUS

## 2022-07-20 MED ORDER — POTASSIUM CHLORIDE 2 MEQ/ML IV SOLN
INTRAVENOUS | Status: DC
  Filled 2022-07-20 (×7): qty 1000

## 2022-07-20 MED ORDER — SUGAMMADEX SODIUM 200 MG/2ML IV SOLN
INTRAVENOUS | Status: DC | PRN
  Administered 2022-07-20: 200 mg via INTRAVENOUS

## 2022-07-20 MED ORDER — VASOPRESSIN 20 UNIT/ML IV SOLN
INTRAVENOUS | Status: DC | PRN
  Administered 2022-07-20: 2 [IU] via INTRAVENOUS
  Administered 2022-07-20 (×2): 1 [IU] via INTRAVENOUS

## 2022-07-20 MED ORDER — PHENYLEPHRINE 80 MCG/ML (10ML) SYRINGE FOR IV PUSH (FOR BLOOD PRESSURE SUPPORT)
PREFILLED_SYRINGE | INTRAVENOUS | Status: DC | PRN
  Administered 2022-07-20 (×2): 160 ug via INTRAVENOUS
  Administered 2022-07-20: 80 ug via INTRAVENOUS
  Administered 2022-07-20 (×2): 160 ug via INTRAVENOUS
  Administered 2022-07-20: 80 ug via INTRAVENOUS

## 2022-07-20 SURGICAL SUPPLY — 52 items
APL PRP STRL LF DISP 70% ISPRP (MISCELLANEOUS) ×1
BLADE CLIPPER SURG (BLADE) IMPLANT
BRR ADH 6X5 SEPRAFILM 1 SHT (MISCELLANEOUS)
BULB RESERV EVAC DRAIN JP 100C (MISCELLANEOUS) IMPLANT
CHLORAPREP W/TINT 26 (MISCELLANEOUS) ×1 IMPLANT
DRAIN CHANNEL JP 19F (MISCELLANEOUS) IMPLANT
DRAIN PENROSE 12X.25 LTX STRL (MISCELLANEOUS) IMPLANT
DRAPE LAPAROTOMY 100X77 ABD (DRAPES) ×1 IMPLANT
DRSG OPSITE POSTOP 4X12 (GAUZE/BANDAGES/DRESSINGS) IMPLANT
DRSG TEGADERM 4X10 (GAUZE/BANDAGES/DRESSINGS) IMPLANT
ELECT BLADE 6.5 EXT (BLADE) ×1 IMPLANT
ELECT CAUTERY BLADE TIP 2.5 (TIP) ×1
ELECT REM PT RETURN 9FT ADLT (ELECTROSURGICAL) ×1
ELECTRODE CAUTERY BLDE TIP 2.5 (TIP) ×1 IMPLANT
ELECTRODE REM PT RTRN 9FT ADLT (ELECTROSURGICAL) ×1 IMPLANT
GAUZE 4X4 16PLY ~~LOC~~+RFID DBL (SPONGE) ×1 IMPLANT
GAUZE SPONGE 4X4 12PLY STRL (GAUZE/BANDAGES/DRESSINGS) ×1 IMPLANT
GLOVE SURG SYN 7.0 (GLOVE) ×2 IMPLANT
GLOVE SURG SYN 7.0 PF PI (GLOVE) ×2 IMPLANT
GLOVE SURG SYN 7.5  E (GLOVE) ×2
GLOVE SURG SYN 7.5 E (GLOVE) ×2 IMPLANT
GLOVE SURG SYN 7.5 PF PI (GLOVE) ×2 IMPLANT
GOWN STRL REUS W/ TWL LRG LVL3 (GOWN DISPOSABLE) ×4 IMPLANT
GOWN STRL REUS W/TWL LRG LVL3 (GOWN DISPOSABLE) ×4
LABEL OR SOLS (LABEL) ×1 IMPLANT
LIGASURE IMPACT 36 18CM CVD LR (INSTRUMENTS) IMPLANT
MANIFOLD NEPTUNE II (INSTRUMENTS) ×1 IMPLANT
NEEDLE HYPO 22GX1.5 SAFETY (NEEDLE) ×1 IMPLANT
NS IRRIG 1000ML POUR BTL (IV SOLUTION) ×1 IMPLANT
NS IRRIG 500ML POUR BTL (IV SOLUTION) IMPLANT
PACK BASIN MAJOR ARMC (MISCELLANEOUS) ×1 IMPLANT
PACK COLON CLEAN CLOSURE (MISCELLANEOUS) ×1 IMPLANT
RELOAD PROXIMATE TA60MM BLUE (ENDOMECHANICALS) ×1 IMPLANT
RELOAD STAPLE 60 BLU REG PROX (ENDOMECHANICALS) IMPLANT
SEPRAFILM MEMBRANE 5X6 (MISCELLANEOUS) IMPLANT
SPONGE T-LAP 18X18 ~~LOC~~+RFID (SPONGE) ×3 IMPLANT
STAPLER GUN LINEAR PROX 60 (STAPLE) IMPLANT
STAPLER SKIN PROX 35W (STAPLE) ×1 IMPLANT
SUT ETHILON 3-0 FS-10 30 BLK (SUTURE) ×1
SUT PDS AB 1 CT1 36 (SUTURE) ×1 IMPLANT
SUT PROLENE 2 0 SH DA (SUTURE) IMPLANT
SUT SILK 2 0 (SUTURE) ×1
SUT SILK 2-0 18XBRD TIE 12 (SUTURE) ×1 IMPLANT
SUT SILK 3-0 (SUTURE) ×1 IMPLANT
SUT VIC AB 3-0 SH 27 (SUTURE) ×1
SUT VIC AB 3-0 SH 27X BRD (SUTURE) ×1 IMPLANT
SUTURE EHLN 3-0 FS-10 30 BLK (SUTURE) IMPLANT
SYR 10ML LL (SYRINGE) ×1 IMPLANT
SYR 20ML LL LF (SYRINGE) IMPLANT
TRAP FLUID SMOKE EVACUATOR (MISCELLANEOUS) ×1 IMPLANT
TRAY FOLEY MTR SLVR 16FR STAT (SET/KITS/TRAYS/PACK) ×1 IMPLANT
WATER STERILE IRR 500ML POUR (IV SOLUTION) ×1 IMPLANT

## 2022-07-20 NOTE — Anesthesia Procedure Notes (Signed)
Procedure Name: Intubation Date/Time: 07/20/2022 1:31 PM  Performed by: Esaw Grandchild, CRNAPre-anesthesia Checklist: Patient identified, Emergency Drugs available, Suction available and Patient being monitored Patient Re-evaluated:Patient Re-evaluated prior to induction Oxygen Delivery Method: Circle system utilized Preoxygenation: Pre-oxygenation with 100% oxygen Induction Type: IV induction Ventilation: Mask ventilation without difficulty Laryngoscope Size: Miller and 2 Grade View: Grade I Tube type: Oral Tube size: 6.5 mm Number of attempts: 1 Airway Equipment and Method: Stylet, Oral airway and Bite block Placement Confirmation: ETT inserted through vocal cords under direct vision, positive ETCO2 and breath sounds checked- equal and bilateral Secured at: 21 cm Tube secured with: Tape Dental Injury: Teeth and Oropharynx as per pre-operative assessment

## 2022-07-20 NOTE — Op Note (Signed)
  Procedure Date:  07/20/2022  Pre-operative Diagnosis:  Iatrogenic cecal perforation  Post-operative Diagnosis: Iatrogenic cecal perforation  Procedure:  Exploratory Laparotomy with open appendectomy and cecectomy  Surgeon:  Melvyn Neth, MD  Assistant:  Edison Simon, PA-C  Anesthesia:  General endotracheal  Estimated Blood Loss:  20 ml  Specimens:  appendix with cecum  Complications:  None  Indications for Procedure:  This is a 46 y.o. female who presents with abdominal pain and workup revealing pneumoperitoneum from an iatrogenic cecal perforation during colonoscopy.  Initial conservative management failed as her condition deteriorated this morning.  The risks of bleeding, abscess or infection, injury to surrounding structures, and need for further procedures were all discussed with the patient and was willing to proceed.  Description of Procedure: The patient was correctly identified in the preoperative area and brought into the operating room.  The patient was placed supine with VTE prophylaxis in place.  Appropriate time-outs were performed.  Anesthesia was induced and the patient was intubated.  Foley catheter was placed.  Appropriate antibiotics were infused.  The abdomen was prepped and draped in a sterile fashion.  A midline incision was made and electrocautery was used to dissect down the subcutaneous tissue to the fascia.  The fascia was incised and extended superiorly and inferiorly.  Upon entering the abdomen (organ space), I encountered feculent peritonitis.  The abdomen was explored revealing a 1 cm perforation in the mid to lateral aspect of the cecum.  This was initially closed with a 3-0 Silk suture in order to stop the succus spillage in her abdomen.  After this, we were able to explore the colon from rectosigmoid junction proximally to the terminal ileum.  There was only that perforation found.  The location of the perforation was such that it would be amenable to  resection of the appendix with part of the lateral cecum instead of needing a formal right colectomy.  The terminal ileum and appendix were mobilized and the cecum was mobilized by dividing along the white line of toldt.  This allowed for good exposure of the perforation site.  Using two loads of TA 60 blue load, the appendix and lateral cecum were resected, incorporating the perforation into the resection portion.  Then, the staple line was imbricated with multiple lembert 3-0 Silks.  The abdomen was then thoroughly irrigated.  A 19 Fr Blake drain was placed in RLQ going to pelvis.   At that point, we rescrubbed for our clean closure.  Exparel solution mixed with 0.5% bupivacaine with epi was infiltrated over the peritoneum, fascia, and subcutaneous tissue.   The fascia was then closed using #1 PDS sutures.  The midline wound was irrigated.  A 1/4 inch penrose drain was placed at the bottom of the wound and the wound was closed using 3-0 Vicryl and staples.  The drain was secured using 3-0 nylon suture.  The wound was dressed with Honeycomb dressing and the drain with 4x4 gauze and tegaderm.   The patient was emerged from anesthesia and extubated and brought to the recovery room for further management.  The patient tolerated the procedure well and all counts were correct at the end of the case.  Please note Mr. Olean Ree was present for the entire procedure, assisting with exposure, resection, suturing, and closing the abdomen.  Melvyn Neth, MD

## 2022-07-20 NOTE — Progress Notes (Signed)
[facility] Day(s): 1.   Interval History:  Patient seen and examined,  no acute events or new complaints overnight.  Patient reports she continues to have abdominal discomfort; tachycardia No fever, chills, nausea Leukocytosis improving; 13.4K Renal function normal; sCr - 0.96; UO - unmeasured Mild hypokalemia to 3.3 She is on Zosyn NPO  Vital signs in last 24 hours: [min-max] current  Temp:  [99 F (37.2 C)-99.5 F (37.5 C)] 99.3 F (37.4 C) (09/22 0358) Pulse Rate:  [105-110] 109 (09/22 0358) Resp:  [16-18] 16 (09/22 0358) BP: (117-149)/(62-93) 118/62 (09/22 0358) SpO2:  [90 %-96 %] 94 % (09/22 0358) Weight:  [79 kg-80.7 kg] 79 kg (09/22 0358)     Height: '5\' 4"'$  (162.6 cm) Weight: 79 kg BMI (Calculated): 29.88   Intake/Output last 2 shifts:  09/21 0701 - 09/22 0700 In: 1440.4 [I.V.:390.3; IV Piggyback:1050] Out: -    Physical Exam:  Constitutional: alert, cooperative and no distress  HENT: normocephalic without obvious abnormality  Eyes: PERRL, EOM's grossly intact and symmetric  Respiratory: breathing non-labored at rest  Cardiovascular: regular rate and sinus rhythm  Gastrointestinal: soft, abdomen remains tender, and non-distended  Labs:     Latest Ref Rng & Units 07/20/2022    5:28 AM 07/19/2022    2:48 PM 06/07/2022    9:09 AM  CBC  WBC 4.0 - 10.5 K/uL 13.4  16.3  7.9   Hemoglobin 12.0 - 15.0 g/dL 12.8  12.5  12.5   Hematocrit 36.0 - 46.0 % 38.7  37.5  37.2   Platelets 150 - 400 K/uL 200  247  257       Latest Ref Rng & Units 07/20/2022    5:28 AM 07/19/2022    2:48 PM 06/07/2022    9:09 AM  CMP  Glucose 70 - 99 mg/dL 105  102  81   BUN 6 - 20 mg/dL '9  10  12   '$ Creatinine 0.44 - 1.00 mg/dL 0.96  0.82  0.91   Sodium 135 - 145 mmol/L 138  137  139   Potassium 3.5 - 5.1 mmol/L 3.3  3.3  4.3   Chloride 98 - 111 mmol/L 107  107  106   CO2 22 - 32 mmol/L '23  21  26   '$ Calcium 8.9 - 10.3 mg/dL 8.8  9.3  9.1    Total Protein 6.5 - 8.1 g/dL  7.4  6.5   Total Bilirubin 0.3 - 1.2 mg/dL  1.1  0.4   Alkaline Phos 38 - 126 U/L  59    AST 15 - 41 U/L  16  12   ALT 0 - 44 U/L  19  15     Imaging studies:   KUB (07/20/2022) personally reviewed which shows likely increase in pneumoperitoneum, and radiologist report reviewed below: IMPRESSION: Pneumoperitoneum, suspect increased compared to yesterday's CT exam.   Increased bibasilar atelectasis.   Assessment/Plan:  46 y.o. female with persistent abdominal pain and tachycardia admitted with pneumoperitoneum thought to most likely be iatrogenic from recent colonoscopy and polypectomy    - Although leukocytosis is improved, she continues to have abdominal pain, tachycardia, and concerns for increasing pneumoperitoneum on XR this morning. Given this, we will plan to proceed with laparotomy today for exploration.    - All risks, benefits, and alternatives to above procedure(s) were discussed with the patient, all of her questions were answered to her expressed satisfaction, patient expresses she wishes to proceed, and  informed consent was obtained.    - Continue NPO + IVF support  - Continue IV Abx (Zosyn) - Monitor abdominal examination; on-going bowel function    - Pain control prn; antiemetics prn - Will replete K+   All of the above findings and recommendations were discussed with the patient, and the medical team, and all of patient's questions were answered to her expressed satisfaction.  -- Edison Simon, PA-C [facility] 07/20/2022, 7:27 AM M-F: 7am - 4pm

## 2022-07-20 NOTE — Plan of Care (Signed)

## 2022-07-20 NOTE — Anesthesia Postprocedure Evaluation (Signed)
Anesthesia Post Note  Patient: Katherine Foster  Procedure(s) Performed: EXPLORATORY LAPAROTOMY (Abdomen) APPENDECTOMY, CECETOMY (Abdomen)  Patient location during evaluation: PACU Anesthesia Type: General Level of consciousness: awake and alert Pain management: pain level controlled Vital Signs Assessment: post-procedure vital signs reviewed and stable Respiratory status: spontaneous breathing, nonlabored ventilation, respiratory function stable and patient connected to nasal cannula oxygen Cardiovascular status: blood pressure returned to baseline and stable Postop Assessment: no apparent nausea or vomiting Anesthetic complications: no   No notable events documented.   Last Vitals:  Vitals:   07/20/22 1645 07/20/22 1716  BP: (!) 120/59 122/76  Pulse: (!) 102 97  Resp: 18   Temp: 37.5 C   SpO2: 95% 97%    Last Pain:  Vitals:   07/20/22 1645  TempSrc:   PainSc: 3                  Precious Haws Aimee Heldman

## 2022-07-20 NOTE — Transfer of Care (Signed)
Immediate Anesthesia Transfer of Care Note  Patient: Novah Nessel  Procedure(s) Performed: EXPLORATORY LAPAROTOMY (Abdomen) APPENDECTOMY, CECETOMY (Abdomen)  Patient Location: PACU  Anesthesia Type:General  Level of Consciousness: awake, alert  and oriented  Airway & Oxygen Therapy: Patient Spontanous Breathing and Patient connected to face mask oxygen  Post-op Assessment: Report given to RN, Post -op Vital signs reviewed and stable and Patient moving all extremities  Post vital signs: Reviewed and stable  Last Vitals:  Vitals Value Taken Time  BP 122/75 07/20/22 1616  Temp    Pulse 107 07/20/22 1622  Resp 21 07/20/22 1622  SpO2 85 % 07/20/22 1622  Vitals shown include unvalidated device data.  Last Pain:  Vitals:   07/20/22 1237  TempSrc: Oral  PainSc: 5       Patients Stated Pain Goal: 0 (22/33/61 2244)  Complications: No notable events documented.

## 2022-07-20 NOTE — Anesthesia Preprocedure Evaluation (Signed)
Anesthesia Evaluation  Patient identified by MRN, date of birth, ID band Patient awake  General Assessment Comment:  Colon perforation 2/2 colonoscopy. Denies N/V.  Reviewed: Allergy & Precautions, NPO status , Patient's Chart, lab work & pertinent test results  History of Anesthesia Complications Negative for: history of anesthetic complications  Airway Mallampati: III  TM Distance: >3 FB Neck ROM: Full    Dental no notable dental hx. (+) Teeth Intact   Pulmonary neg pulmonary ROS, neg sleep apnea, neg COPD, Patient abstained from smoking.Not current smoker,    Pulmonary exam normal breath sounds clear to auscultation       Cardiovascular Exercise Tolerance: Good METShypertension, Pt. on medications (-) CAD and (-) Past MI (-) dysrhythmias  Rhythm:Regular Rate:Normal - Systolic murmurs    Neuro/Psych  Headaches, PSYCHIATRIC DISORDERS Anxiety    GI/Hepatic neg GERD  ,(+)     (-) substance abuse  ,   Endo/Other  neg diabetes  Renal/GU negative Renal ROS     Musculoskeletal   Abdominal (+)  Abdomen: tender.    Peds  Hematology   Anesthesia Other Findings Past Medical History: No date: Chronic cough No date: Constipation No date: Hyperlipidemia No date: Hypertension No date: Joint pain No date: Lower extremity edema No date: Palpitations No date: SOB (shortness of breath) on exertion  Reproductive/Obstetrics                             Anesthesia Physical Anesthesia Plan  ASA: 2  Anesthesia Plan: General   Post-op Pain Management: Dilaudid IV   Induction: Intravenous  PONV Risk Score and Plan: 4 or greater and Ondansetron, Dexamethasone and Midazolam  Airway Management Planned: Oral ETT  Additional Equipment: None  Intra-op Plan:   Post-operative Plan: Extubation in OR  Informed Consent: I have reviewed the patients History and Physical, chart, labs and discussed  the procedure including the risks, benefits and alternatives for the proposed anesthesia with the patient or authorized representative who has indicated his/her understanding and acceptance.     Dental advisory given  Plan Discussed with: CRNA and Surgeon  Anesthesia Plan Comments: (Discussed risks of anesthesia with patient, including PONV, sore throat, lip/dental/eye damage. Rare risks discussed as well, such as cardiorespiratory and neurological sequelae, and allergic reactions. Discussed the role of CRNA in patient's perioperative care. Patient understands.)       Anesthesia Quick Evaluation

## 2022-07-21 ENCOUNTER — Encounter: Payer: Self-pay | Admitting: Surgery

## 2022-07-21 DIAGNOSIS — K668 Other specified disorders of peritoneum: Secondary | ICD-10-CM

## 2022-07-21 LAB — BASIC METABOLIC PANEL
Anion gap: 9 (ref 5–15)
BUN: 14 mg/dL (ref 6–20)
CO2: 20 mmol/L — ABNORMAL LOW (ref 22–32)
Calcium: 8.6 mg/dL — ABNORMAL LOW (ref 8.9–10.3)
Chloride: 110 mmol/L (ref 98–111)
Creatinine, Ser: 1.05 mg/dL — ABNORMAL HIGH (ref 0.44–1.00)
GFR, Estimated: >60 mL/min (ref >60)
Glucose, Bld: 149 mg/dL — ABNORMAL HIGH (ref 70–99)
Potassium: 4.5 mmol/L (ref 3.5–5.1)
Sodium: 139 mmol/L (ref 135–145)

## 2022-07-21 LAB — CBC
HCT: 37.4 % (ref 36.0–46.0)
Hemoglobin: 11.9 g/dL — ABNORMAL LOW (ref 12.0–15.0)
MCH: 28.7 pg (ref 26.0–34.0)
MCHC: 31.8 g/dL (ref 30.0–36.0)
MCV: 90.3 fL (ref 80.0–100.0)
Platelets: 213 10*3/uL (ref 150–400)
RBC: 4.14 MIL/uL (ref 3.87–5.11)
RDW: 13.2 % (ref 11.5–15.5)
WBC: 15.4 10*3/uL — ABNORMAL HIGH (ref 4.0–10.5)
nRBC: 0 % (ref 0.0–0.2)

## 2022-07-21 LAB — MAGNESIUM: Magnesium: 1.9 mg/dL (ref 1.7–2.4)

## 2022-07-21 MED ORDER — PREGABALIN 50 MG PO CAPS
100.0000 mg | ORAL_CAPSULE | Freq: Three times a day (TID) | ORAL | Status: DC
  Administered 2022-07-21 – 2022-07-24 (×10): 100 mg via ORAL
  Filled 2022-07-21 (×10): qty 2

## 2022-07-21 NOTE — Plan of Care (Signed)

## 2022-07-21 NOTE — Progress Notes (Signed)
POD # 1 AVSS On sips No flatus Still having pain that is expected   PE NAD Abd: soft, incision c/d/I w intact dressing. No infection, JP serosanguinous output  A/P developing ileus Keep cld for today Mobilize Add lyrica for multimodal pain rx DC foley

## 2022-07-21 NOTE — Progress Notes (Signed)
Katherine Lame, MD [facility]   [address] [place], [place] [address] Phone: [phone] Fax : [phone]   Subjective: The patient reports that she is doing very well this morning without any significant abdominal pain.  The patient had a seek ectomy with primary anastomosis after having a colonoscopy with a microperforation and free air in the abdomen.  The patient is sitting up in the chair at this time.   Objective: Vital signs in last 24 hours: Vitals:   07/20/22 1716 07/20/22 1956 07/21/22 0409 07/21/22 0700  BP: 122/76 128/86 133/82 113/77  Pulse: 97 88 (!) 55 63  Resp:  '16 16 16  '$ Temp:  98.6 F (37 C) (!) 97.4 F (36.3 C) 98.1 F (36.7 C)  TempSrc:  Oral Oral Oral  SpO2: 97% 96% 98% 96%  Weight:      Height:       Weight change: -1.74 kg  Intake/Output Summary (Last 24 hours) at 07/21/2022 1203 Last data filed at 07/21/2022 1000 Gross per 24 hour  Intake 3542.05 ml  Output 882 ml  Net 2660.05 ml     Exam: General: Patient in no apparent distress sitting up in a chair speaking full senses alert and oriented x3.   Lab Results: '@LABTEST2'$ @ Micro Results: Recent Results (from the past 240 hour(s))  Culture, blood (routine x 2)     Status: None (Preliminary result)   Collection Time: 07/19/22  4:05 PM   Specimen: BLOOD LEFT ARM  Result Value Ref Range Status   Specimen Description BLOOD LEFT ARM  Final   Special Requests   Final    BOTTLES DRAWN AEROBIC AND ANAEROBIC Blood Culture adequate volume   Culture   Final    NO GROWTH 2 DAYS Performed at [facility], [address] [place], [place] [address]    Report Status PENDING  Incomplete  Culture, blood (routine x 2)     Status: None (Preliminary result)   Collection Time: 07/19/22  4:10 PM   Specimen: BLOOD RIGHT ARM  Result Value Ref Range Status   Specimen Description BLOOD RIGHT ARM  Final   Special Requests   Final    BOTTLES DRAWN AEROBIC AND ANAEROBIC Blood Culture adequate  volume   Culture   Final    NO GROWTH 2 DAYS Performed at [facility], [address], [place], [place] [address]    Report Status PENDING  Incomplete   Studies/Results: DG Abd 2 Views  Result Date: 07/20/2022 CLINICAL DATA:  Pneumoperitoneum EXAM: ABDOMEN - 2 VIEW COMPARISON:  07/19/2022 FINDINGS: Pneumoperitoneum beneath both hemidiaphragms on the upright exam. Increased basilar atelectasis. Nonobstructive bowel gas pattern. Endoscopic clip suspected in the proximal right colon. No acute osseous finding. Contrast in the bladder from prior CT. IMPRESSION: Pneumoperitoneum, suspect increased compared to yesterday's CT exam. Increased bibasilar atelectasis. Electronically Signed   By: Jerilynn Mages.  Shick M.D.   On: 07/20/2022 06:53   CT Abdomen Pelvis W Contrast  Result Date: 07/19/2022 CLINICAL DATA:  Acute abdominal pain. Lower abdominal pain, onset last night after colonoscopy yesterday. Pelvic pain. EXAM: CT ABDOMEN AND PELVIS WITH CONTRAST TECHNIQUE: Multidetector CT imaging of the abdomen and pelvis was performed using the standard protocol following bolus administration of intravenous contrast. RADIATION DOSE REDUCTION: This exam was performed according to the departmental dose-optimization program which includes automated exposure control, adjustment of the mA and/or kV according to patient size and/or use of iterative reconstruction technique. CONTRAST:  169m OMNIPAQUE IOHEXOL 300 MG/ML  SOLN COMPARISON:  None Available. FINDINGS: Lower chest: Bandlike atelectasis in both lower lobes. No pleural fluid. Hepatobiliary: Diffusely decreased hepatic density typical of steatosis. No focal liver lesion. Gallbladder physiologically distended, no calcified stone. No biliary dilatation. Pancreas: Unremarkable. No pancreatic ductal dilatation or surrounding inflammatory changes. Spleen: Normal in size without focal abnormality. Incidental splenule at the hilum. Adrenals/Urinary Tract: No adrenal nodule.  No hydronephrosis or perinephric edema. Homogeneous renal enhancement. Urinary bladder is minimally distended, no wall thickening or intravesicular air. Stomach/Bowel: Scattered free air in the anterior right abdomen. There is a small amount of free fluid adjacent to the cecum in the right lower quadrant, equivocal adjacent cecal wall thickening, series 2, image 71. Metallic density within the lateral cecum/ascending colon may represent a clip. No other sites of colonic wall thickening or pericolonic edema. Small volume of formed stool in the colon. The appendix is normal. Few fluid-filled distal ileal small bowel loops. No small bowel dilatation or obstruction. Unremarkable appearance of the stomach. There is a tiny duodenal diverticulum. Vascular/Lymphatic: Normal caliber abdominal aorta patent portal, splenic, and superior mesenteric veins. No acute vascular findings. No adenopathy. Reproductive: 15 mm cyst or follicle in the left ovary. 17 mm simple cyst or follicle in the right ovary. No specific imaging follow-up is recommended. The uterus is anteverted. Other: Small amount of free fluid adjacent to the cecum and in the right lower quadrant. Scattered free air within the right anterior abdomen and right upper quadrant. No focal fluid collection or abscess. Musculoskeletal: There are no acute or suspicious osseous abnormalities. IMPRESSION: 1. Scattered free air in the right anterior abdomen and right upper quadrant, typically seen with perforated viscus. There is a small amount of free fluid adjacent to the cecum with adjacent wall thickening, which could represent site of perforation related to recent colonoscopy. Metallic density within the lateral cecum/ascending colon is likely a clip, this clip is not in the region of the wall thickening. 2. No abscess. 3. Hepatic steatosis. Critical Value/emergent results were called by telephone at the time of interpretation on 07/19/2022 at 4:04 pm to provider [facility] , who verbally acknowledged these results. Electronically Signed   By: Keith Rake M.D.   On: 07/19/2022 16:04   Medications: I have reviewed the patient's current medications. Scheduled Meds:  acetaminophen  1,000 mg Oral Q6H   citalopram  20 mg Oral Daily   ketorolac  30 mg Intravenous Q6H   losartan  100 mg Oral Daily   pantoprazole (PROTONIX) IV  40 mg Intravenous QHS   pregabalin  100 mg Oral TID   Continuous Infusions:  sodium chloride 10 mL/hr at 07/21/22 0349   lactated ringers 1,000 mL with potassium chloride 20 mEq infusion 100 mL/hr at 07/21/22 0741   piperacillin-tazobactam (ZOSYN)  IV 3.375 g (07/21/22 0642)   PRN Meds:.sodium chloride, HYDROmorphone (DILAUDID) injection, ondansetron **OR** ondansetron (ZOFRAN) IV, oxyCODONE, polyethylene glycol   Assessment: Principal Problem:   Pneumoperitoneum    Plan: This patient is status post colonoscopy with polyp resection in the cecum which resulted in a small perforation that was found and then repaired by surgery yesterday.  Patient is doing very well today without any complaints.  I would recommend advancing the patient's diet as tolerated when agreeable by surgery.  Otherwise no further input at this time.   LOS: 2 days   Lewayne Bunting 07/21/2022, 12:03 PM Pager [phone] 7am-5pm  Check AMION for 5pm -7am coverage and on weekends

## 2022-07-22 NOTE — Plan of Care (Signed)

## 2022-07-22 NOTE — Progress Notes (Signed)
POD # 2 AVSS Tolerated CLD + flatus Pain improving     PE NAD Abd: soft, incision c/d/I w intact dressing. No infection, JP serosanguinous output   A/P advance diet as tolerated DC in am Mobilize Complete 1 week outpt a/bs rx Drain may come out before DC

## 2022-07-23 LAB — CBC
HCT: 32.3 % — ABNORMAL LOW (ref 36.0–46.0)
Hemoglobin: 10.1 g/dL — ABNORMAL LOW (ref 12.0–15.0)
MCH: 28.7 pg (ref 26.0–34.0)
MCHC: 31.3 g/dL (ref 30.0–36.0)
MCV: 91.8 fL (ref 80.0–100.0)
Platelets: 241 10*3/uL (ref 150–400)
RBC: 3.52 MIL/uL — ABNORMAL LOW (ref 3.87–5.11)
RDW: 13.5 % (ref 11.5–15.5)
WBC: 11.8 10*3/uL — ABNORMAL HIGH (ref 4.0–10.5)
nRBC: 0.2 % (ref 0.0–0.2)

## 2022-07-23 LAB — BASIC METABOLIC PANEL
Anion gap: 4 — ABNORMAL LOW (ref 5–15)
BUN: 27 mg/dL — ABNORMAL HIGH (ref 6–20)
CO2: 25 mmol/L (ref 22–32)
Calcium: 8.6 mg/dL — ABNORMAL LOW (ref 8.9–10.3)
Chloride: 112 mmol/L — ABNORMAL HIGH (ref 98–111)
Creatinine, Ser: 1.5 mg/dL — ABNORMAL HIGH (ref 0.44–1.00)
GFR, Estimated: 43 mL/min — ABNORMAL LOW (ref >60)
Glucose, Bld: 85 mg/dL (ref 70–99)
Potassium: 4.2 mmol/L (ref 3.5–5.1)
Sodium: 141 mmol/L (ref 135–145)

## 2022-07-23 LAB — SURGICAL PATHOLOGY

## 2022-07-23 LAB — MAGNESIUM: Magnesium: 2.2 mg/dL (ref 1.7–2.4)

## 2022-07-23 MED ORDER — SODIUM CHLORIDE 0.9 % IV BOLUS
1000.0000 mL | Freq: Once | INTRAVENOUS | Status: AC
  Administered 2022-07-23: 1000 mL via INTRAVENOUS

## 2022-07-23 MED ORDER — ALUM & MAG HYDROXIDE-SIMETH 200-200-20 MG/5ML PO SUSP
30.0000 mL | Freq: Four times a day (QID) | ORAL | Status: DC | PRN
  Administered 2022-07-23: 30 mL via ORAL
  Filled 2022-07-23: qty 30

## 2022-07-23 NOTE — TOC Initial Note (Signed)
Transition of Care [facility]) - Initial/Assessment Note    Patient Details  Name: Katherine Foster MRN: [id number] Date of Birth: 12/04/1975  [facility] CM/SW Contact:    Beverly Sessions, RN Phone Number: 07/23/2022, 3:44 PM  Clinical Narrative:                  Transition of Care [facility]) Screening Note   Patient Details  Name: Katherine Foster Date of Birth: 1976-09-30   Transition of Care [facility]) CM/SW Contact:    Beverly Sessions, RN Phone Number: 07/23/2022, 3:44 PM    Transition of Care Department [facility]) has reviewed patient and no TOC needs have been identified at this time. We will continue to monitor patient advancement through interdisciplinary progression rounds. If new patient transition needs arise, please place a TOC consult.          Patient Goals and CMS Choice        Expected Discharge Plan and Services                                                Prior Living Arrangements/Services                       Activities of Daily Living Home Assistive Devices/Equipment: None ADL Screening (condition at time of admission) Patient's cognitive ability adequate to safely complete daily activities?: Yes Is the patient deaf or have difficulty hearing?: No Does the patient have difficulty seeing, even when wearing glasses/contacts?: No Does the patient have difficulty concentrating, remembering, or making decisions?: No Patient able to express need for assistance with ADLs?: Yes Does the patient have difficulty dressing or bathing?: No Independently performs ADLs?: Yes (appropriate for developmental age) Does the patient have difficulty walking or climbing stairs?: No Weakness of Legs: None Weakness of Arms/Hands: None  Permission Sought/Granted                  Emotional Assessment              Admission diagnosis:  Pneumoperitoneum [K66.8] Bowel perforation ([place]) [K63.1] Patient Active Problem List   Diagnosis  Date Noted   Pneumoperitoneum 07/19/2022   Bowel perforation ([place])    History of colonic polyps    GAD (generalized anxiety disorder) 08/22/2021   Mixed hyperlipidemia 08/03/2021   Cecal polyp    Polyp of ascending colon    Class 1 obesity due to excess calories with body mass index (BMI) of 30.0 to 30.9 in adult 05/25/2021   Osteoarthritis 12/29/2019   Essential hypertension 03/10/2013   Migraine 11/17/2008   PCP:  Jearld Fenton, NP Pharmacy:   [facility] [phone], [place] AT [place] [address] Phone: [phone] Fax: [phone]  [facility] [place], [place] [address] Phone: [phone] Fax: [phone]     Social Determinants of Health (SDOH) Interventions    Readmission Risk Interventions     No data to display

## 2022-07-23 NOTE — Progress Notes (Signed)
[facility] Day(s): 4.   Post op day(s): 3 Days Post-Op.   Interval History:  Patient seen and examined No acute events or new complaints overnight.  Patient reports she is feeling better; abdominal soreness No fever, chills, nausea, emesis, or urinary changes Leukocytosis continues to improve; 11.8K Hgb to 10.1; likely dilutional Interestingly did have a bump in sCr to 1.50 this morning; UO - unmeasured but she reports she is urinating well  No electrolyte derangements  She is on regular diet Passing flatus Ambulating   Vital signs in last 24 hours: [min-max] current  Temp:  [97.9 F (36.6 C)-98.2 F (36.8 C)] 98.2 F (36.8 C) (09/25 0502) Pulse Rate:  [72-80] 72 (09/25 0502) Resp:  [16-19] 16 (09/25 0502) BP: (126-163)/(68-93) 163/93 (09/25 0502) SpO2:  [94 %-98 %] 94 % (09/25 0502)     Height: '5\' 4"'$  (162.6 cm) Weight: 79 kg BMI (Calculated): 29.88   Intake/Output last 2 shifts:  09/24 0701 - 09/25 0700 In: 1348.7 [P.O.:840; I.V.:508.7] Out: 10 [Drains:10]   Physical Exam:  Constitutional: alert, cooperative and no distress  Respiratory: breathing non-labored at rest  Cardiovascular: regular rate and sinus rhythm  Gastrointestinal: soft, incisional soreness, non-distended, no rebound/guarding. Surgical drain in right abdomen; serosanguinous Integumentary: Laparotomy incision is CDI with staples, penrose in place, honeycomb.   Labs:     Latest Ref Rng & Units 07/23/2022    5:33 AM 07/21/2022    5:43 AM 07/20/2022    5:28 AM  CBC  WBC 4.0 - 10.5 K/uL 11.8  15.4  13.4   Hemoglobin 12.0 - 15.0 g/dL 10.1  11.9  12.8   Hematocrit 36.0 - 46.0 % 32.3  37.4  38.7   Platelets 150 - 400 K/uL 241  213  200       Latest Ref Rng & Units 07/23/2022    5:33 AM 07/21/2022    5:43 AM 07/20/2022    5:28 AM  CMP  Glucose 70 - 99 mg/dL 85  149  105   BUN 6 - 20 mg/dL '27  14  9   '$ Creatinine 0.44 - 1.00 mg/dL 1.50  1.05  0.96    Sodium 135 - 145 mmol/L 141  139  138   Potassium 3.5 - 5.1 mmol/L 4.2  4.5  3.3   Chloride 98 - 111 mmol/L 112  110  107   CO2 22 - 32 mmol/L '25  20  23   '$ Calcium 8.9 - 10.3 mg/dL 8.6  8.6  8.8     Imaging studies: No new pertinent imaging studies   Assessment/Plan: 46 y.o. female 3 Days Post-Op s/p exploratory laparotomy, cecectomy, appendectomy for iatrogenic cecal perforation   - Okay to continue regular diet  - Will give NS bolus given AKI; monitor UO  - Hold nephrotoxic agents  - Continue surgical drain; monitor output; will remove at DC  - Monitor abdominal examination; on-going bowel function  - Pain control prn; antiemetics prn  - Monitor renal function; morning labs   - Mobilize  - Discharge Planning; Given AKI, will keep another 24 hours to ensure improvement. Anticipate DC in 24 hours pending repeat labs.    All of the above findings and recommendations were discussed with the patient, and the medical team, and all of patient's and questions were answered to her expressed satisfaction.  -- Edison Simon, PA-C [facility] 07/23/2022, 9:12 AM M-F: 7am - 4pm

## 2022-07-24 ENCOUNTER — Telehealth: Payer: Self-pay

## 2022-07-24 ENCOUNTER — Encounter: Payer: Self-pay | Admitting: Gastroenterology

## 2022-07-24 LAB — BASIC METABOLIC PANEL
Anion gap: 5 (ref 5–15)
BUN: 17 mg/dL (ref 6–20)
CO2: 22 mmol/L (ref 22–32)
Calcium: 8.3 mg/dL — ABNORMAL LOW (ref 8.9–10.3)
Chloride: 113 mmol/L — ABNORMAL HIGH (ref 98–111)
Creatinine, Ser: 1.24 mg/dL — ABNORMAL HIGH (ref 0.44–1.00)
GFR, Estimated: 54 mL/min — ABNORMAL LOW (ref >60)
Glucose, Bld: 88 mg/dL (ref 70–99)
Potassium: 3.8 mmol/L (ref 3.5–5.1)
Sodium: 140 mmol/L (ref 135–145)

## 2022-07-24 LAB — IRON AND TIBC
Iron: 14 ug/dL — ABNORMAL LOW (ref 28–170)
Saturation Ratios: 5 % — ABNORMAL LOW (ref 10.4–31.8)
TIBC: 287 ug/dL (ref 250–450)
UIBC: 273 ug/dL

## 2022-07-24 LAB — CBC
HCT: 29.9 % — ABNORMAL LOW (ref 36.0–46.0)
Hemoglobin: 9.5 g/dL — ABNORMAL LOW (ref 12.0–15.0)
MCH: 28.6 pg (ref 26.0–34.0)
MCHC: 31.8 g/dL (ref 30.0–36.0)
MCV: 90.1 fL (ref 80.0–100.0)
Platelets: 233 10*3/uL (ref 150–400)
RBC: 3.32 MIL/uL — ABNORMAL LOW (ref 3.87–5.11)
RDW: 13.3 % (ref 11.5–15.5)
WBC: 11.6 10*3/uL — ABNORMAL HIGH (ref 4.0–10.5)
nRBC: 0.3 % — ABNORMAL HIGH (ref 0.0–0.2)

## 2022-07-24 LAB — CULTURE, BLOOD (ROUTINE X 2)
Culture: NO GROWTH
Culture: NO GROWTH
Special Requests: ADEQUATE
Special Requests: ADEQUATE

## 2022-07-24 LAB — SURGICAL PATHOLOGY

## 2022-07-24 LAB — VITAMIN B12: Vitamin B-12: 314 pg/mL (ref 180–914)

## 2022-07-24 LAB — FOLATE: Folate: 6.3 ng/mL (ref >5.9)

## 2022-07-24 LAB — FERRITIN: Ferritin: 148 ng/mL (ref 11–307)

## 2022-07-24 MED ORDER — OXYCODONE HCL 5 MG PO TABS: 5.0000 mg | ORAL_TABLET | Freq: Four times a day (QID) | ORAL | 0 refills | Status: DC | PRN

## 2022-07-24 MED ORDER — ONDANSETRON 4 MG PO TBDP: 4.0000 mg | ORAL_TABLET | Freq: Four times a day (QID) | ORAL | 0 refills | Status: DC | PRN

## 2022-07-24 MED ORDER — AMOXICILLIN-POT CLAVULANATE 875-125 MG PO TABS: 1.0000 | ORAL_TABLET | Freq: Two times a day (BID) | ORAL | 0 refills | Status: AC

## 2022-07-24 MED ORDER — IBUPROFEN 600 MG PO TABS: 600.0000 mg | ORAL_TABLET | Freq: Four times a day (QID) | ORAL | 0 refills | Status: DC | PRN

## 2022-07-24 NOTE — Discharge Instructions (Addendum)
In addition to included general post-operative instructions,  Diet: Resume home diet.   Activity: No heavy lifting >20 pounds (children, pets, laundry, garbage) or strenuous activity for 4 weeks, but light activity and walking are encouraged. Do not drive or drink alcohol if taking narcotic pain medications or having pain that might distract from driving.  Wound care: You may shower/get incision wet with soapy water and pat dry (do not rub incisions), but no baths or submerging incision underwater until follow-up. We will remove staples in follow up. Leave clear dressing over your drain site in place for 48 hours. Okay to remove this on Friday morning. You can cover midline incision with dry gauze and tape if needed, otherwise this is okay to be open to the air.   Medications: Resume all home medications. For mild to moderate pain: acetaminophen (Tylenol) or ibuprofen/naproxen (if no kidney disease). Combining Tylenol with alcohol can substantially increase your risk of causing liver disease. Narcotic pain medications, if prescribed, can be used for severe pain, though may cause nausea, constipation, and drowsiness. Do not combine Tylenol and Percocet (or similar) within a 6 hour period as Percocet (and similar) contain(s) Tylenol. If you do not need the narcotic pain medication, you do not need to fill the prescription.  Call office [phone]) at any time if any questions, worsening pain, fevers/chills, bleeding, or other concerns.

## 2022-07-24 NOTE — Progress Notes (Signed)
Ambulated in hall. O2 sat 94%-95% on room air. No distress noted.

## 2022-07-24 NOTE — Discharge Summary (Signed)
[facility] SURGICAL DISCHARGE SUMMARY  Patient ID: Katherine Foster MRN: [id number] DOB/AGE: August 22, 1976 46 y.o.  Admit date: 07/19/2022 Discharge date: 07/24/2022  Discharge Diagnoses Patient Active Problem List   Diagnosis Date Noted   Pneumoperitoneum 07/19/2022   Bowel perforation ([place])    History of colonic polyps     Consultants None  Procedures 07/20/2022: Exploratory Laparotomy with open appendectomy and cecectomy  HPI: Katherine Foster is a 46 y.o. female presenting with a 1 day history of lower abdominal pain.  The patient had a colonoscopy yesterday with Dr. Marius Ditch for a history of colonic polyps. Her last colonoscopy prior to that was in 2022 and she had a hemostatic clip placed at the location of the polypectomy in the cecum.  There was a residual polyp found on yesterday colonoscopy and this was resected with a hot snare.  The patient reports that during the day yesterday, she was doing well but last night she started having more lower abdominal soreness and some distention.  The pain continued to progress and this morning she also felt chills and had a low grade temp of 100.9.  Denies any nausea or vomiting.  She presented to the ED and her workup showed a WBC of 16.3, Cr of 0.82, K 3.3, normal LFTs, and normal lactic acid of 1.2.  CT scan however, showed a small amount of pneumoperitoneum in the RUQ and anterior abdominal wall, with some fluid around the cecum.  Hospital Course: She was admitted to general surgery service and given her reassuring initial presentation/examination, conservative measure were attempted. However, on HD1 she continued to have worsening pain, tachycardia, and KUB was concerning for increasing pneumoperitoneum. Informed consent was obtained and documented, and patient underwent exploratory laparotomy and cecectomy (Dr Hampton Abbot, 07/20/2022).  Post-operatively, patient's did well. Advancement of patient's diet and ambulation were  well-tolerated. The remainder of patient's hospital course was essentially unremarkable, and discharge planning was initiated accordingly with patient safely able to be discharged home with appropriate discharge instructions, antibiotics (Augmentin x7 days), pain control, and outpatient follow-up after all of her questions were answered to her expressed satisfaction.   Discharge Condition: Good   Physical Examination:  Constitutional: alert, cooperative and no distress  Respiratory: breathing non-labored at rest  Cardiovascular: regular rate and sinus rhythm  Gastrointestinal: soft, incisional soreness, non-distended, no rebound/guarding. Surgical drain in right abdomen; serosanguinous (REMOVED) Integumentary: Laparotomy incision is CDI with staples, penrose removed   Allergies as of 07/24/2022       Reactions   Sulfa Antibiotics         Medication List     TAKE these medications    amoxicillin-clavulanate 875-125 MG tablet Commonly known as: AUGMENTIN Take 1 tablet by mouth 2 (two) times daily for 7 days.   atorvastatin 10 MG tablet Commonly known as: LIPITOR Take 1 tablet (10 mg total) by mouth daily.   citalopram 20 MG tablet Commonly known as: CELEXA TAKE 1 TABLET(20 MG) BY MOUTH DAILY   ibuprofen 600 MG tablet Commonly known as: ADVIL Take 1 tablet (600 mg total) by mouth every 6 (six) hours as needed.   losartan 100 MG tablet Commonly known as: COZAAR TAKE 1 TABLET(100 MG) BY MOUTH DAILY   ondansetron 4 MG disintegrating tablet Commonly known as: ZOFRAN-ODT Take 1 tablet (4 mg total) by mouth every 6 (six) hours as needed for nausea.   oxyCODONE 5 MG immediate release tablet Commonly known as: Oxy IR/ROXICODONE Take 1 tablet (5 mg total) by mouth every 6 (  six) hours as needed for severe pain or breakthrough pain.   rizatriptan 5 MG tablet Commonly known as: MAXALT Take 5 mg by mouth as needed for migraine. May repeat in 2 hours if needed   Vitamin D  (Ergocalciferol) 1.25 MG (50000 UNIT) Caps capsule Commonly known as: DRISDOL Take 1 capsule (50,000 Units total) by mouth every 7 (seven) days.          Follow-up Information     Olean Ree, MD. Go on 08/03/2022.   Specialty: General Surgery Why: Go to appointment on 10/06 at 1015 AM....s/p exploratory laparotomy, has staples, please make sure she has BMP drawn before appointment....can see Thedore Mins if needed Contact information: [address] [place] [address] [phone]                  Time spent on discharge management including discussion of hospital course, clinical condition, outpatient instructions, prescriptions, and follow up with the patient and members of the medical team: >30 minutes  -- Edison Simon , PA-C [facility]  07/24/2022, 8:22 AM [phone] M-F: 7am - 4pm

## 2022-07-24 NOTE — Telephone Encounter (Signed)
Transition Care Management Unsuccessful Follow-up Telephone Call  Date of discharge and from where:  [facility] 9/26  Attempts:  1st Attempt  Reason for unsuccessful TCM follow-up call:  Left voice message

## 2022-07-26 ENCOUNTER — Telehealth: Payer: Self-pay | Admitting: Surgery

## 2022-07-26 ENCOUNTER — Telehealth: Payer: Self-pay

## 2022-07-26 NOTE — Telephone Encounter (Signed)
Transition Care Management Follow-up Telephone Call Date of discharge and from where: [facility] 9/26 How have you been since you were released from the hospital? Has 'felt better' Any questions or concerns? No  Items Reviewed: Did the pt receive and understand the discharge instructions provided? Yes  Medications obtained and verified? Yes  Other? No  Any new allergies since your discharge? No  Dietary orders reviewed? No Do you have support at home? Yes     Functional Questionnaire: (I = Independent and D = Dependent) ADLs: I  Bathing/Dressing- I  Meal Prep- I  Eating- I  Maintaining continence- I  Transferring/Ambulation- I  Managing Meds- I  Follow up appointments reviewed:  PCP Hospital f/u appt confirmed? Yes  Scheduled to see Webb Silversmith on 10/11 @ [facility] f/u appt confirmed? Yes  Scheduled to see surgeon on 10/6  Are transportation arrangements needed? No  If their condition worsens, is the pt aware to call PCP or go to the Emergency Dept.? Yes Was the patient provided with contact information for the PCP's office or ED? Yes Was to pt encouraged to call back with questions or concerns? Yes

## 2022-07-26 NOTE — Telephone Encounter (Signed)
Patient had exploratory lap done on 07/20/22 with Dr. Hampton Abbot.  She has post op on 08/03/22 with him. She states is feeling really big and bloated still with cramping.  She takes her Oxycodone and wants to know if ok to take her anti nausea medication along with this?  Sometimes she eats and sometimes doesn't.  She also has a question about when she can remove her dressing.  Please call. Thank you.

## 2022-07-26 NOTE — Telephone Encounter (Signed)
Transition Care Management Unsuccessful Follow-up Telephone Call  Date of discharge and from where:  [facility] 9/26   Attempts:  2nd Attempt  Reason for unsuccessful TCM follow-up call:  Left voice message Asked her to call office and make appointment

## 2022-07-26 NOTE — Telephone Encounter (Signed)
noted 

## 2022-07-26 NOTE — Telephone Encounter (Signed)
Spoke with the patient about pain control and nausea. She may take the Zofran with her other medication. Also instructed about changing her dressing. She may also take Gas-X for any bloating. She will call back with any further questions.

## 2022-07-27 ENCOUNTER — Encounter: Payer: Self-pay | Admitting: Emergency Medicine

## 2022-07-27 ENCOUNTER — Emergency Department: Payer: BC Managed Care – PPO

## 2022-07-27 ENCOUNTER — Other Ambulatory Visit: Payer: Self-pay

## 2022-07-27 ENCOUNTER — Emergency Department
Admission: EM | Admit: 2022-07-27 | Discharge: 2022-07-27 | Disposition: A | Discharge status: Home / Self Care | Payer: BC Managed Care – PPO | Attending: Student in an Organized Health Care Education/Training Program | Admitting: Student in an Organized Health Care Education/Training Program

## 2022-07-27 DIAGNOSIS — R110 Nausea: Secondary | ICD-10-CM

## 2022-07-27 DIAGNOSIS — R5383 Other fatigue: Secondary | ICD-10-CM | POA: Insufficient documentation

## 2022-07-27 DIAGNOSIS — I10 Essential (primary) hypertension: Secondary | ICD-10-CM | POA: Diagnosis not present

## 2022-07-27 DIAGNOSIS — R1084 Generalized abdominal pain: Secondary | ICD-10-CM

## 2022-07-27 DIAGNOSIS — R109 Unspecified abdominal pain: Secondary | ICD-10-CM | POA: Diagnosis not present

## 2022-07-27 DIAGNOSIS — G8918 Other acute postprocedural pain: Secondary | ICD-10-CM | POA: Insufficient documentation

## 2022-07-27 DIAGNOSIS — R1030 Lower abdominal pain, unspecified: Secondary | ICD-10-CM | POA: Diagnosis not present

## 2022-07-27 LAB — CBC WITH DIFFERENTIAL/PLATELET
Abs Immature Granulocytes: 0.57 10*3/uL — ABNORMAL HIGH (ref 0.00–0.07)
Basophils Absolute: 0.1 10*3/uL (ref 0.0–0.1)
Basophils Relative: 0 %
Eosinophils Absolute: 0.5 10*3/uL (ref 0.0–0.5)
Eosinophils Relative: 4 %
HCT: 31.4 % — ABNORMAL LOW (ref 36.0–46.0)
Hemoglobin: 10 g/dL — ABNORMAL LOW (ref 12.0–15.0)
Immature Granulocytes: 5 %
Lymphocytes Relative: 17 %
Lymphs Abs: 2 10*3/uL (ref 0.7–4.0)
MCH: 28.5 pg (ref 26.0–34.0)
MCHC: 31.8 g/dL (ref 30.0–36.0)
MCV: 89.5 fL (ref 80.0–100.0)
Monocytes Absolute: 0.7 10*3/uL (ref 0.1–1.0)
Monocytes Relative: 6 %
Neutro Abs: 8.2 10*3/uL — ABNORMAL HIGH (ref 1.7–7.7)
Neutrophils Relative %: 68 %
Platelets: 250 10*3/uL (ref 150–400)
RBC: 3.51 MIL/uL — ABNORMAL LOW (ref 3.87–5.11)
RDW: 12.8 % (ref 11.5–15.5)
WBC: 12 10*3/uL — ABNORMAL HIGH (ref 4.0–10.5)
nRBC: 0 % (ref 0.0–0.2)

## 2022-07-27 LAB — COMPREHENSIVE METABOLIC PANEL
ALT: 31 U/L (ref 0–44)
AST: 23 U/L (ref 15–41)
Albumin: 3.1 g/dL — ABNORMAL LOW (ref 3.5–5.0)
Alkaline Phosphatase: 94 U/L (ref 38–126)
Anion gap: 8 (ref 5–15)
BUN: 12 mg/dL (ref 6–20)
CO2: 27 mmol/L (ref 22–32)
Calcium: 9.2 mg/dL (ref 8.9–10.3)
Chloride: 106 mmol/L (ref 98–111)
Creatinine, Ser: 1.02 mg/dL — ABNORMAL HIGH (ref 0.44–1.00)
GFR, Estimated: >60 mL/min (ref >60)
Glucose, Bld: 107 mg/dL — ABNORMAL HIGH (ref 70–99)
Potassium: 3.9 mmol/L (ref 3.5–5.1)
Sodium: 141 mmol/L (ref 135–145)
Total Bilirubin: 0.7 mg/dL (ref 0.3–1.2)
Total Protein: 7.1 g/dL (ref 6.5–8.1)

## 2022-07-27 LAB — LIPASE, BLOOD: Lipase: 36 U/L (ref 11–51)

## 2022-07-27 LAB — URINALYSIS, ROUTINE W REFLEX MICROSCOPIC
Bilirubin Urine: NEGATIVE
Glucose, UA: NEGATIVE mg/dL
Ketones, ur: NEGATIVE mg/dL
Nitrite: NEGATIVE
Protein, ur: NEGATIVE mg/dL
Specific Gravity, Urine: 1.008 (ref 1.005–1.030)
pH: 7 (ref 5.0–8.0)

## 2022-07-27 LAB — POC URINE PREG, ED: Preg Test, Ur: NEGATIVE

## 2022-07-27 LAB — LACTIC ACID, PLASMA: Lactic Acid, Venous: 0.9 mmol/L (ref 0.5–1.9)

## 2022-07-27 MED ORDER — MORPHINE SULFATE (PF) 4 MG/ML IV SOLN
4.0000 mg | INTRAVENOUS | Status: DC | PRN
  Administered 2022-07-27: 2 mg via INTRAVENOUS
  Filled 2022-07-27: qty 1

## 2022-07-27 MED ORDER — ONDANSETRON HCL 4 MG/2ML IJ SOLN
4.0000 mg | Freq: Once | INTRAMUSCULAR | Status: AC
  Administered 2022-07-27: 4 mg via INTRAVENOUS
  Filled 2022-07-27: qty 2

## 2022-07-27 MED ORDER — SODIUM CHLORIDE 0.9 % IV BOLUS
1000.0000 mL | Freq: Once | INTRAVENOUS | Status: AC
  Administered 2022-07-27: 1000 mL via INTRAVENOUS

## 2022-07-27 MED ORDER — TRAMADOL HCL 50 MG PO TABS: 50.0000 mg | ORAL_TABLET | Freq: Four times a day (QID) | ORAL | 0 refills | Status: DC | PRN

## 2022-07-27 MED ORDER — IOHEXOL 300 MG/ML  SOLN
100.0000 mL | Freq: Once | INTRAMUSCULAR | Status: AC | PRN
  Administered 2022-07-27: 100 mL via INTRAVENOUS

## 2022-07-27 NOTE — ED Provider Notes (Signed)
[facility] Provider Note    Event Date/Time   First MD Initiated Contact with Patient 07/27/22 0701     (approximate)   History   Post-op Problem   HPI  Katherine Foster is a 46 y.o. female with history of hypertension hyperlipidemia constipation presents to the ER roughly 1 week post exploratory laparotomy for bowel perforation with chief complaint of worsening generalized abdominal pain some drainage from her surgical site and generalized malaise.  Having trouble keeping any food down.  She is moving her bowels but feels like she is mildly constipated.  Is not having any fevers or chills.     Physical Exam   Triage Vital Signs: ED Triage Vitals  Enc Vitals Group     BP 07/27/22 0430 (!) 182/91     Pulse Rate 07/27/22 0430 69     Resp 07/27/22 0430 18     Temp 07/27/22 0430 98.3 F (36.8 C)     Temp Source 07/27/22 0430 Oral     SpO2 07/27/22 0430 95 %     Weight 07/27/22 0439 178 lb (80.7 kg)     Height 07/27/22 0439 '5\' 4"'$  (1.626 m)     Head Circumference --      Peak Flow --      Pain Score 07/27/22 0439 0     Pain Loc --      Pain Edu? --      Excl. in [place]? --     Most recent vital signs: Vitals:   07/27/22 0430 07/27/22 0719  BP: (!) 182/91 (!) 178/99  Pulse: 69 69  Resp: 18 18  Temp: 98.3 F (36.8 C) 98.6 F (37 C)  SpO2: 95% 95%     Constitutional: Alert  Eyes: Conjunctivae are normal.  Head: Atraumatic. Nose: No congestion/rhinnorhea. Mouth/Throat: Mucous membranes are moist.   Neck: Painless ROM.  Cardiovascular:   Good peripheral circulation. Respiratory: Normal respiratory effort.  No retractions.  Gastrointestinal: Soft with mild generalized tenderness to palpation no guarding or rebound.  Surgical incision appears to be appropriately healing there is a small amount of serous drainage in the inferior aspect of the incision without significant fluctuance or erythema. Musculoskeletal:  no deformity Neurologic:  MAE  spontaneously. No gross focal neurologic deficits are appreciated.  Skin:  Skin is warm, dry and intact. No rash noted. Psychiatric: Mood and affect are normal. Speech and behavior are normal.    ED Results / Procedures / Treatments   Labs (all labs ordered are listed, but only abnormal results are displayed) Labs Reviewed  CBC WITH DIFFERENTIAL/PLATELET - Abnormal; Notable for the following components:      Result Value   WBC 12.0 (*)    RBC 3.51 (*)    Hemoglobin 10.0 (*)    HCT 31.4 (*)    Neutro Abs 8.2 (*)    Abs Immature Granulocytes 0.57 (*)    All other components within normal limits  COMPREHENSIVE METABOLIC PANEL - Abnormal; Notable for the following components:   Glucose, Bld 107 (*)    Creatinine, Ser 1.02 (*)    Albumin 3.1 (*)    All other components within normal limits  URINALYSIS, ROUTINE W REFLEX MICROSCOPIC - Abnormal; Notable for the following components:   Color, Urine STRAW (*)    APPearance HAZY (*)    Hgb urine dipstick MODERATE (*)    Leukocytes,Ua SMALL (*)    Bacteria, UA RARE (*)    All other components within normal  limits  LIPASE, BLOOD  LACTIC ACID, PLASMA  POC URINE PREG, ED     EKG     RADIOLOGY Please see ED Course for my review and interpretation.  I personally reviewed all radiographic images ordered to evaluate for the above acute complaints and reviewed radiology reports and findings.  These findings were personally discussed with the patient.  Please see medical record for radiology report.    PROCEDURES:  Critical Care performed: no  Procedures   MEDICATIONS ORDERED IN ED: Medications  morphine (PF) 4 MG/ML injection 4 mg (2 mg Intravenous Given 07/27/22 0828)  sodium chloride 0.9 % bolus 1,000 mL (1,000 mLs Intravenous New Bag/Given 07/27/22 0827)  ondansetron (ZOFRAN) injection 4 mg (4 mg Intravenous Given 07/27/22 0828)  iohexol (OMNIPAQUE) 300 MG/ML solution 100 mL (100 mLs Intravenous Contrast Given 07/27/22 0836)      IMPRESSION / MDM / ASSESSMENT AND PLAN / ED COURSE  I reviewed the triage vital signs and the nursing notes.                              Differential diagnosis includes, but is not limited to, bowel perforation, abscess, ileus, SBO, appendicitis, diverticulitis, colitis, retained foreign body, dehydration, medication effect  Patient presenting to the ER for evaluation of symptoms as described above.  Base on symptoms, risk factors and considered above differential, this presenting complaint could reflect a potentially life-threatening illness therefore the patient will be placed on continuous pulse oximetry and telemetry for monitoring.  Laboratory evaluation will be sent to evaluate for the above complaints.  Patient be given IV fluids as well as IV morphine for pain and Zofran.  Based on her recent postop state I did consult with general surgery who will evaluate patient at bedside.  We will obtain CT imaging to evaluate for postoperative complication such as abscess and the above differential.    Clinical Course as of 07/27/22 1020  Fri Jul 27, 2022  0846 CT imaging on my review and interpretation without any perforation or abscess. [PR]  1019 Patient has been evaluate by surgery at bedside.  CT imaging reviewed with surgery as well as radiology not felt to reflect abscess at this time.  Patient feeling improved she has no lactate she is tolerating p.o.  States that she does feel very anxious and frustrated about everything has been going on.  We considered observation in the hospital for further monitoring but patient has close outpatient follow-up and feels comfortable going home. [PR]    Clinical Course User Index [PR] Merlyn Lot, MD     FINAL CLINICAL IMPRESSION(S) / ED DIAGNOSES   Final diagnoses:  Post-operative pain     Rx / DC Orders   ED Discharge Orders          Ordered    traMADol (ULTRAM) 50 MG tablet  Every 6 hours PRN        07/27/22 1007              Note:  This document was prepared using Dragon voice recognition software and may include unintentional dictation errors.    Merlyn Lot, MD 07/27/22 1020

## 2022-07-27 NOTE — ED Triage Notes (Signed)
Patient ambulatory to triage with steady gait, without difficulty or distress noted; pt reports exploratory lap last wk for perforation; pt reports drainage from incision site accomp by nausea; midline abd incision with staples intact; serosanguinous drainage noted to lower portion of incision

## 2022-07-27 NOTE — ED Notes (Signed)
Pt up to restroom with minimal assistance.

## 2022-07-27 NOTE — Discharge Instructions (Signed)
In addition to included general post-operative instructions,  Diet: Resume home diet.   Activity: No heavy lifting >20 pounds (children, pets, laundry, garbage) or strenuous activity for 6 weeks from date of surgery, but light activity and walking are encouraged. Do not drive or drink alcohol if taking narcotic pain medications or having pain that might distract from driving.  Wound care: Continue superficial dressing with gauze and tape to lower portion of your incision as needed. This will likely continue to drain for a few days, which is normal. We will see you in the office on 10/06 as scheduled for staple removal.   Medications:   - I will switch you to Tramadol from Oxycodone. You can take this medication every 4-6 hours as needed. As discussed, sometimes it helps to take this medication 30-60 minutes before bedtime  - Do NOT combine tramadol and oxycodone   - You can take Ibuprofen (up to 800 mg) every six hours as needed; do not exceed 3200 mg in a 24 hour period  - You can take tylenol (up to 1000 mg) every six hours as needed; do not exceed 4,000 mg in a 24 hour period  - Example: 6:00am - 800 mg Ibuprofen, 9:00am - 1000 mg Tylenol, 12:00pm - 800 mg Ibuprofen....  Call office [phone]) at any time if any questions, worsening pain, fevers/chills, bleeding, or other concerns.

## 2022-07-27 NOTE — Consult Note (Addendum)
[facility] SURGICAL CONSULTATION NOTE (initial) - cpt: [phone]   HISTORY OF PRESENT ILLNESS (HPI):  46 y.o. female presented to [facility] ED today for "feeling bad" since surgery. Patient well known to [place] following recent admission from 09/21 - 09/26 for iatrogenic colon perforation after colonoscopy requiring exploratory laparotomy with cecectomy and appendectomy on 09/22 with Dr Hampton Abbot. She ultimately did well and was discharged home on 09/26 with Augmentin x7 days. She reports that since being home, she overall feels "bad." She has a hard time elaborating on this. She reports somewhat generalized abdominal pain but this is worse in her lateral abdomen. She is using pain medications but does not like the way the oxycodone makes her feel. Additionally endorsing decreased appetite, nausea, and persistent drainage from the lower portion of her incision. No fever, chills, cough, CP, SOB, emesis, or bowel changes. She is continuing to pass flatus and have small BMs. Drainage from the wound is serosanguinous but she is frustrated that it is still draining. Work up in the ED revealed a mild, and stable, leukocytosis to 12.0K (11.8K --> 11.6K --> 12.0K), Hgb stable at 10.0 (9.5 at discharge), renal function back towards baseline with sCr - 1.02 (1.24 at discharge), no appreciable electrolyte derangements, and lactic acid level normal at 0.9. She did undergo CT Abdomen/Pelvis as well which was relatively reassuring, there is small amount of fluid in pelvis, no free air, no evidence of bowel obstruction nor ileus.    Surgery is consulted by emergency medicine physician Dr. Merlyn Lot, MD in this context for evaluation and management of her post-op presentation.  PAST MEDICAL HISTORY (PMH):  Past Medical History:  Diagnosis Date   Chronic cough    Constipation    Hyperlipidemia    Hypertension    Joint pain    Lower extremity edema    Palpitations    SOB (shortness of breath) on exertion       PAST SURGICAL HISTORY ([place]):  Past Surgical History:  Procedure Laterality Date   APPENDECTOMY N/A 07/20/2022   Procedure: Silverio Lay;  Surgeon: Olean Ree, MD;  Location: [facility] ORS;  Service: General;  Laterality: N/A;   BREAST BIOPSY Right 04/05/2020   [place] bx, coil marker, BENIGN MAMMARY PARENCHYMA   COLONOSCOPY WITH PROPOFOL N/A 07/18/2021   Procedure: COLONOSCOPY WITH PROPOFOL;  Surgeon: Virgel Manifold, MD;  Location: [facility] ENDOSCOPY;  Service: Endoscopy;  Laterality: N/A;   COLONOSCOPY WITH PROPOFOL N/A 07/18/2022   Procedure: COLONOSCOPY WITH PROPOFOL;  Surgeon: Lin Landsman, MD;  Location: [facility] ENDOSCOPY;  Service: Gastroenterology;  Laterality: N/A;   LAPAROTOMY N/A 07/20/2022   Procedure: EXPLORATORY LAPAROTOMY;  Surgeon: Olean Ree, MD;  Location: [facility] ORS;  Service: General;  Laterality: N/A;   LASIK  11/06/2019   WISDOM TOOTH EXTRACTION       MEDICATIONS:  Prior to Admission medications   Medication Sig Start Date End Date Taking? Authorizing Provider  amoxicillin-clavulanate (AUGMENTIN) 875-125 MG tablet Take 1 tablet by mouth 2 (two) times daily for 7 days. 07/24/22 07/31/22  Tylene Fantasia, PA-C  atorvastatin (LIPITOR) 10 MG tablet Take 1 tablet (10 mg total) by mouth daily. 06/08/22   Jearld Fenton, NP  citalopram (CELEXA) 20 MG tablet TAKE 1 TABLET(20 MG) BY MOUTH DAILY 03/28/22   Jearld Fenton, NP  ibuprofen (ADVIL) 600 MG tablet Take 1 tablet (600 mg total) by mouth every 6 (six) hours as needed. 07/24/22   Tylene Fantasia, PA-C  losartan (COZAAR) 100 MG  tablet TAKE 1 TABLET(100 MG) BY MOUTH DAILY 02/27/22   Jearld Fenton, NP  ondansetron (ZOFRAN-ODT) 4 MG disintegrating tablet Take 1 tablet (4 mg total) by mouth every 6 (six) hours as needed for nausea. 07/24/22   Tylene Fantasia, PA-C  oxyCODONE (OXY IR/ROXICODONE) 5 MG immediate release tablet Take 1 tablet (5 mg total) by mouth every 6 (six) hours as needed for severe pain or  breakthrough pain. 07/24/22   Tylene Fantasia, PA-C  rizatriptan (MAXALT) 5 MG tablet Take 5 mg by mouth as needed for migraine. May repeat in 2 hours if needed    [provider]  Vitamin D, Ergocalciferol, (DRISDOL) 1.25 MG (50000 UNIT) CAPS capsule Take 1 capsule (50,000 Units total) by mouth every 7 (seven) days. 06/12/22   Jearld Fenton, NP     ALLERGIES:  Allergies  Allergen Reactions   Sulfa Antibiotics      SOCIAL HISTORY:  Social History   Socioeconomic History   Marital status: Married    Spouse name: Not on file   Number of children: Not on file   Years of education: Not on file   Highest education level: Not on file  Occupational History   Occupation: Industrial/product designer  Tobacco Use   Smoking status: Never   Smokeless tobacco: Never  Vaping Use   Vaping Use: Never used  Substance and Sexual Activity   Alcohol use: Yes    Comment: occ   Drug use: Never   Sexual activity: Not on file  Other Topics Concern   Not on file  Social History Narrative   Not on file   Social Determinants of Health   Financial Resource Strain: Not on file  Food Insecurity: Not on file  Transportation Needs: Not on file  Physical Activity: Not on file  Stress: Not on file  Social Connections: Not on file  Intimate Partner Violence: Not on file     FAMILY HISTORY:  Family History  Problem Relation Age of Onset   Heart disease Mother    Hypertension Mother    Diabetes Mellitus II Mother    Hypothyroidism Mother    Anxiety disorder Mother    Liver disease Mother    Obesity Mother    Obesity Father    Thyroid disease Father    Hyperlipidemia Father    Hypertension Father    Heart disease Father    Heart attack Father    Diabetes Mellitus II Father    Sudden death Father    Breast cancer Maternal Grandmother    Cancer Maternal Grandmother    Diabetes Maternal Grandfather    Heart attack Paternal Grandfather    Colon cancer Neg  Hx       REVIEW OF SYSTEMS:  Review of Systems  Constitutional:  Positive for malaise/fatigue. Negative for chills and fever.       + Decreased Appetite   HENT:  Negative for congestion and sore throat.   Respiratory:  Negative for cough and shortness of breath.   Cardiovascular:  Negative for chest pain and palpitations.  Gastrointestinal:  Positive for abdominal pain and nausea. Negative for blood in stool, constipation, diarrhea and vomiting.  Genitourinary:  Negative for dysuria and urgency.  All other systems reviewed and are negative.   VITAL SIGNS:  Temp:  [98.3 F (36.8 C)-98.6 F (37 C)] 98.6 F (37 C) (09/29 0719) Pulse Rate:  [69] 69 (09/29 0719) Resp:  [18] 18 (09/29 0719) BP: (178-182)/(91-99)  178/99 (09/29 0719) SpO2:  [95 %] 95 % (09/29 0719) Weight:  [80.7 kg] 80.7 kg (09/29 0439)     Height: '5\' 4"'$  (162.6 cm) Weight: 80.7 kg BMI (Calculated): 30.54   INTAKE/OUTPUT:  No intake/output data recorded.  PHYSICAL EXAM:  Physical Exam Vitals and nursing note reviewed. Exam conducted with a chaperone present.  Constitutional:      General: She is not in acute distress.    Appearance: Normal appearance. She is not ill-appearing.  HENT:     Head: Normocephalic and atraumatic.  Eyes:     General: No scleral icterus.    Conjunctiva/sclera: Conjunctivae normal.  Cardiovascular:     Rate and Rhythm: Normal rate.     Pulses: Normal pulses.     Heart sounds: No murmur heard. Pulmonary:     Effort: Pulmonary effort is normal. No respiratory distress.     Breath sounds: Normal breath sounds.  Abdominal:     General: A surgical scar is present. There is no distension.     Palpations: Abdomen is soft.     Tenderness: There is generalized abdominal tenderness (Mild). There is no guarding or rebound.     Comments: Abdomen is soft, she is generally sore without localized tenderness, non-distended, no rebound/guarding. Exam is not overtly concerning for peritonitis    Genitourinary:    Comments: Deferred Musculoskeletal:        General: No tenderness.     Right lower leg: No edema.     Left lower leg: No edema.  Skin:    General: Skin is warm and dry.     Comments: Midline laparotomy is intact with staples, very mild erythema around only the staples themselves which is expected, there is no tracking erythema. There is a small (2-3 mm) opening in the dependent portion of the incision which also is expected, given previous placement of penrose drain in this area. There is a scant amount of serosanguinous drainage here. I did probe this with a q-tip and did not reveal any undrained collections or additional fluid. There is no evidence of abscess.   Neurological:     General: No focal deficit present.     Mental Status: She is alert and oriented to person, place, and time.  Psychiatric:        Mood and Affect: Mood is anxious.        Behavior: Behavior normal.     Comments: She is very anxious       Labs:     Latest Ref Rng & Units 07/27/2022    4:32 AM 07/24/2022    5:49 AM 07/23/2022    5:33 AM  CBC  WBC 4.0 - 10.5 K/uL 12.0  11.6  11.8   Hemoglobin 12.0 - 15.0 g/dL 10.0  9.5  10.1   Hematocrit 36.0 - 46.0 % 31.4  29.9  32.3   Platelets 150 - 400 K/uL 250  233  241       Latest Ref Rng & Units 07/27/2022    4:32 AM 07/24/2022    5:49 AM 07/23/2022    5:33 AM  CMP  Glucose 70 - 99 mg/dL 107  88  85   BUN 6 - 20 mg/dL '12  17  27   '$ Creatinine 0.44 - 1.00 mg/dL 1.02  1.24  1.50   Sodium 135 - 145 mmol/L 141  140  141   Potassium 3.5 - 5.1 mmol/L 3.9  3.8  4.2   Chloride 98 - 111  mmol/L 106  113  112   CO2 22 - 32 mmol/L '27  22  25   '$ Calcium 8.9 - 10.3 mg/dL 9.2  8.3  8.6   Total Protein 6.5 - 8.1 g/dL 7.1     Total Bilirubin 0.3 - 1.2 mg/dL 0.7     Alkaline Phos 38 - 126 U/L 94     AST 15 - 41 U/L 23     ALT 0 - 44 U/L 31        Imaging studies:   CT Abdomen/Pelvis (07/27/2022) personally reviewed which shows no evidence of free air,  no gross abscess but there is a small amount of free fluid in pelvis, no bowel dilation to suggest obstruction or ileus, and radiologist report reviewed below:  IMPRESSION: 1. Recent postoperative changes to the abdomen. Ventral abdominal incision with no adverse features aside from possible tiny (< 1 mL) postoperative fluid collection at with the ventral peritoneum on series 2, image 73. Larger volume of mildly complex fluid in the pelvis/cul-de-sac on the right. Early rim enhancement there such that developing abscess is not excluded (approximately 15 mL). Postoperative changes to the cecum with no adverse features.   2. Heterogeneous enhancement of both kidneys, suspicious for Early Bilateral Pyelonephritis. Recommend correlation with urinalysis. No obstructive uropathy.   3. Trace new layering pleural effusions and bilateral lung base atelectasis.   Assessment/Plan: 46 y.o. female presenting with generalized symptoms, nausea, abdominal pain 7 days s/p exploratory laparotomy, cecectomy, and appendectomy for iatrogenic colon perforation.    - Reviewed CT scan and discussed with interventional radiologist. There is small amount of free fluid in pelvis, this is likely non-specific, abscess of course possible but not likely. Clinically, she has a very stable and mild leukocytosis over the last 3-4 days without fever which is likely reactive stress response to surgery. I do not think she warrants any drainage nor aspiration at this time. Should her clinical condition worsen, develop fevers, or worsening leukocytosis can consider repeat CT.   - No evidence of ileus nor obstruction on imaging. Okay to continue regular diet at home as tolerated.   - Renal function improving; back towards baseline; receiving IVF in ED  - Midline wound still with expected drainage from dependent portion. No evidence of infection or undrained abscess. She should continue superficial dressings as needed. I did my best to  provide her with reassurance.   - Complete current course of Abx  - Pain control prn; she does not want to take the oxycodone, which is reasonable, I will send her a few tramadol. She was encouraged to continue tylenol, ibuprofen, +/- ice packs at home. I put detailed instruction in her DC information.   - She has antiemetics for home.   - I do not see a reason for admission at this time. She has follow up on 10/06. She understands to call the office with questions or concerns.   All of the above findings and recommendations were discussed with the patient and her family at bedside, and all of patient's and her family's questions were answered to their expressed satisfaction.  Thank you for the opportunity to participate in this patient's care.   -- Edison Simon, PA-C [facility] 07/27/2022, 7:25 AM M-F: 7am - 4pm

## 2022-08-02 ENCOUNTER — Telehealth: Payer: Self-pay

## 2022-08-02 NOTE — Telephone Encounter (Signed)
-----   Message from Lin Landsman, MD sent at 08/01/2022  4:53 PM EDT ----- Regarding: Telephone call Katherine Foster  Can you please call the patient sometime tomorrow when you get a chance to check on how she is doing since the surgery?  Thanks  RV

## 2022-08-02 NOTE — Telephone Encounter (Signed)
Patient states it is the most painful and uncomfortable thing she has ever been through. She states she still has not been able to go back to work. She states she follow up with general surgery tomorrow for a follow up and to get her staples removed. She states the area is still draining and she is still having some abdominal pain. She finished her antibiotics up yesterday. She states she is finally getting her appetite back. She did ask if she could have a copy of the consent she signed in the endo unit before her procedure. Informed patient that the ENDO unit gave her that consent we did not so she will need to call them to see if they can give her a copy of it. She states she will call them and request this.

## 2022-08-03 ENCOUNTER — Ambulatory Visit (INDEPENDENT_AMBULATORY_CARE_PROVIDER_SITE_OTHER): Payer: BC Managed Care – PPO | Admitting: Surgery

## 2022-08-03 ENCOUNTER — Encounter: Payer: Self-pay | Admitting: Surgery

## 2022-08-03 VITALS — BP 145/93 | HR 99 | Temp 98.0°F | Ht 64.0 in | Wt 167.0 lb

## 2022-08-03 DIAGNOSIS — Z09 Encounter for follow-up examination after completed treatment for conditions other than malignant neoplasm: Secondary | ICD-10-CM

## 2022-08-03 DIAGNOSIS — K631 Perforation of intestine (nontraumatic): Secondary | ICD-10-CM

## 2022-08-03 DIAGNOSIS — K3532 Acute appendicitis with perforation and localized peritonitis, without abscess: Secondary | ICD-10-CM

## 2022-08-03 NOTE — Patient Instructions (Addendum)
Continue to change your bottom dressing as needed and at least once a day. You may shower, do not scrub at your wounds. Remove your gauze dressing before you shower. Let the warm soapy water run over the area, rinse well, and pat dry and redress.  The loose stool should improve with time.   Follow up here in 2-3 weeks.   GENERAL POST-OPERATIVE PATIENT INSTRUCTIONS   WOUND CARE INSTRUCTIONS:   Once the wound has quit draining you may leave it open to air.  If clothing rubs against the wound or causes irritation and the wound is not draining you may cover it with a dry dressing during the daytime.  Try to keep the wound dry and avoid ointments on the wound unless directed to do so.  If the wound becomes bright red and painful or starts to drain infected material that is not clear, please contact your physician immediately.  If the wound is mildly pink and has a thick firm ridge underneath it, this is normal, and is referred to as a healing ridge.  This will resolve over the next 4-6 weeks.  BATHING: You may shower if you have been informed of this by your surgeon. However, Please do not submerge in a tub, hot tub, or pool until incisions are completely sealed or have been told by your surgeon that you may do so.  DIET:  You may eat any foods that you can tolerate.  It is a good idea to eat a high fiber diet and take in plenty of fluids to prevent constipation.  If you do become constipated you may want to take a mild laxative or take ducolax tablets on a daily basis until your bowel habits are regular.  Constipation can be very uncomfortable, along with straining, after recent surgery.  ACTIVITY: You should not lift more than 20 pounds for 6 weeks total after surgery as it could put you at increased risk for complications.  Twenty pounds is roughly equivalent to a plastic bag of groceries. At that time- Listen to your body when lifting, if you have pain when lifting, stop and then try again in a few  days. Soreness after doing exercises or activities of daily living is normal as you get back in to your normal routine.  MEDICATIONS:  Try to take narcotic medications and anti-inflammatory medications, such as tylenol, ibuprofen, naprosyn, etc., with food.  This will minimize stomach upset from the medication.  Should you develop nausea and vomiting from the pain medication, or develop a rash, please discontinue the medication and contact your physician.  You should not drive, make important decisions, or operate machinery when taking narcotic pain medication.  SUNBLOCK Use sun block to incision area over the next year if this area will be exposed to sun. This helps decrease scarring and will allow you avoid a permanent darkened area over your incision.  QUESTIONS:  Please feel free to call our office if you have any questions, and we will be glad to assist you. [phone]

## 2022-08-03 NOTE — Progress Notes (Signed)
08/03/2022  HPI: Katherine Foster is a 46 y.o. female s/p exploratory laparotomy with appendectomy and partial cecectomy on 07/20/2022 for a cecal perforation.  Patient presents today for follow-up.  When she was discharged, her JP drain and Penrose drain were removed.  She presented to emergency room on 07/27/2022 with postoperative abdominal pain and CT scan was obtained which did not have any acute issues.  Today, the patient reports that she is feeling much better and the pain has been improving.  Still reports some fatigue but her appetite and energy are starting to increase.  Denies any nausea or vomiting.  Reports having daily bowel movements although a little bit loose still.  Vital signs: BP (!) 145/93   Pulse 99   Temp 98 F (36.7 C)   Ht '5\' 4"'$  (1.626 m)   Wt 167 lb (75.8 kg)   LMP 07/20/2022 (Exact Date)   SpO2 97%   BMI 28.67 kg/m    Physical Exam: Constitutional: No acute distress Abdomen: Soft, nondistended, appropriately sore to palpation.  Midline incision is clean and intact with staples in place.  Inferiorly, the Penrose exit site still open with some serosanguineous drainage.  This was probed with a Q-tip and there is no evidence of infection or purulence.  Staples were removed and changed by Steri-Strips.  Assessment/Plan: This is a 46 y.o. female s/p exploratory laparotomy with appendectomy and partial cecectomy.  - Patient is currently doing better and I discussed with the patient that after this type of surgery, it is not uncommon to have still some discomfort as well as decreased energy level and appetite.  This will continue to improve slowly as she continues to heal.  At this point, there is no evidence of any complications.  Staples were removed.  Dry gauze dressing applied at the inferior portion of the wound where the Penrose drain exit site was located. - Patient will follow-up with [place] in about 3 weeks to check on her progress.   Melvyn Neth, [facility]

## 2022-08-08 ENCOUNTER — Encounter: Payer: Self-pay | Admitting: Internal Medicine

## 2022-08-08 ENCOUNTER — Ambulatory Visit: Payer: BC Managed Care – PPO | Admitting: Internal Medicine

## 2022-08-08 VITALS — BP 126/82 | HR 90 | Temp 96.9°F | Wt 168.0 lb

## 2022-08-08 DIAGNOSIS — Z9889 Other specified postprocedural states: Secondary | ICD-10-CM | POA: Diagnosis not present

## 2022-08-08 DIAGNOSIS — K668 Other specified disorders of peritoneum: Secondary | ICD-10-CM

## 2022-08-08 DIAGNOSIS — L299 Pruritus, unspecified: Secondary | ICD-10-CM

## 2022-08-08 DIAGNOSIS — K631 Perforation of intestine (nontraumatic): Secondary | ICD-10-CM

## 2022-08-08 DIAGNOSIS — R454 Irritability and anger: Secondary | ICD-10-CM | POA: Diagnosis not present

## 2022-08-08 DIAGNOSIS — F32A Depression, unspecified: Secondary | ICD-10-CM

## 2022-08-08 DIAGNOSIS — F419 Anxiety disorder, unspecified: Secondary | ICD-10-CM

## 2022-08-08 MED ORDER — CITALOPRAM HYDROBROMIDE 20 MG PO TABS: 40.0000 mg | ORAL_TABLET | Freq: Every day | ORAL | 1 refills | Status: DC

## 2022-08-08 NOTE — Progress Notes (Signed)
Subjective:    Patient ID: Katherine Foster, female    DOB: July 23, 1976, 46 y.o.   MRN: [id number]  HPI  Patient presents to clinic today for TCM hospital follow-up.  She presented to the ER 9/21 with complaint of fever, lower abdominal pain and distention.  She had had a colonoscopy the day prior by Dr. Marius Ditch.  Labs were remarkable for WBC of 16.3.  CT abdomen/pelvis showed:  IMPRESSION: 1. Scattered free air in the right anterior abdomen and right upper quadrant, typically seen with perforated viscus. There is a small amount of free fluid adjacent to the cecum with adjacent wall thickening, which could represent site of perforation related to recent colonoscopy. Metallic density within the lateral cecum/ascending colon is likely a clip, this clip is not in the region of the wall thickening. 2. No abscess. 3. Hepatic steatosis.  General surgery was consulted and they felt like she could be treated without surgical intervention.  The next day, she developed worsening abdominal pain.  KUB showed:  IMPRESSION: Pneumoperitoneum, suspect increased compared to yesterday's CT exam. Increased bibasilar atelectasis.  She went to the OR 9/22 for an exploratory laparotomy with cecetomy.  She was monitored and ultimately discharged on 9/26 with oral Augmentin.  She subsequently presented back to the ER 9/29 with complaint of abdominal pain and vomiting.  CT abdomen/pelvis showed:  IMPRESSION: 1. Recent postoperative changes to the abdomen. Ventral abdominal incision with no adverse features aside from possible tiny (< 1 mL) postoperative fluid collection at with the ventral peritoneum on series 2, image 73. Larger volume of mildly complex fluid in the pelvis/cul-de-sac on the right. Early rim enhancement there such that developing abscess is not excluded (approximately 15 mL). Postoperative changes to the cecum with no adverse features. 2. Heterogeneous enhancement of both kidneys, suspicious for Early  Bilateral Pyelonephritis. Recommend correlation with urinalysis. No obstructive uropathy. 3. Trace new layering pleural effusions and bilateral lung base atelectasis.   She was not treated for urinary tract infection.  No urine culture was sent.  She was treated for postoperative pain with a prescription for Tramadol.  She was discharged and advised to follow-up with her PCP and general surgery.  She subsequently saw general surgery 10/6.  He felt that she was healing well and removed her staples.  Since that time, she has noted that she feels more depressed. She is not feeling anxious. She denies SI/HI.  She also reports itching of her palms and forearms.  She reports that she scratches this area, red bumps will appear.  She has had itching of her feet but has not noticed a rash there.  She denies changes in soaps, lotions or detergents.  She is unsure if this is a delayed reaction from something she took in the hospital, possibly Tramadol.  She is taking Benadryl at bedtime with minimal relief of symptoms.  Past Medical History:  Diagnosis Date   Chronic cough    Constipation    Hyperlipidemia    Hypertension    Joint pain    Lower extremity edema    Palpitations    SOB (shortness of breath) on exertion     Current Outpatient Medications  Medication Sig Dispense Refill   atorvastatin (LIPITOR) 10 MG tablet Take 1 tablet (10 mg total) by mouth daily. 90 tablet 1   citalopram (CELEXA) 20 MG tablet TAKE 1 TABLET(20 MG) BY MOUTH DAILY 90 tablet 1   ibuprofen (ADVIL) 600 MG tablet Take 1 tablet (600 mg total)  by mouth every 6 (six) hours as needed. 30 tablet 0   losartan (COZAAR) 100 MG tablet TAKE 1 TABLET(100 MG) BY MOUTH DAILY 90 tablet 1   ondansetron (ZOFRAN-ODT) 4 MG disintegrating tablet Take 1 tablet (4 mg total) by mouth every 6 (six) hours as needed for nausea. 20 tablet 0   rizatriptan (MAXALT) 5 MG tablet Take 5 mg by mouth as needed for migraine. May repeat in 2 hours if needed      traMADol (ULTRAM) 50 MG tablet Take 1 tablet (50 mg total) by mouth every 6 (six) hours as needed for severe pain. (Patient not taking: Reported on 08/03/2022) 30 tablet 0   Vitamin D, Ergocalciferol, (DRISDOL) 1.25 MG (50000 UNIT) CAPS capsule Take 1 capsule (50,000 Units total) by mouth every 7 (seven) days. 12 capsule 0   No current facility-administered medications for this visit.    Allergies  Allergen Reactions   Sulfa Antibiotics     Family History  Problem Relation Age of Onset   Heart disease Mother    Hypertension Mother    Diabetes Mellitus II Mother    Hypothyroidism Mother    Anxiety disorder Mother    Liver disease Mother    Obesity Mother    Obesity Father    Thyroid disease Father    Hyperlipidemia Father    Hypertension Father    Heart disease Father    Heart attack Father    Diabetes Mellitus II Father    Sudden death Father    Breast cancer Maternal Grandmother    Cancer Maternal Grandmother    Diabetes Maternal Grandfather    Heart attack Paternal Grandfather    Colon cancer Neg Hx     Social History   Socioeconomic History   Marital status: Married    Spouse name: Not on file   Number of children: Not on file   Years of education: Not on file   Highest education level: Not on file  Occupational History   Occupation: Industrial/product designer  Tobacco Use   Smoking status: Never   Smokeless tobacco: Never  Vaping Use   Vaping Use: Never used  Substance and Sexual Activity   Alcohol use: Yes    Comment: occ   Drug use: Never   Sexual activity: Not on file  Other Topics Concern   Not on file  Social History Narrative   Not on file   Social Determinants of Health   Financial Resource Strain: Not on file  Food Insecurity: Not on file  Transportation Needs: Not on file  Physical Activity: Not on file  Stress: Not on file  Social Connections: Not on file  Intimate Partner Violence: Not on file      Constitutional: Denies fever, malaise, fatigue, headache or abrupt weight changes.  Respiratory: Denies difficulty breathing, shortness of breath, cough or sputum production.   Cardiovascular: Denies chest pain, chest tightness, palpitations or swelling in the hands or feet.  Gastrointestinal: Denies abdominal pain, bloating, constipation, diarrhea or blood in the stool.  Skin: Pt reports incision to abdomen, itching of palms and forearms.. Denies redness, rashes, lesions or ulcercations.  Neurological: Denies dizziness, difficulty with memory, difficulty with speech or problems with balance and coordination.  Psych: Pt reports depression. Denies anxiety, SI/HI.  No other specific complaints in a complete review of systems (except as listed in HPI above).     Objective:   Physical Exam  BP 126/82 (BP Location: Left Arm, Patient Position: Sitting,  Cuff Size: Normal)   Pulse 90   Temp (!) 96.9 F (36.1 C) (Temporal)   Wt 168 lb (76.2 kg)   LMP 07/20/2022 (Exact Date)   SpO2 98%   BMI 28.84 kg/m   Wt Readings from Last 3 Encounters:  08/03/22 167 lb (75.8 kg)  07/27/22 178 lb (80.7 kg)  07/20/22 174 lb 2.6 oz (79 kg)    General: Appears her stated age, overweight, in NAD. Skin: Warm, dry and intact. Large vertical incision noted over abdomen. No rashes noted. Cardiovascular: Normal rate and rhythm. S1,S2 noted.  No murmur, rubs or gallops noted.  Pulmonary/Chest: Normal effort and positive vesicular breath sounds. No respiratory distress. No wheezes, rales or ronchi noted.  Abdomen: Soft and generally tender. Normal bowel sounds.  Musculoskeletal: Normal range of motion. No signs of joint swelling. No difficulty with gait.  Neurological: Alert and oriented. Cranial nerves II-XII grossly intact. Coordination normal.  Psychiatric: Tearful.  Judgment and thought content normal.    BMET    Component Value Date/Time   NA 141 07/27/2022 0432   NA 140 02/15/2022 0951   K  3.9 07/27/2022 0432   CL 106 07/27/2022 0432   CO2 27 07/27/2022 0432   GLUCOSE 107 (H) 07/27/2022 0432   BUN 12 07/27/2022 0432   BUN 9 02/15/2022 0951   CREATININE 1.02 (H) 07/27/2022 0432   CREATININE 0.91 06/07/2022 0909   CALCIUM 9.2 07/27/2022 0432   GFRNONAA >60 07/27/2022 0432   GFRNONAA 84 12/29/2019 1031   GFRAA 97 12/29/2019 1031    Lipid Panel     Component Value Date/Time   CHOL 203 (H) 06/07/2022 0909   TRIG 203 (H) 06/07/2022 0909   HDL 36 (L) 06/07/2022 0909   CHOLHDL 5.6 (H) 06/07/2022 0909   LDLCALC 133 (H) 06/07/2022 0909    CBC    Component Value Date/Time   WBC 12.0 (H) 07/27/2022 0432   RBC 3.51 (L) 07/27/2022 0432   HGB 10.0 (L) 07/27/2022 0432   HCT 31.4 (L) 07/27/2022 0432   PLT 250 07/27/2022 0432   MCV 89.5 07/27/2022 0432   MCH 28.5 07/27/2022 0432   MCHC 31.8 07/27/2022 0432   RDW 12.8 07/27/2022 0432   LYMPHSABS 2.0 07/27/2022 0432   MONOABS 0.7 07/27/2022 0432   EOSABS 0.5 07/27/2022 0432   BASOSABS 0.1 07/27/2022 0432    Hgb A1C Lab Results  Component Value Date   HGBA1C 5.4 06/07/2022           Assessment & Plan:   [facility] Follow-up for Colon Perforation with Pneumoperitoneum status post Colonoscopy  Hospital/ER notes, labs and imaging reviewed She will continue current medications at this time She will follow-up with general surgery as previously scheduled Return to work note provided  Pr[place]:  Continue Benadryl at bedtime Add Zyrtec 10 mg and Famotidine 10 mg daily Let me know if symptoms persist or worsen  RTC in 4 months for follow-up of chronic conditions Webb Silversmith, NP

## 2022-08-08 NOTE — Assessment & Plan Note (Signed)
Deteriorated Increase citalopram to 40 mg daily Support offered

## 2022-08-08 NOTE — Patient Instructions (Signed)
Major Depressive Disorder, Adult Major depressive disorder is a mental health condition. This disorder affects feelings. It can also affect the body. Symptoms of this condition last most of the day, almost every day, for 2 weeks. This disorder can affect: Relationships. Daily activities, such as work and school. Activities that you normally like to do. What are the causes? The cause of this condition is not known. The disorder is likely caused by a mix of things, including: Your personality, such as being a shy person. Your behavior, or how you act toward others. Your thoughts and feelings. Too much alcohol or drugs. How you react to stress. Health and mental problems that you have had for a long time. Things that hurt you in the past (trauma). Big changes in your life, such as divorce. What increases the risk? The following factors may make you more likely to develop this condition: Having family members with depression. Being a woman. Problems in the family. Low levels of some brain chemicals. Things that caused you pain as a child, especially if you lost a parent or were abused. A lot of stress in your life, such as from: Living without basic needs of life, such as food and shelter. Being treated poorly because of race, sex, or religion (discrimination). Health and mental problems that you have had for a long time. What are the signs or symptoms? The main symptoms of this condition are: Being sad all the time. Being grouchy all the time. Loss of interest in things and activities. Other symptoms include: Sleeping too much or too little. Eating too much or too little. Gaining or losing weight, without knowing why. Feeling tired or having low energy. Being restless and weak. Feeling hopeless, worthless, or guilty. Trouble thinking clearly or making decisions. Thoughts of hurting yourself or others, or thoughts of ending your life. Spending a lot of time alone. Inability to  complete common tasks of daily life. If you have very bad MDD, you may: Believe things that are not true. Hear, see, taste, or feel things that are not there. Have mild depression that lasts for at least 2 years. Feel very sad and hopeless. Have trouble speaking or moving. How is this treated? This condition may be treated with: Talk therapy. This teaches you to know bad thoughts, feelings, and actions and how to change them. This can also help you to communicate with others. This can be done with members of your family. Medicines. These can be used to treat worry (anxiety), depression, or low levels of chemicals in the brain. Lifestyle changes. You may need to: Limit alcohol use. Limit drug use. Get regular exercise. Get plenty of sleep. Make healthy eating choices. Spend more time outdoors. Brain stimulation. This treatment excites the brain. This is done when symptoms are very bad or have not gotten better with other treatments. Follow these instructions at home: Activity Get regular exercise as told. Spend time outdoors as told. Make time to do the things you enjoy. Find ways to deal with stress. Try to: Meditate. Do deep breathing. Spend time in nature. Keep a journal. Return to your normal activities as told by your doctor. Ask your doctor what activities are safe for you. Alcohol and drug use If you drink alcohol: Limit how much you use to: 0-1 drink a day for women. 0-2 drinks a day for men. Be aware of how much alcohol is in your drink. In the [place] one drink equals one 12 oz bottle of beer (355 mL),   one 5 oz glass of wine (148 mL), or one 1 oz glass of hard liquor (44 mL). Talk to your doctor about: Alcohol use. Alcohol can affect some medicines. Any drug use. General instructions  Take over-the-counter and prescription medicines and herbal preparations only as told by your doctor. Eat a healthy diet. Get a lot of sleep. Think about joining a support group.  Your doctor may be able to suggest one. Keep all follow-up visits as told by your doctor. This is important. Where to find more information: [employer] on Mental Illness: www.nami.[place]: https://carter.com/ American Psychiatric Association: www.psychiatry.org/patients-families/ Contact a doctor if: Your symptoms get worse. You get new symptoms. Get help right away if: You hurt yourself. You have serious thoughts about hurting yourself or others. You see, hear, taste, smell, or feel things that are not there. If you ever feel like you may hurt yourself or others, or have thoughts about taking your own life, get help right away. Go to your nearest emergency department or: Call your local emergency services (911 in the [place] Call a suicide crisis helpline, such as the [place] at (814)170-3083 or 988 in the [place]. This is open 24 hours a day in the [place] Text the Crisis Text Line at 940-818-8208 (in the [place].). Summary Major depressive disorder is a mental health condition. This disorder affects feelings. Symptoms of this condition last most of the day, almost every day, for 2 weeks. The symptoms of this disorder can cause problems with relationships and with daily activities. There are treatments and support for people who get this disorder. You may need more than one type of treatment. Get help right away if you have serious thoughts about hurting yourself or others. This information is not intended to replace advice given to you by your health care provider. Make sure you discuss any questions you have with your health care provider. Document Revised: 05/10/2021 Document Reviewed: 09/26/2019 Elsevier Patient Education  [place].

## 2022-08-13 NOTE — Addendum Note (Signed)
Addended by: Jearld Fenton on: 08/13/2022 09:44 AM   Modules accepted: Level of Service

## 2022-08-14 NOTE — Telephone Encounter (Signed)
Called patient to check on her, she is recovering well from surgery. She has got back to work, half a day since last week, energy levels are improving, stools are soft, appetite is good. Patient appreciated my phone call to know about her condition  Katherine Sear, MD

## 2022-08-16 ENCOUNTER — Ambulatory Visit (INDEPENDENT_AMBULATORY_CARE_PROVIDER_SITE_OTHER): Payer: BC Managed Care – PPO | Admitting: Physician Assistant

## 2022-08-16 ENCOUNTER — Encounter: Payer: Self-pay | Admitting: Physician Assistant

## 2022-08-16 ENCOUNTER — Other Ambulatory Visit: Payer: Self-pay

## 2022-08-16 VITALS — BP 133/78 | HR 80 | Temp 99.2°F | Ht 64.0 in | Wt 174.0 lb

## 2022-08-16 DIAGNOSIS — K3532 Acute appendicitis with perforation and localized peritonitis, without abscess: Secondary | ICD-10-CM

## 2022-08-16 DIAGNOSIS — K631 Perforation of intestine (nontraumatic): Secondary | ICD-10-CM

## 2022-08-16 DIAGNOSIS — Z09 Encounter for follow-up examination after completed treatment for conditions other than malignant neoplasm: Secondary | ICD-10-CM

## 2022-08-16 NOTE — Progress Notes (Signed)
[facility] POST-OP OFFICE VISIT  08/16/2022  HPI: Katherine Foster is a 46 y.o. female 27 days s/p exploratory laparotomy, cecectomy, and appendectomy for iatrogenic colon perforation.   She is doing much better than the last time I saw her Very mild incisional and right abdominal pain; markedly improved No fever, chills, nausea, emesis Bowel movements are improved No issues with incision No other complaints   Vital signs: BP 133/78   Pulse 80   Temp 99.2 F (37.3 C) (Oral)   Ht '5\' 4"'$  (1.626 m)   Wt 174 lb (78.9 kg)   LMP 07/20/2022 (Exact Date)   SpO2 98%   BMI 29.87 kg/m    Physical Exam: Constitutional: Well appearing female, NAD Abdomen: Soft, non-tender, non-distended, no rebound/guarding Skin: Laparotomy is well healed; there is a very small pin-point area of serous drainage (scant) inferiorly  Assessment/Plan: This is a 46 y.o. female 27 days s/p exploratory laparotomy, cecectomy, and appendectomy for iatrogenic colon perforation.    - Pain control prn  - Reviewed wound care recommendation  - Reviewed lifting restrictions; 6 weeks total (2 more weeks)  - She can follow up on as needed basis; She understands to call with questions/concerns  -- Edison Simon, PA-C [facility] 08/16/2022, 3:53 PM M-F: 7am - 4pm

## 2022-08-16 NOTE — Patient Instructions (Signed)

## 2022-09-02 ENCOUNTER — Other Ambulatory Visit: Payer: Self-pay | Admitting: Internal Medicine

## 2022-09-02 DIAGNOSIS — R053 Chronic cough: Secondary | ICD-10-CM

## 2022-09-03 ENCOUNTER — Telehealth: Payer: Self-pay | Admitting: Internal Medicine

## 2022-09-03 ENCOUNTER — Encounter: Payer: Self-pay | Admitting: Internal Medicine

## 2022-09-03 NOTE — Telephone Encounter (Unsigned)
Copied from [place] [id number]. Topic: General - Other >> Sep 03, 2022  3:46 PM Everette C wrote: Reason for CRM: The patient has called to follow up on their previous request for a refill of their gabapentin (NEURONTIN) 300 MG capsule [[id number]]  Please contact the patient further when possible regarding their medication

## 2022-09-03 NOTE — Telephone Encounter (Signed)
Medication is not on the current list.

## 2022-09-03 NOTE — Telephone Encounter (Signed)
Unable to refill per protocol, Rx expired. Medication was discontinued 07/19/22, will refuse.  Requested Prescriptions  Pending Prescriptions Disp Refills   gabapentin (NEURONTIN) 300 MG capsule [Pharmacy Med Name: GABAPENTIN '300MG'$  CAPSULES] 60 capsule 0    Sig: TAKE 1 CAPSULE(300 MG) BY MOUTH TWICE DAILY AS NEEDED     Neurology: Anticonvulsants - gabapentin Failed - 09/02/2022  8:55 PM      Failed - Cr in normal range and within 360 days    Creat  Date Value Ref Range Status  06/07/2022 0.91 0.50 - 0.99 mg/dL Final   Creatinine, Ser  Date Value Ref Range Status  07/27/2022 1.02 (H) 0.44 - 1.00 mg/dL Final         Passed - Completed PHQ-2 or PHQ-9 in the last 360 days      Passed - Valid encounter within last 12 months    Recent Outpatient Visits           3 weeks ago Colon perforation [facility])   [place], NP   2 months ago Encounter for general adult medical examination with abnormal findings   [facility] [place], Coralie Keens, NP   1 year ago Mixed hyperlipidemia   [place], Coralie Keens, NP   1 year ago Sebaceous cyst of labia   [facility] [place], Coralie Keens, NP   1 year ago Routine general medical examination at a health care facility   [place], NP       Future Appointments             In 3 months Baity, Coralie Keens, NP [facility], [facility]

## 2022-09-04 ENCOUNTER — Telehealth: Payer: Self-pay | Admitting: Internal Medicine

## 2022-09-04 MED ORDER — GABAPENTIN 300 MG PO CAPS: 300.0000 mg | ORAL_CAPSULE | Freq: Two times a day (BID) | ORAL | 0 refills | Status: DC

## 2022-09-04 NOTE — Telephone Encounter (Signed)
Pharmacy request clarification of dosing:  Gabapentin 300 mg Oral 2 times daily, Day 1 - 300 mg every 6 hours, Day 2 - 300 mg every 8 hours, Day 3 - 300 mg every 12 hours, Day 4-7 - 300 mg one dose Stepped dosing or 2 times daily

## 2022-09-04 NOTE — Telephone Encounter (Signed)
This was filled earlier today

## 2022-09-04 NOTE — Telephone Encounter (Signed)
Copied from [place] [id number]. Topic: General - Other >> Sep 04, 2022 12:12 PM Everette C wrote: Reason for CRM: Kaitlyn with [facility] has called to verify the directions of the patient's new prescription for gabapentin (NEURONTIN) 300 MG capsule [[id number]]  Please contact the patient's pharmacy further when possible

## 2022-09-04 NOTE — Addendum Note (Signed)
Addended by: Jearld Fenton on: 09/04/2022 02:03 PM   Modules accepted: Orders

## 2022-09-22 ENCOUNTER — Other Ambulatory Visit: Payer: Self-pay | Admitting: Internal Medicine

## 2022-09-22 DIAGNOSIS — I10 Essential (primary) hypertension: Secondary | ICD-10-CM

## 2022-09-22 DIAGNOSIS — R454 Irritability and anger: Secondary | ICD-10-CM

## 2022-09-24 NOTE — Telephone Encounter (Signed)
Requested Prescriptions  Pending Prescriptions Disp Refills   citalopram (CELEXA) 20 MG tablet [Pharmacy Med Name: CITALOPRAM '20MG'$  TABLETS] 90 tablet     Sig: TAKE 1 TABLET(20 MG) BY MOUTH DAILY     Psychiatry:  Antidepressants - SSRI Passed - 09/22/2022  7:05 AM      Passed - Completed PHQ-2 or PHQ-9 in the last 360 days      Passed - Valid encounter within last 6 months    Recent Outpatient Visits           1 month ago Colon perforation ([place])   [facility], Coralie Keens, NP   3 months ago Encounter for general adult medical examination with abnormal findings   [facility], Coralie Keens, NP   1 year ago Mixed hyperlipidemia   [facility] [place], Coralie Keens, NP   1 year ago Sebaceous cyst of labia   [facility] [place], Coralie Keens, NP   1 year ago Routine general medical examination at a health care facility   [place], NP       Future Appointments             In 2 months [place], Coralie Keens, NP [facility]             losartan (COZAAR) 100 MG tablet [Pharmacy Med Name: LOSARTAN '100MG'$  TABLETS] 90 tablet 1    Sig: TAKE 1 TABLET(100 MG) BY MOUTH DAILY     Cardiovascular:  Angiotensin Receptor Blockers Failed - 09/22/2022  7:05 AM      Failed - Cr in normal range and within 180 days    Creat  Date Value Ref Range Status  06/07/2022 0.91 0.50 - 0.99 mg/dL Final   Creatinine, Ser  Date Value Ref Range Status  07/27/2022 1.02 (H) 0.44 - 1.00 mg/dL Final         Passed - K in normal range and within 180 days    Potassium  Date Value Ref Range Status  07/27/2022 3.9 3.5 - 5.1 mmol/L Final         Passed - Patient is not pregnant      Passed - Last BP in normal range    BP Readings from Last 1 Encounters:  08/16/22 133/78         Passed - Valid encounter within last 6 months    Recent Outpatient Visits           1 month ago Colon perforation [facility])    [facility] [place], Coralie Keens, NP   3 months ago Encounter for general adult medical examination with abnormal findings   [facility] [place], Coralie Keens, NP   1 year ago Mixed hyperlipidemia   [facility] [place], Coralie Keens, NP   1 year ago Sebaceous cyst of labia   [facility] [place], Coralie Keens, NP   1 year ago Routine general medical examination at a health care facility   [place], NP       Future Appointments             In 2 months Baity, Coralie Keens, NP [facility]

## 2022-09-27 ENCOUNTER — Encounter: Payer: Self-pay | Admitting: Internal Medicine

## 2022-09-27 DIAGNOSIS — R454 Irritability and anger: Secondary | ICD-10-CM

## 2022-09-27 MED ORDER — CITALOPRAM HYDROBROMIDE 20 MG PO TABS: 20.0000 mg | ORAL_TABLET | Freq: Every day | ORAL | 0 refills | Status: DC

## 2022-09-30 ENCOUNTER — Other Ambulatory Visit: Payer: Self-pay | Admitting: Internal Medicine

## 2022-09-30 DIAGNOSIS — R454 Irritability and anger: Secondary | ICD-10-CM

## 2022-10-01 NOTE — Telephone Encounter (Signed)
Unable to refill per protocol, last refill by provider 09/27/22 for 90 . Will refuse duplicate request.  Requested Prescriptions  Pending Prescriptions Disp Refills   citalopram (CELEXA) 20 MG tablet [Pharmacy Med Name: CITALOPRAM '20MG'$  TABLETS] 90 tablet 0    Sig: TAKE 1 TABLET(20 MG) BY MOUTH DAILY     Psychiatry:  Antidepressants - SSRI Passed - 09/30/2022  1:30 PM      Passed - Completed PHQ-2 or PHQ-9 in the last 360 days      Passed - Valid encounter within last 6 months    Recent Outpatient Visits           1 month ago Colon perforation [facility])   [facility] [place], Coralie Keens, NP   3 months ago Encounter for general adult medical examination with abnormal findings   [facility] [place], Coralie Keens, NP   1 year ago Mixed hyperlipidemia   [facility] [place], Coralie Keens, NP   1 year ago Sebaceous cyst of labia   [facility] [place], Coralie Keens, NP   1 year ago Routine general medical examination at a health care facility   [place], NP       Future Appointments             In 2 months Baity, Coralie Keens, NP [facility], [facility]

## 2022-10-02 MED ORDER — CITALOPRAM HYDROBROMIDE 20 MG PO TABS: 20.0000 mg | ORAL_TABLET | Freq: Every day | ORAL | 0 refills | Status: DC

## 2022-10-02 MED ORDER — CITALOPRAM HYDROBROMIDE 40 MG PO TABS: 40.0000 mg | ORAL_TABLET | Freq: Every day | ORAL | 0 refills | Status: DC

## 2022-10-02 NOTE — Addendum Note (Signed)
Addended by: Ashley Royalty E on: 10/02/2022 04:38 PM   Modules accepted: Orders

## 2022-10-14 ENCOUNTER — Other Ambulatory Visit: Payer: Self-pay | Admitting: Internal Medicine

## 2022-10-15 ENCOUNTER — Other Ambulatory Visit (HOSPITAL_COMMUNITY): Payer: Self-pay

## 2022-10-15 ENCOUNTER — Encounter: Payer: Self-pay | Admitting: Internal Medicine

## 2022-10-15 ENCOUNTER — Encounter (HOSPITAL_COMMUNITY): Payer: Self-pay

## 2022-10-15 ENCOUNTER — Other Ambulatory Visit: Payer: Self-pay

## 2022-10-15 MED ORDER — RIZATRIPTAN BENZOATE 5 MG PO TABS
5.0000 mg | ORAL_TABLET | ORAL | 2 refills | Status: DC | PRN
  Filled 2022-10-15: qty 10, 30d supply, fill #0

## 2022-10-16 ENCOUNTER — Other Ambulatory Visit: Payer: Self-pay

## 2022-10-16 NOTE — Addendum Note (Signed)
Addended by: Ashley Royalty E on: 10/16/2022 05:07 PM   Modules accepted: Orders

## 2022-10-17 MED ORDER — RIZATRIPTAN BENZOATE 5 MG PO TABS: 5.0000 mg | ORAL_TABLET | ORAL | 2 refills | Status: DC | PRN

## 2022-11-12 DIAGNOSIS — B029 Zoster without complications: Secondary | ICD-10-CM | POA: Diagnosis not present

## 2022-12-06 ENCOUNTER — Other Ambulatory Visit: Payer: Self-pay | Admitting: Internal Medicine

## 2022-12-06 DIAGNOSIS — I10 Essential (primary) hypertension: Secondary | ICD-10-CM

## 2022-12-07 NOTE — Telephone Encounter (Signed)
Requested Prescriptions  Pending Prescriptions Disp Refills   citalopram (CELEXA) 20 MG tablet [Pharmacy Med Name: CITALOPRAM 20MG TABLETS] 90 tablet 0    Sig: TAKE 1 TABLET(20 MG) BY MOUTH DAILY     Psychiatry:  Antidepressants - SSRI Passed - 12/06/2022  6:43 PM      Passed - Completed PHQ-2 or PHQ-9 in the last 360 days      Passed - Valid encounter within last 6 months    Recent Outpatient Visits           4 months ago Colon perforation [facility])   [facility] [place], Coralie Keens, NP   6 months ago Encounter for general adult medical examination with abnormal findings   [facility] [place], Coralie Keens, NP   1 year ago Mixed hyperlipidemia   [facility] [place], Coralie Keens, NP   1 year ago Sebaceous cyst of labia   [facility] [place], Coralie Keens, NP   1 year ago Routine general medical examination at a health care facility   [place], NP       Future Appointments             In 5 days [place], Coralie Keens, NP [facility]             atorvastatin (LIPITOR) 10 MG tablet [Pharmacy Med Name: ATORVASTATIN 10MG TABLETS] 90 tablet 1    Sig: TAKE 1 TABLET(10 MG) BY MOUTH DAILY     Cardiovascular:  Antilipid - Statins Failed - 12/06/2022  6:43 PM      Failed - Lipid Panel in normal range within the last 12 months    Cholesterol  Date Value Ref Range Status  06/07/2022 203 (H) <200 mg/dL Final   LDL Cholesterol (Calc)  Date Value Ref Range Status  06/07/2022 133 (H) mg/dL (calc) Final    Comment:    Reference range: <100 . Desirable range <100 mg/dL for primary prevention;   <70 mg/dL for patients with CHD or diabetic patients  with > or = 2 CHD risk factors. Marland Kitchen LDL-C is now calculated using the Martin-Hopkins  calculation, which is a validated novel method providing  better accuracy than the Friedewald  equation in the  estimation of LDL-C.  Cresenciano Genre et al. Annamaria Helling. [id number]): 2061-2068  (http://education.QuestDiagnostics.com/faq/FAQ164)    HDL  Date Value Ref Range Status  06/07/2022 36 (L) > OR = 50 mg/dL Final   Triglycerides  Date Value Ref Range Status  06/07/2022 203 (H) <150 mg/dL Final    Comment:    . If a non-fasting specimen was collected, consider repeat triglyceride testing on a fasting specimen if clinically indicated.  Yates Decamp et al. J. of Clin. Lipidol. [id number]. Marland Kitchen          Passed - Patient is not pregnant      Passed - Valid encounter within last 12 months    Recent Outpatient Visits           4 months ago Colon perforation [facility])   [facility] [place], Coralie Keens, NP   6 months ago Encounter for general adult medical examination with abnormal findings   [facility] [place], Coralie Keens, NP   1 year ago Mixed hyperlipidemia   [facility] [place], Coralie Keens, [place]  1 year ago Sebaceous cyst of labia   [facility] [place], [place], NP   1 year ago Routine general medical examination at a health care facility   [place], NP       Future Appointments             In 5 days [place], Coralie Keens, NP [facility]             losartan (COZAAR) 100 MG tablet [Pharmacy Med Name: LOSARTAN 100MG TABLETS] 90 tablet 0    Sig: TAKE 1 TABLET(100 MG) BY MOUTH DAILY     Cardiovascular:  Angiotensin Receptor Blockers Failed - 12/06/2022  6:43 PM      Failed - Cr in normal range and within 180 days    Creat  Date Value Ref Range Status  06/07/2022 0.91 0.50 - 0.99 mg/dL Final   Creatinine, Ser  Date Value Ref Range Status  07/27/2022 1.02 (H) 0.44 - 1.00 mg/dL Final         Passed - K in normal range and within 180 days    Potassium  Date Value Ref Range Status  07/27/2022 3.9  3.5 - 5.1 mmol/L Final         Passed - Patient is not pregnant      Passed - Last BP in normal range    BP Readings from Last 1 Encounters:  08/16/22 133/78         Passed - Valid encounter within last 6 months    Recent Outpatient Visits           4 months ago Colon perforation [facility])   [facility] [place], Coralie Keens, NP   6 months ago Encounter for general adult medical examination with abnormal findings   [facility] [place], Coralie Keens, NP   1 year ago Mixed hyperlipidemia   [facility] [place], Coralie Keens, NP   1 year ago Sebaceous cyst of labia   [facility] [place], Coralie Keens, NP   1 year ago Routine general medical examination at a health care facility   [place], NP       Future Appointments             In 5 days [place], Coralie Keens, NP [facility], [facility]

## 2022-12-12 ENCOUNTER — Ambulatory Visit: Payer: BC Managed Care – PPO | Admitting: Internal Medicine

## 2022-12-12 ENCOUNTER — Encounter: Payer: Self-pay | Admitting: Internal Medicine

## 2022-12-12 VITALS — BP 126/84 | HR 78 | Temp 97.3°F | Wt 183.0 lb

## 2022-12-12 DIAGNOSIS — I700 Atherosclerosis of aorta: Secondary | ICD-10-CM | POA: Diagnosis not present

## 2022-12-12 DIAGNOSIS — G43839 Menstrual migraine, intractable, without status migrainosus: Secondary | ICD-10-CM

## 2022-12-12 DIAGNOSIS — R7309 Other abnormal glucose: Secondary | ICD-10-CM

## 2022-12-12 DIAGNOSIS — D500 Iron deficiency anemia secondary to blood loss (chronic): Secondary | ICD-10-CM

## 2022-12-12 DIAGNOSIS — E6609 Other obesity due to excess calories: Secondary | ICD-10-CM | POA: Diagnosis not present

## 2022-12-12 DIAGNOSIS — Z6831 Body mass index (BMI) 31.0-31.9, adult: Secondary | ICD-10-CM

## 2022-12-12 DIAGNOSIS — M170 Bilateral primary osteoarthritis of knee: Secondary | ICD-10-CM

## 2022-12-12 DIAGNOSIS — F419 Anxiety disorder, unspecified: Secondary | ICD-10-CM

## 2022-12-12 DIAGNOSIS — E782 Mixed hyperlipidemia: Secondary | ICD-10-CM

## 2022-12-12 DIAGNOSIS — H6123 Impacted cerumen, bilateral: Secondary | ICD-10-CM

## 2022-12-12 DIAGNOSIS — R739 Hyperglycemia, unspecified: Secondary | ICD-10-CM

## 2022-12-12 DIAGNOSIS — F32A Depression, unspecified: Secondary | ICD-10-CM

## 2022-12-12 DIAGNOSIS — I10 Essential (primary) hypertension: Secondary | ICD-10-CM

## 2022-12-12 MED ORDER — ASPIRIN 81 MG PO TBEC: 81.0000 mg | DELAYED_RELEASE_TABLET | Freq: Every day | ORAL | 12 refills | Status: AC

## 2022-12-12 NOTE — Progress Notes (Signed)
Subjective:    Patient ID: Katherine Foster, female    DOB: Mar 01, 1976, 47 y.o.   MRN: [id number]  HPI  Patient presents to clinic today for follow-up of chronic conditions.  Chronic Cough: Intermittent, managed with Gabapentin as needed with good relief of symptoms.  HLD with Aortic Atherosclerosis: Her last LDL was 133, triglycerides 203, 05/2022.  She denies myalgia on Atorvastatin.  She does not consume low-fat diet.  HTN: Her BP today is 126/84.  She is taking Losartan as prescribed.  ECG from 03/2021 reviewed.  Migraines: These occur rarely, about 1 x month.  Triggered by hormones.  She takes Rizatriptan as needed with good relief of symptoms.  She does not follow with neurology.  OA: Mainly in her knees.  She does not take anything OTC for this.  She does not follow with orthopedics.  Anxiety and Depression: Chronic, managed on Citalopram.  She has been under more stress lately.  She is not currently seeing a therapist.  She denies SI/HI.  Anemia: Her last H/H was 10/31.4, 05/2022.  She is not taking any oral iron at this time.  She does not follow with hematology.  Review of Systems     Past Medical History:  Diagnosis Date   Chronic cough    Constipation    Hyperlipidemia    Hypertension    Joint pain    Lower extremity edema    Palpitations    SOB (shortness of breath) on exertion     Current Outpatient Medications  Medication Sig Dispense Refill   atorvastatin (LIPITOR) 10 MG tablet TAKE 1 TABLET(10 MG) BY MOUTH DAILY 90 tablet 1   citalopram (CELEXA) 20 MG tablet TAKE 1 TABLET(20 MG) BY MOUTH DAILY 90 tablet 0   gabapentin (NEURONTIN) 300 MG capsule Take 1 capsule (300 mg total) by mouth 2 (two) times daily. 180 capsule 0   ibuprofen (ADVIL) 600 MG tablet Take 1 tablet (600 mg total) by mouth every 6 (six) hours as needed. 30 tablet 0   losartan (COZAAR) 100 MG tablet TAKE 1 TABLET(100 MG) BY MOUTH DAILY 90 tablet 0   ondansetron (ZOFRAN-ODT) 4 MG disintegrating  tablet Take 1 tablet (4 mg total) by mouth every 6 (six) hours as needed for nausea. 20 tablet 0   rizatriptan (MAXALT) 5 MG tablet Take 1 tablet (5 mg total) by mouth as needed for migraine. May repeat in 2 hours if needed 10 tablet 2   Vitamin D, Ergocalciferol, (DRISDOL) 1.25 MG (50000 UNIT) CAPS capsule Take 1 capsule (50,000 Units total) by mouth every 7 (seven) days. 12 capsule 0   No current facility-administered medications for this visit.    Allergies  Allergen Reactions   Sulfa Antibiotics     Family History  Problem Relation Age of Onset   Heart disease Mother    Hypertension Mother    Diabetes Mellitus II Mother    Hypothyroidism Mother    Anxiety disorder Mother    Liver disease Mother    Obesity Mother    Obesity Father    Thyroid disease Father    Hyperlipidemia Father    Hypertension Father    Heart disease Father    Heart attack Father    Diabetes Mellitus II Father    Sudden death Father    Breast cancer Maternal Grandmother    Cancer Maternal Grandmother    Diabetes Maternal Grandfather    Heart attack Paternal Grandfather    Colon cancer Neg Hx  Social History   Socioeconomic History   Marital status: Married    Spouse name: Not on file   Number of children: Not on file   Years of education: Not on file   Highest education level: Not on file  Occupational History   Occupation: Finacial services officer/mortage Engineer, site  Tobacco Use   Smoking status: Never   Smokeless tobacco: Never  Vaping Use   Vaping Use: Never used  Substance and Sexual Activity   Alcohol use: Yes    Comment: occ   Drug use: Never   Sexual activity: Not on file  Other Topics Concern   Not on file  Social History Narrative   Not on file   Social Determinants of Health   Financial Resource Strain: Not on file  Food Insecurity: Not on file  Transportation Needs: Not on file  Physical Activity: Not on file  Stress: Not on file  Social Connections: Not on  file  Intimate Partner Violence: Not on file     Constitutional: Patient reports intermittent headaches.  Denies fever, malaise, fatigue, or abrupt weight changes.  HEENT: Patient reports left ear fullness.  Denies eye pain, eye redness, ear pain, ringing in the ears, wax buildup, runny nose, nasal congestion, bloody nose, or sore throat. Respiratory: Patient reports intermittent cough.  Denies difficulty breathing, shortness of breath, or sputum production.   Cardiovascular: Denies chest pain, chest tightness, palpitations or swelling in the hands or feet.  Gastrointestinal: Denies abdominal pain, bloating, constipation, diarrhea or blood in the stool.  GU: Denies urgency, frequency, pain with urination, burning sensation, blood in urine, odor or discharge. Musculoskeletal: Patient reports intermittent knee pain.  Denies decrease in range of motion, difficulty with gait, muscle pain or joint swelling.  Skin: Denies redness, rashes, lesions or ulcercations.  Neurological: Denies dizziness, difficulty with memory, difficulty with speech or problems with balance and coordination.  Psych: Patient has a history of anxiety and depression.  Denies SI/HI.  No other specific complaints in a complete review of systems (except as listed in HPI above).   Objective:   Physical Exam  BP 126/84 (BP Location: Right Arm, Patient Position: Sitting, Cuff Size: Large)   Pulse 78   Temp (!) 97.3 F (36.3 C) (Temporal)   Wt 183 lb (83 kg)   SpO2 97%   BMI 31.41 kg/m   Wt Readings from Last 3 Encounters:  08/16/22 174 lb (78.9 kg)  08/08/22 168 lb (76.2 kg)  08/03/22 167 lb (75.8 kg)    General: Appears her stated age, obese, in NAD. Skin: Warm, dry and intact.  HEENT: Head: normal shape and size; Eyes: sclera white, no icterus, conjunctiva pink, PERRLA and EOMs intact; Ears: Bilateral cerumen impaction;  Cardiovascular: Normal rate and rhythm. S1,S2 noted.  No murmur, rubs or gallops noted. No JVD  or BLE edema.  Pulmonary/Chest: Normal effort and positive vesicular breath sounds. No respiratory distress. No wheezes, rales or ronchi noted.  Musculoskeletal: No difficulty with gait.  Neurological: Alert and oriented.  Coordination normal.  Psychiatric: Mood and affect normal. Behavior is normal. Judgment and thought content normal.     BMET    Component Value Date/Time   NA 141 07/27/2022 0432   NA 140 02/15/2022 0951   K 3.9 07/27/2022 0432   CL 106 07/27/2022 0432   CO2 27 07/27/2022 0432   GLUCOSE 107 (H) 07/27/2022 0432   BUN 12 07/27/2022 0432   BUN 9 02/15/2022 0951   CREATININE 1.02 (H)  07/27/2022 0432   CREATININE 0.91 06/07/2022 0909   CALCIUM 9.2 07/27/2022 0432   GFRNONAA >60 07/27/2022 0432   GFRNONAA 84 12/29/2019 1031   GFRAA 97 12/29/2019 1031    Lipid Panel     Component Value Date/Time   CHOL 203 (H) 06/07/2022 0909   TRIG 203 (H) 06/07/2022 0909   HDL 36 (L) 06/07/2022 0909   CHOLHDL 5.6 (H) 06/07/2022 0909   LDLCALC 133 (H) 06/07/2022 0909    CBC    Component Value Date/Time   WBC 12.0 (H) 07/27/2022 0432   RBC 3.51 (L) 07/27/2022 0432   HGB 10.0 (L) 07/27/2022 0432   HCT 31.4 (L) 07/27/2022 0432   PLT 250 07/27/2022 0432   MCV 89.5 07/27/2022 0432   MCH 28.5 07/27/2022 0432   MCHC 31.8 07/27/2022 0432   RDW 12.8 07/27/2022 0432   LYMPHSABS 2.0 07/27/2022 0432   MONOABS 0.7 07/27/2022 0432   EOSABS 0.5 07/27/2022 0432   BASOSABS 0.1 07/27/2022 0432    Hgb A1C Lab Results  Component Value Date   HGBA1C 5.4 06/07/2022            Assessment & Plan:   Bilateral Cerumen Impaction:  Manual lavage by CMA Advised her to try Debrox 2 times weekly OTC to prevent wax buildup   RTC in 6 months for annual exam Webb Silversmith, NP

## 2022-12-12 NOTE — Assessment & Plan Note (Signed)
CBC and iron panel today 

## 2022-12-12 NOTE — Assessment & Plan Note (Signed)
Stable on her current dose of citalopram Support offered

## 2022-12-12 NOTE — Assessment & Plan Note (Addendum)
C-Met and lipid profile today Encouraged her to consume a low-fat diet Continue atorvastatin 

## 2022-12-12 NOTE — Assessment & Plan Note (Signed)
Continue rizatriptan as needed

## 2022-12-12 NOTE — Assessment & Plan Note (Signed)
Encouraged diet and exercise for weight loss ?

## 2022-12-12 NOTE — Assessment & Plan Note (Signed)
Encourage weight loss as this can help reduce joint pain Okay to take Tylenol OTC as needed for joint pain

## 2022-12-12 NOTE — Assessment & Plan Note (Signed)
C-Met and lipid profile today Encouraged her to consume a low-fat diet Continue atorvastatin We will have her start baby aspirin 

## 2022-12-12 NOTE — Patient Instructions (Signed)

## 2022-12-12 NOTE — Assessment & Plan Note (Addendum)
Controlled on losartan Reinforced DASH diet and exercise for weight loss C-Met today 

## 2022-12-13 ENCOUNTER — Encounter: Payer: Self-pay | Admitting: Internal Medicine

## 2022-12-13 DIAGNOSIS — E782 Mixed hyperlipidemia: Secondary | ICD-10-CM

## 2022-12-13 LAB — COMPLETE METABOLIC PANEL WITH GFR
AG Ratio: 1.4 (calc) (ref 1.0–2.5)
ALT: 21 U/L (ref 6–29)
AST: 14 U/L (ref 10–35)
Albumin: 4.5 g/dL (ref 3.6–5.1)
Alkaline phosphatase (APISO): 72 U/L (ref 31–125)
BUN: 12 mg/dL (ref 7–25)
CO2: 25 mmol/L (ref 20–32)
Calcium: 9.8 mg/dL (ref 8.6–10.2)
Chloride: 103 mmol/L (ref 98–110)
Creat: 0.94 mg/dL (ref 0.50–0.99)
Globulin: 3.2 g/dL (calc) (ref 1.9–3.7)
Glucose, Bld: 82 mg/dL (ref 65–99)
Potassium: 4.3 mmol/L (ref 3.5–5.3)
Sodium: 139 mmol/L (ref 135–146)
Total Bilirubin: 0.5 mg/dL (ref 0.2–1.2)
Total Protein: 7.7 g/dL (ref 6.1–8.1)
eGFR: 75 mL/min/{1.73_m2} (ref >=60)

## 2022-12-13 LAB — IRON,TIBC AND FERRITIN PANEL
%SAT: 16 % (calc) (ref 16–45)
Ferritin: 36 ng/mL (ref 16–232)
Iron: 63 ug/dL (ref 40–190)
TIBC: 402 mcg/dL (calc) (ref 250–450)

## 2022-12-13 LAB — CBC
HCT: 39.8 % (ref 35.0–45.0)
Hemoglobin: 13.5 g/dL (ref 11.7–15.5)
MCH: 29.6 pg (ref 27.0–33.0)
MCHC: 33.9 g/dL (ref 32.0–36.0)
MCV: 87.3 fL (ref 80.0–100.0)
MPV: 9.8 fL (ref 7.5–12.5)
Platelets: 276 10*3/uL (ref 140–400)
RBC: 4.56 10*6/uL (ref 3.80–5.10)
RDW: 13.2 % (ref 11.0–15.0)
WBC: 9.8 10*3/uL (ref 3.8–10.8)

## 2022-12-13 LAB — LIPID PANEL
Cholesterol: 208 mg/dL — ABNORMAL HIGH (ref <200)
HDL: 43 mg/dL — ABNORMAL LOW (ref >=50)
LDL Cholesterol (Calc): 120 mg/dL (calc) — ABNORMAL HIGH
Non-HDL Cholesterol (Calc): 165 mg/dL (calc) — ABNORMAL HIGH (ref <130)
Total CHOL/HDL Ratio: 4.8 (calc) (ref <5.0)
Triglycerides: 318 mg/dL — ABNORMAL HIGH (ref <150)

## 2022-12-13 LAB — HEMOGLOBIN A1C
Hgb A1c MFr Bld: 5.6 % of total Hgb (ref <5.7)
Mean Plasma Glucose: 114 mg/dL
eAG (mmol/L): 6.3 mmol/L

## 2022-12-14 ENCOUNTER — Other Ambulatory Visit: Payer: Self-pay | Admitting: Internal Medicine

## 2022-12-14 DIAGNOSIS — E559 Vitamin D deficiency, unspecified: Secondary | ICD-10-CM

## 2022-12-14 MED ORDER — ATORVASTATIN CALCIUM 20 MG PO TABS: 20.0000 mg | ORAL_TABLET | Freq: Every day | ORAL | 1 refills | Status: DC

## 2022-12-15 ENCOUNTER — Other Ambulatory Visit: Payer: Self-pay | Admitting: Internal Medicine

## 2022-12-15 DIAGNOSIS — E559 Vitamin D deficiency, unspecified: Secondary | ICD-10-CM

## 2022-12-17 NOTE — Telephone Encounter (Signed)
Requested medications are due for refill today.  unsure  Requested medications are on the active medications list.  yes  Last refill. 06/12/2022 #120 rf  Future visit scheduled.   yes  Notes to clinic.  Refill not delegated.    Requested Prescriptions  Pending Prescriptions Disp Refills   Vitamin D, Ergocalciferol, (DRISDOL) 1.25 MG (50000 UNIT) CAPS capsule [Pharmacy Med Name: VITAMIN D2 50,000IU (ERGO) CAP RX] 12 capsule 0    Sig: TAKE 1 CAPSULE BY MOUTH EVERY 7 DAYS     Endocrinology:  Vitamins - Vitamin D Supplementation 2 Failed - 12/14/2022  7:16 PM      Failed - Manual Review: Route requests for 50,000 IU strength to the provider      Passed - Ca in normal range and within 360 days    Calcium  Date Value Ref Range Status  12/12/2022 9.8 8.6 - 10.2 mg/dL Final         Passed - Vitamin D in normal range and within 360 days    Vit D, 25-Hydroxy  Date Value Ref Range Status  02/15/2022 34.9 30.0 - 100.0 ng/mL Final    Comment:    Vitamin D deficiency has been defined by the Institute of Medicine and an Endocrine Society practice guideline as a level of serum 25-OH vitamin D less than 20 ng/mL (1,2). The Endocrine Society went on to further define vitamin D insufficiency as a level between 21 and 29 ng/mL (2). 1. IOM (Institute of Medicine). 2010. Dietary reference    intakes for calcium and D. [place]: The    [employer]. 2. Holick MF, Binkley [place], Bischoff-Ferrari HA, et al.    Evaluation, treatment, and prevention of vitamin D    deficiency: an Endocrine Society clinical practice    guideline. JCEM. 2011 Jul; 96(7):1911-30.          Passed - Valid encounter within last 12 months    Recent Outpatient Visits           5 days ago Aortic atherosclerosis [facility])   [facility] [place], Coralie Keens, NP   4 months ago Colon perforation [facility])   [facility] [place], Coralie Keens, NP   6 months ago Encounter  for general adult medical examination with abnormal findings   [facility] [place], Coralie Keens, NP   1 year ago Mixed hyperlipidemia   [facility] [place], Coralie Keens, NP   1 year ago Sebaceous cyst of labia   [facility] [place], Coralie Keens, NP       Future Appointments             In 6 months [place], Coralie Keens, NP [facility], [facility]

## 2022-12-18 ENCOUNTER — Encounter: Payer: Self-pay | Admitting: Internal Medicine

## 2022-12-18 DIAGNOSIS — E559 Vitamin D deficiency, unspecified: Secondary | ICD-10-CM

## 2022-12-18 MED ORDER — VITAMIN D (ERGOCALCIFEROL) 1.25 MG (50000 UNIT) PO CAPS: 50000.0000 [IU] | ORAL_CAPSULE | ORAL | 0 refills | Status: DC

## 2023-03-02 ENCOUNTER — Other Ambulatory Visit: Payer: Self-pay | Admitting: Internal Medicine

## 2023-03-02 DIAGNOSIS — E559 Vitamin D deficiency, unspecified: Secondary | ICD-10-CM

## 2023-03-04 NOTE — Telephone Encounter (Signed)
Requested medications are due for refill today.  yes  Requested medications are on the active medications list.  yes  Last refill. 12/08/2022 #12 0 r  Future visit scheduled.   yes  Notes to clinic.  Refill not delegated.    Requested Prescriptions  Pending Prescriptions Disp Refills   Vitamin D, Ergocalciferol, (DRISDOL) 1.25 MG (50000 UNIT) CAPS capsule [Pharmacy Med Name: VITAMIN D2 50,000IU (ERGO) CAP RX] 12 capsule 0    Sig: TAKE 1 CAPSULE BY MOUTH EVERY 7 DAYS     Endocrinology:  Vitamins - Vitamin D Supplementation 2 Failed - 03/02/2023  9:42 PM      Failed - Manual Review: Route requests for 50,000 IU strength to the provider      Failed - Vitamin D in normal range and within 360 days    Vit D, 25-Hydroxy  Date Value Ref Range Status  02/15/2022 34.9 30.0 - 100.0 ng/mL Final    Comment:    Vitamin D deficiency has been defined by the Institute of Medicine and an Endocrine Society practice guideline as a level of serum 25-OH vitamin D less than 20 ng/mL (1,2). The Endocrine Society went on to further define vitamin D insufficiency as a level between 21 and 29 ng/mL (2). 1. IOM (Institute of Medicine). 2010. Dietary reference    intakes for calcium and D. [place]: The    [employer]. 2. Holick MF, Binkley [place], Bischoff-Ferrari HA, et al.    Evaluation, treatment, and prevention of vitamin D    deficiency: an Endocrine Society clinical practice    guideline. JCEM. 2011 Jul; 96(7):1911-30.          Passed - Ca in normal range and within 360 days    Calcium  Date Value Ref Range Status  12/12/2022 9.8 8.6 - 10.2 mg/dL Final         Passed - Valid encounter within last 12 months    Recent Outpatient Visits           2 months ago Aortic atherosclerosis [facility])   [place] [facility] [place], Salvadore [place], NP   6 months ago Colon perforation [facility])   [facility] [place], Salvadore Oxford, NP   9 months ago Encounter for  general adult medical examination with abnormal findings   [place] [facility] [place], Salvadore Oxford, NP   1 year ago Mixed hyperlipidemia   [place] [facility] [place], Salvadore Oxford, NP   1 year ago Sebaceous cyst of labia   [place] [facility] [place], Salvadore Oxford, NP       Future Appointments             In 3 months Baity, Salvadore Oxford, NP [place] [facility], [facility]

## 2023-03-19 ENCOUNTER — Other Ambulatory Visit: Payer: Self-pay

## 2023-03-19 ENCOUNTER — Other Ambulatory Visit: Payer: BC Managed Care – PPO

## 2023-03-19 DIAGNOSIS — E559 Vitamin D deficiency, unspecified: Secondary | ICD-10-CM | POA: Diagnosis not present

## 2023-03-19 DIAGNOSIS — E782 Mixed hyperlipidemia: Secondary | ICD-10-CM | POA: Diagnosis not present

## 2023-03-20 ENCOUNTER — Encounter: Payer: Self-pay | Admitting: Internal Medicine

## 2023-03-20 DIAGNOSIS — E782 Mixed hyperlipidemia: Secondary | ICD-10-CM

## 2023-03-20 LAB — LIPID PANEL
Cholesterol: 146 mg/dL (ref <200)
HDL: 34 mg/dL — ABNORMAL LOW (ref >=50)
Non-HDL Cholesterol (Calc): 112 mg/dL (calc) (ref <130)
Total CHOL/HDL Ratio: 4.3 (calc) (ref <5.0)
Triglycerides: 437 mg/dL — ABNORMAL HIGH (ref <150)

## 2023-03-20 LAB — VITAMIN D 25 HYDROXY (VIT D DEFICIENCY, FRACTURES): Vit D, 25-Hydroxy: 74 ng/mL (ref 30–100)

## 2023-03-21 MED ORDER — ATORVASTATIN CALCIUM 40 MG PO TABS: 40.0000 mg | ORAL_TABLET | Freq: Every day | ORAL | 1 refills | Status: DC

## 2023-03-21 NOTE — Addendum Note (Signed)
Addended by: Lorre Munroe on: 03/21/2023 01:51 PM   Modules accepted: Orders

## 2023-03-29 ENCOUNTER — Other Ambulatory Visit: Payer: Self-pay | Admitting: Internal Medicine

## 2023-03-29 NOTE — Telephone Encounter (Signed)
Requested Prescriptions  Pending Prescriptions Disp Refills   citalopram (CELEXA) 20 MG tablet [Pharmacy Med Name: CITALOPRAM 20MG  TABLETS] 90 tablet 0    Sig: TAKE 1 TABLET(20 MG) BY MOUTH DAILY     Psychiatry:  Antidepressants - SSRI Passed - 03/29/2023  7:05 AM      Passed - Completed PHQ-2 or PHQ-9 in the last 360 days      Passed - Valid encounter within last 6 months    Recent Outpatient Visits           3 months ago Aortic atherosclerosis [facility])   [place] [facility] [place], Salvadore Oxford, NP   7 months ago Colon perforation [facility])   [place] [facility] [place], Salvadore Oxford, NP   9 months ago Encounter for general adult medical examination with abnormal findings   [place] [facility] [place], Salvadore Oxford, NP   1 year ago Mixed hyperlipidemia   [place] [facility] [place], Salvadore Oxford, NP   1 year ago Sebaceous cyst of labia   [place] [facility] [place], Salvadore Oxford, NP       Future Appointments             In 2 months [place], Salvadore Oxford, NP [place] [facility], [facility]

## 2023-04-15 ENCOUNTER — Other Ambulatory Visit: Payer: Self-pay | Admitting: Internal Medicine

## 2023-04-15 DIAGNOSIS — I10 Essential (primary) hypertension: Secondary | ICD-10-CM

## 2023-04-16 ENCOUNTER — Other Ambulatory Visit: Payer: Self-pay | Admitting: Internal Medicine

## 2023-04-16 ENCOUNTER — Other Ambulatory Visit: Payer: Self-pay

## 2023-04-16 DIAGNOSIS — I10 Essential (primary) hypertension: Secondary | ICD-10-CM

## 2023-04-16 MED ORDER — LOSARTAN POTASSIUM 100 MG PO TABS
ORAL_TABLET | ORAL | 0 refills | Status: DC
Start: 2023-04-16 — End: 2023-04-16

## 2023-04-16 MED ORDER — LOSARTAN POTASSIUM 100 MG PO TABS
ORAL_TABLET | ORAL | 0 refills | Status: DC
Start: 2023-04-16 — End: 2023-04-17

## 2023-04-16 MED ORDER — GABAPENTIN 300 MG PO CAPS: 300.0000 mg | ORAL_CAPSULE | Freq: Two times a day (BID) | ORAL | 0 refills | Status: DC

## 2023-04-17 NOTE — Telephone Encounter (Signed)
No confirmation from pharmacy, will resend.  Requested Prescriptions  Pending Prescriptions Disp Refills   losartan (COZAAR) 100 MG tablet [Pharmacy Med Name: LOSARTAN 100MG  TABLETS] 90 tablet 0    Sig: TAKE 1 TABLET(100 MG) BY MOUTH DAILY     Cardiovascular:  Angiotensin Receptor Blockers Passed - 04/16/2023  4:21 PM      Passed - Cr in normal range and within 180 days    Creat  Date Value Ref Range Status  12/12/2022 0.94 0.50 - 0.99 mg/dL Final         Passed - K in normal range and within 180 days    Potassium  Date Value Ref Range Status  12/12/2022 4.3 3.5 - 5.3 mmol/L Final         Passed - Patient is not pregnant      Passed - Last BP in normal range    BP Readings from Last 1 Encounters:  12/12/22 126/84         Passed - Valid encounter within last 6 months    Recent Outpatient Visits           4 months ago Aortic atherosclerosis [facility])   [place] [facility] [place], Salvadore Oxford, NP   8 months ago Colon perforation [facility])   [place] [facility] [place], Salvadore Oxford, NP   10 months ago Encounter for general adult medical examination with abnormal findings   [place] [facility] [place], Salvadore Oxford, NP   1 year ago Mixed hyperlipidemia   [place] [facility] [place], Salvadore Oxford, NP   1 year ago Sebaceous cyst of labia    [facility] [place], Salvadore Oxford, NP       Future Appointments             In 2 months Baity, Salvadore Oxford, NP  [facility], [facility]

## 2023-04-28 ENCOUNTER — Encounter: Payer: Self-pay | Admitting: Internal Medicine

## 2023-04-29 MED ORDER — ATORVASTATIN CALCIUM 40 MG PO TABS: 40.0000 mg | ORAL_TABLET | Freq: Every day | ORAL | 1 refills | Status: DC

## 2023-05-09 ENCOUNTER — Encounter: Payer: Self-pay | Admitting: Internal Medicine

## 2023-06-18 ENCOUNTER — Ambulatory Visit (INDEPENDENT_AMBULATORY_CARE_PROVIDER_SITE_OTHER): Payer: BC Managed Care – PPO | Admitting: Internal Medicine

## 2023-06-18 ENCOUNTER — Encounter: Payer: Self-pay | Admitting: Internal Medicine

## 2023-06-18 VITALS — BP 126/80 | HR 73 | Temp 96.9°F | Ht 64.0 in | Wt 188.0 lb

## 2023-06-18 DIAGNOSIS — R7309 Other abnormal glucose: Secondary | ICD-10-CM | POA: Diagnosis not present

## 2023-06-18 DIAGNOSIS — E559 Vitamin D deficiency, unspecified: Secondary | ICD-10-CM | POA: Diagnosis not present

## 2023-06-18 DIAGNOSIS — E782 Mixed hyperlipidemia: Secondary | ICD-10-CM | POA: Diagnosis not present

## 2023-06-18 DIAGNOSIS — Z0001 Encounter for general adult medical examination with abnormal findings: Secondary | ICD-10-CM

## 2023-06-18 DIAGNOSIS — L989 Disorder of the skin and subcutaneous tissue, unspecified: Secondary | ICD-10-CM

## 2023-06-18 DIAGNOSIS — Z6832 Body mass index (BMI) 32.0-32.9, adult: Secondary | ICD-10-CM

## 2023-06-18 DIAGNOSIS — Z1231 Encounter for screening mammogram for malignant neoplasm of breast: Secondary | ICD-10-CM

## 2023-06-18 DIAGNOSIS — E6609 Other obesity due to excess calories: Secondary | ICD-10-CM

## 2023-06-18 MED ORDER — SEMAGLUTIDE-WEIGHT MANAGEMENT 0.25 MG/0.5ML ~~LOC~~ SOAJ: 0.2500 mg | SUBCUTANEOUS | 0 refills | Status: DC

## 2023-06-18 NOTE — Assessment & Plan Note (Signed)
Encouraged diet and exercise for weight loss We will start Wegovy 0.25 mg weekly

## 2023-06-18 NOTE — Progress Notes (Signed)
Subjective:    Patient ID: Katherine Foster, female    DOB: February 01, 1976, 47 y.o.   MRN: [id number]  HPI  Patient presents the clinic today for her annual exam.  Flu: 07/2021 Tetanus: 07/2018 COVID: x 2 Pap smear: 01/2020 Mammogram: 03/2022 Colon screening: 06/2022 Vision screening: annually Dentist: biannually  Diet: She does eat meat. She consumes fruits and veggies. She does eat some fried foods. She drinks mostly sweet tea, soda and water.  Exercise: None  Review of Systems     Past Medical History:  Diagnosis Date   Chronic cough    Constipation    Hyperlipidemia    Hypertension    Joint pain    Lower extremity edema    Palpitations    SOB (shortness of breath) on exertion     Current Outpatient Medications  Medication Sig Dispense Refill   aspirin EC 81 MG tablet Take 1 tablet (81 mg total) by mouth daily. Swallow whole. 30 tablet 12   atorvastatin (LIPITOR) 40 MG tablet Take 1 tablet (40 mg total) by mouth daily. 90 tablet 1   citalopram (CELEXA) 20 MG tablet TAKE 1 TABLET(20 MG) BY MOUTH DAILY 90 tablet 0   gabapentin (NEURONTIN) 300 MG capsule Take 1 capsule (300 mg total) by mouth 2 (two) times daily. 180 capsule 0   losartan (COZAAR) 100 MG tablet TAKE 1 TABLET(100 MG) BY MOUTH DAILY 90 tablet 0   rizatriptan (MAXALT) 5 MG tablet Take 1 tablet (5 mg total) by mouth as needed for migraine. May repeat in 2 hours if needed 10 tablet 2   Vitamin D, Ergocalciferol, (DRISDOL) 1.25 MG (50000 UNIT) CAPS capsule TAKE 1 CAPSULE BY MOUTH EVERY 7 DAYS 12 capsule 0   No current facility-administered medications for this visit.    Allergies  Allergen Reactions   Sulfa Antibiotics     Family History  Problem Relation Age of Onset   Heart disease Mother    Hypertension Mother    Diabetes Mellitus II Mother    Hypothyroidism Mother    Anxiety disorder Mother    Liver disease Mother    Obesity Mother    Obesity Father    Thyroid disease Father    Hyperlipidemia  Father    Hypertension Father    Heart disease Father    Heart attack Father    Diabetes Mellitus II Father    Sudden death Father    Breast cancer Maternal Grandmother    Cancer Maternal Grandmother    Diabetes Maternal Grandfather    Heart attack Paternal Grandfather    Colon cancer Neg Hx     Social History   Socioeconomic History   Marital status: Divorced    Spouse name: Not on file   Number of children: Not on file   Years of education: Not on file   Highest education level: Not on file  Occupational History   Occupation: Data processing manager  Tobacco Use   Smoking status: Never   Smokeless tobacco: Never  Vaping Use   Vaping status: Never Used  Substance and Sexual Activity   Alcohol use: Yes    Comment: occ   Drug use: Never   Sexual activity: Not on file  Other Topics Concern   Not on file  Social History Narrative   Not on file   Social Determinants of Health   Financial Resource Strain: Not on file  Food Insecurity: Not on file  Transportation Needs: Not on file  Physical  Activity: Not on file  Stress: Not on file  Social Connections: Not on file  Intimate Partner Violence: Not on file     Constitutional: Patient reports intermittent headaches.  Denies fever, malaise, fatigue, or abrupt weight changes.  HEENT: Denies eye pain, eye redness, ear pain, ringing in the ears, wax buildup, runny nose, nasal congestion, bloody nose, or sore throat. Respiratory: Denies difficulty breathing, shortness of breath, cough or sputum production.   Cardiovascular: Denies chest pain, chest tightness, palpitations or swelling in the hands or feet.  Gastrointestinal: Denies abdominal pain, bloating, constipation, diarrhea or blood in the stool.  GU: Denies urgency, frequency, pain with urination, burning sensation, blood in urine, odor or discharge. Musculoskeletal: Patient reports joint pain.  Denies decrease in range of motion, difficulty  with gait, muscle pain or joint swelling.  Skin: Patient reports multiple skin lesions of face along with discoloration.  Denies redness, rashes, or ulcercations.  Neurological: Denies dizziness, difficulty with memory, difficulty with speech or problems with balance and coordination.  Psych: Patient has a history of anxiety and depression.  Denies SI/HI.  No other specific complaints in a complete review of systems (except as listed in HPI above).  Objective:   Physical Exam BP 126/80 (BP Location: Left Arm, Patient Position: Sitting, Cuff Size: Large)   Pulse 73   Temp (!) 96.9 F (36.1 C) (Temporal)   Ht 5\' 4"  (1.626 m)   Wt 188 lb (85.3 kg)   SpO2 96%   BMI 32.27 kg/m   Wt Readings from Last 3 Encounters:  12/12/22 183 lb (83 kg)  08/16/22 174 lb (78.9 kg)  08/08/22 168 lb (76.2 kg)    General: Appears her stated age, obese, in NAD. Skin: Warm, dry and intact.  Skin tag noted at outer canthus of left eye.  Multiple cholesteatomas noted of forehead. HEENT: Head: normal shape and size; Eyes: sclera white, no icterus, conjunctiva pink, PERRLA and EOMs intact;  Neck:  Neck supple, trachea midline. No masses, lumps or thyromegaly present.  Cardiovascular: Normal rate and rhythm. S1,S2 noted.  No murmur, rubs or gallops noted. No JVD or BLE edema.  Pulmonary/Chest: Normal effort and positive vesicular breath sounds. No respiratory distress. No wheezes, rales or ronchi noted.  Abdomen: Normal bowel sounds.  Musculoskeletal: Strength 5/5 BUE/BLE.  No difficulty with gait.  Neurological: Alert and oriented. Cranial nerves II-XII grossly intact. Coordination normal.  Psychiatric: Mood and affect normal. Behavior is normal. Judgment and thought content normal.    BMET    Component Value Date/Time   NA 139 12/12/2022 0921   NA 140 02/15/2022 0951   K 4.3 12/12/2022 0921   CL 103 12/12/2022 0921   CO2 25 12/12/2022 0921   GLUCOSE 82 12/12/2022 0921   BUN 12 12/12/2022 0921   BUN  9 02/15/2022 0951   CREATININE 0.94 12/12/2022 0921   CALCIUM 9.8 12/12/2022 0921   GFRNONAA >60 07/27/2022 0432   GFRNONAA 84 12/29/2019 1031   GFRAA 97 12/29/2019 1031    Lipid Panel     Component Value Date/Time   CHOL 146 03/19/2023 0809   TRIG 437 (H) 03/19/2023 0809   HDL 34 (L) 03/19/2023 0809   CHOLHDL 4.3 03/19/2023 0809   LDLCALC  03/19/2023 0809     Comment:     . LDL cholesterol not calculated. Triglyceride levels greater than 400 mg/dL invalidate calculated LDL results. . Reference range: <100 . Desirable range <100 mg/dL for primary prevention;   <70 mg/dL for  patients with CHD or diabetic patients  with > or = 2 CHD risk factors. Marland Kitchen LDL-C is now calculated using the Martin-Hopkins  calculation, which is a validated novel method providing  better accuracy than the Friedewald equation in the  estimation of LDL-C.  Horald Pollen et al. Lenox Ahr. 5784;696(29): 2061-2068  (http://education.[facility].com/faq/FAQ164)     CBC    Component Value Date/Time   WBC 9.8 12/12/2022 0921   RBC 4.56 12/12/2022 0921   HGB 13.5 12/12/2022 0921   HCT 39.8 12/12/2022 0921   PLT 276 12/12/2022 0921   MCV 87.3 12/12/2022 0921   MCH 29.6 12/12/2022 0921   MCHC 33.9 12/12/2022 0921   RDW 13.2 12/12/2022 0921   LYMPHSABS 2.0 07/27/2022 0432   MONOABS 0.7 07/27/2022 0432   EOSABS 0.5 07/27/2022 0432   BASOSABS 0.1 07/27/2022 0432    Hgb A1C Lab Results  Component Value Date   HGBA1C 5.6 12/12/2022           Assessment & Plan:   Preventative health maintenance:  Encouraged her to get a flu shot in the fall Tetanus UTD Encouraged her to get her COVID booster Pap smear UTD Mammogram ordered-she will call to schedule Colon screening UTD Encouraged her to consume a balanced diet and exercise regimen Advised her to see an eye doctor and dentist annually We will check CBC, c-Met, lipid, A1c today  Vitamin D deficiency:   Will check vitamin D today She does  not want to take vitamin D daily but would prefer to take it weekly  Skin lesions of face:  Referral to dermatology for general skin check  RTC in 6 months, follow-up chronic conditions Nicki Reaper, NP

## 2023-06-18 NOTE — Patient Instructions (Signed)

## 2023-06-19 ENCOUNTER — Encounter: Payer: Self-pay | Admitting: Internal Medicine

## 2023-06-19 LAB — LIPID PANEL
Cholesterol: 139 mg/dL (ref <200)
HDL: 34 mg/dL — ABNORMAL LOW (ref >=50)
LDL Cholesterol (Calc): 73 mg/dL
Non-HDL Cholesterol (Calc): 105 mg/dL (ref <130)
Total CHOL/HDL Ratio: 4.1 (calc) (ref <5.0)
Triglycerides: 220 mg/dL — ABNORMAL HIGH (ref <150)

## 2023-06-19 LAB — COMPLETE METABOLIC PANEL WITH GFR
AG Ratio: 1.7 (calc) (ref 1.0–2.5)
ALT: 18 U/L (ref 6–29)
AST: 12 U/L (ref 10–35)
Albumin: 4.3 g/dL (ref 3.6–5.1)
Alkaline phosphatase (APISO): 79 U/L (ref 31–125)
BUN: 10 mg/dL (ref 7–25)
CO2: 23 mmol/L (ref 20–32)
Calcium: 9.2 mg/dL (ref 8.6–10.2)
Chloride: 107 mmol/L (ref 98–110)
Creat: 0.95 mg/dL (ref 0.50–0.99)
Globulin: 2.6 g/dL (ref 1.9–3.7)
Glucose, Bld: 100 mg/dL — ABNORMAL HIGH (ref 65–99)
Potassium: 4.2 mmol/L (ref 3.5–5.3)
Sodium: 139 mmol/L (ref 135–146)
Total Bilirubin: 0.4 mg/dL (ref 0.2–1.2)
Total Protein: 6.9 g/dL (ref 6.1–8.1)
eGFR: 74 mL/min/{1.73_m2} (ref >=60)

## 2023-06-19 LAB — VITAMIN D 25 HYDROXY (VIT D DEFICIENCY, FRACTURES): Vit D, 25-Hydroxy: 49 ng/mL (ref 30–100)

## 2023-06-19 LAB — CBC
HCT: 39 % (ref 35.0–45.0)
Hemoglobin: 12.9 g/dL (ref 11.7–15.5)
MCH: 29.1 pg (ref 27.0–33.0)
MCHC: 33.1 g/dL (ref 32.0–36.0)
MCV: 87.8 fL (ref 80.0–100.0)
MPV: 10 fL (ref 7.5–12.5)
Platelets: 237 10*3/uL (ref 140–400)
RBC: 4.44 10*6/uL (ref 3.80–5.10)
RDW: 12.8 % (ref 11.0–15.0)
WBC: 8.9 10*3/uL (ref 3.8–10.8)

## 2023-06-19 LAB — HEMOGLOBIN A1C
Hgb A1c MFr Bld: 5.8 %{Hb} — ABNORMAL HIGH (ref <5.7)
Mean Plasma Glucose: 120 mg/dL
eAG (mmol/L): 6.6 mmol/L

## 2023-06-19 MED ORDER — VITAMIN D (ERGOCALCIFEROL) 1.25 MG (50000 UNIT) PO CAPS
50000.0000 [IU] | ORAL_CAPSULE | ORAL | 0 refills | Status: DC
Start: 2023-06-19 — End: 2023-12-03

## 2023-06-19 NOTE — Addendum Note (Signed)
Addended by: Lorre Munroe on: 06/19/2023 08:49 AM   Modules accepted: Orders

## 2023-06-20 MED ORDER — ATORVASTATIN CALCIUM 80 MG PO TABS: 80.0000 mg | ORAL_TABLET | Freq: Every day | ORAL | 1 refills | Status: DC

## 2023-06-27 ENCOUNTER — Other Ambulatory Visit: Payer: Self-pay | Admitting: Internal Medicine

## 2023-06-28 NOTE — Telephone Encounter (Signed)
Requested Prescriptions  Pending Prescriptions Disp Refills   atorvastatin (LIPITOR) 20 MG tablet [Pharmacy Med Name: ATORVASTATIN 20MG  TABLETS] 90 tablet 1    Sig: TAKE 1 TABLET(20 MG) BY MOUTH DAILY     Cardiovascular:  Antilipid - Statins Failed - 06/27/2023  7:07 AM      Failed - Lipid Panel in normal range within the last 12 months    Cholesterol  Date Value Ref Range Status  06/18/2023 139 <200 mg/dL Final   LDL Cholesterol (Calc)  Date Value Ref Range Status  06/18/2023 73 mg/dL (calc) Final    Comment:    Reference range: <100 . Desirable range <100 mg/dL for primary prevention;   <70 mg/dL for patients with CHD or diabetic patients  with > or = 2 CHD risk factors. Marland Kitchen LDL-C is now calculated using the Martin-Hopkins  calculation, which is a validated novel method providing  better accuracy than the Friedewald equation in the  estimation of LDL-C.  Horald Pollen et al. Lenox Ahr. 1610;960(45): 2061-2068  (http://education.[facility].com/faq/FAQ164)    HDL  Date Value Ref Range Status  06/18/2023 34 (L) > OR = 50 mg/dL Final   Triglycerides  Date Value Ref Range Status  06/18/2023 220 (H) <150 mg/dL Final    Comment:    . If a non-fasting specimen was collected, consider repeat triglyceride testing on a fasting specimen if clinically indicated.  Perry Mount et al. J. of Clin. Lipidol. 2015;9:129-169. Marland Kitchen          Passed - Patient is not pregnant      Passed - Valid encounter within last 12 months    Recent Outpatient Visits           1 week ago Encounter for general adult medical examination with abnormal findings   [place] [facility], Salvadore Oxford, NP   6 months ago Aortic atherosclerosis [facility])   [place] [facility] [place], Salvadore Oxford, NP   10 months ago Colon perforation [facility])   [place] [facility] [place], Salvadore Oxford, NP   1 year ago Encounter for general adult medical examination with abnormal findings    [place] [facility] [place], Salvadore Oxford, NP   1 year ago Mixed hyperlipidemia   [place] [facility] [place], Salvadore Oxford, NP       Future Appointments             In 5 months Baity, Salvadore Oxford, NP [place] [facility]             citalopram (CELEXA) 20 MG tablet [Pharmacy Med Name: CITALOPRAM 20MG  TABLETS] 90 tablet 0    Sig: TAKE 1 TABLET(20 MG) BY MOUTH DAILY     Psychiatry:  Antidepressants - SSRI Passed - 06/27/2023  7:07 AM      Passed - Completed PHQ-2 or PHQ-9 in the last 360 days      Passed - Valid encounter within last 6 months    Recent Outpatient Visits           1 week ago Encounter for general adult medical examination with abnormal findings   [place] [facility] [place], Salvadore Oxford, NP   6 months ago Aortic atherosclerosis [facility])   [place] [facility] [place], Salvadore Oxford, NP   10 months ago Colon perforation [facility])   [place] [facility] [place], Salvadore Oxford, NP   1  year ago Encounter for general adult medical examination with abnormal findings   [place] [facility] [place], Salvadore Oxford, NP   1 year ago Mixed hyperlipidemia   [place] [facility] [place], Salvadore Oxford, NP       Future Appointments             In 5 months Baity, Salvadore Oxford, NP [facility]             gabapentin (NEURONTIN) 300 MG capsule [place] Med Name: GABAPENTIN 300MG  CAPSULES] 180 capsule 0    Sig: TAKE 1 CAPSULE(300 MG) BY MOUTH TWICE DAILY     Neurology: Anticonvulsants - gabapentin Passed - 06/27/2023  7:07 AM      Passed - Cr in normal range and within 360 days    Creat  Date Value Ref Range Status  06/18/2023 0.95 0.50 - 0.99 mg/dL Final         Passed - Completed PHQ-2 or PHQ-9 in the last 360 days      Passed - Valid encounter within last 12 months    Recent Outpatient Visits           1 week ago  Encounter for general adult medical examination with abnormal findings   [place] [facility] [place], Salvadore Oxford, NP   6 months ago Aortic atherosclerosis [facility])   [place] [facility] [place], Salvadore Oxford, NP   10 months ago Colon perforation [facility])   [place] [facility] [place], Salvadore Oxford, NP   1 year ago Encounter for general adult medical examination with abnormal findings   [place] [facility] [place], Salvadore Oxford, NP   1 year ago Mixed hyperlipidemia   [place] [facility] [place], Salvadore Oxford, NP       Future Appointments             In 5 months Baity, Salvadore Oxford, NP [place] [facility], [facility]

## 2023-07-04 ENCOUNTER — Other Ambulatory Visit: Payer: Self-pay | Admitting: Internal Medicine

## 2023-07-05 NOTE — Telephone Encounter (Signed)
Pharmacy did not receive. Requested Prescriptions  Pending Prescriptions Disp Refills   citalopram (CELEXA) 20 MG tablet [Pharmacy Med Name: CITALOPRAM 20MG  TABLETS] 90 tablet 1    Sig: TAKE 1 TABLET(20 MG) BY MOUTH DAILY     Psychiatry:  Antidepressants - SSRI Passed - 07/04/2023  8:47 AM      Passed - Completed PHQ-2 or PHQ-9 in the last 360 days      Passed - Valid encounter within last 6 months    Recent Outpatient Visits           2 weeks ago Encounter for general adult medical examination with abnormal findings   [place] [facility] [place], Salvadore Oxford, NP   6 months ago Aortic atherosclerosis [facility])   [place] [facility] [place], Salvadore Oxford, NP   11 months ago Colon perforation [facility])   [place] [facility] [place], Salvadore Oxford, NP   1 year ago Encounter for general adult medical examination with abnormal findings   [place] [facility] [place], Salvadore Oxford, NP   1 year ago Mixed hyperlipidemia   [place] [facility] [place], Salvadore Oxford, NP       Future Appointments             In 5 months Terri Piedra, DO [facility]   In 5 months Baity, Salvadore Oxford, NP [place] [facility], [facility]

## 2023-07-14 ENCOUNTER — Other Ambulatory Visit: Payer: Self-pay | Admitting: Internal Medicine

## 2023-07-14 DIAGNOSIS — I10 Essential (primary) hypertension: Secondary | ICD-10-CM

## 2023-07-15 MED ORDER — LOSARTAN POTASSIUM 100 MG PO TABS: ORAL_TABLET | ORAL | 0 refills | Status: DC

## 2023-07-18 ENCOUNTER — Ambulatory Visit
Admission: RE | Admit: 2023-07-18 | Discharge: 2023-07-18 | Disposition: A | Discharge status: Home / Self Care | Payer: BC Managed Care – PPO | Source: Ambulatory Visit | Attending: Internal Medicine | Admitting: Internal Medicine

## 2023-07-18 DIAGNOSIS — Z1231 Encounter for screening mammogram for malignant neoplasm of breast: Secondary | ICD-10-CM | POA: Insufficient documentation

## 2023-09-14 DIAGNOSIS — H6692 Otitis media, unspecified, left ear: Secondary | ICD-10-CM | POA: Diagnosis not present

## 2023-09-14 DIAGNOSIS — H6123 Impacted cerumen, bilateral: Secondary | ICD-10-CM | POA: Diagnosis not present

## 2023-12-03 ENCOUNTER — Other Ambulatory Visit: Payer: Self-pay | Admitting: Internal Medicine

## 2023-12-03 DIAGNOSIS — E559 Vitamin D deficiency, unspecified: Secondary | ICD-10-CM

## 2023-12-04 MED ORDER — VITAMIN D (ERGOCALCIFEROL) 1.25 MG (50000 UNIT) PO CAPS: 50000.0000 [IU] | ORAL_CAPSULE | ORAL | 0 refills | Status: AC

## 2023-12-05 ENCOUNTER — Encounter: Payer: Self-pay | Admitting: Dermatology

## 2023-12-05 ENCOUNTER — Ambulatory Visit: Payer: BC Managed Care – PPO | Admitting: Dermatology

## 2023-12-05 VITALS — BP 132/86

## 2023-12-05 DIAGNOSIS — L814 Other melanin hyperpigmentation: Secondary | ICD-10-CM | POA: Diagnosis not present

## 2023-12-05 DIAGNOSIS — D1801 Hemangioma of skin and subcutaneous tissue: Secondary | ICD-10-CM

## 2023-12-05 DIAGNOSIS — W908XXA Exposure to other nonionizing radiation, initial encounter: Secondary | ICD-10-CM

## 2023-12-05 DIAGNOSIS — Z1283 Encounter for screening for malignant neoplasm of skin: Secondary | ICD-10-CM | POA: Diagnosis not present

## 2023-12-05 DIAGNOSIS — L738 Other specified follicular disorders: Secondary | ICD-10-CM

## 2023-12-05 DIAGNOSIS — D229 Melanocytic nevi, unspecified: Secondary | ICD-10-CM

## 2023-12-05 DIAGNOSIS — L821 Other seborrheic keratosis: Secondary | ICD-10-CM

## 2023-12-05 DIAGNOSIS — L578 Other skin changes due to chronic exposure to nonionizing radiation: Secondary | ICD-10-CM

## 2023-12-05 NOTE — Patient Instructions (Addendum)
 Hello Sonny,  Thank you for visiting my office today. Your dedication to maintaining your skin health and addressing your concerns is greatly appreciated. Below is a summary of our discussion and the recommendations for your care:  Skin Examination: A full skin check was conducted, revealing no signs of skin cancer, which is excellent news.  Sebaceous Hyperplasia:   Treatment Option: Discussed the option to remove these lesions from your face.   Cost: Starts at $150 for 15 lesions, with the price increasing based on the number removed. A quote can be provided for removing a few noticeable lesions versus all of them. Full Face: $500  Seborrheic Keratoses:   Observation: These are non-dangerous and can be left alone unless they become bothersome.   Treatment Option: If they become itchy or you dislike their appearance, we can freeze them off.  Skin Care Recommendations:   Moisturizer: Use a light moisturizer with hyaluronic acid to hydrate without adding oil. [employer] offers a suitable product available at [facility] or [employer].   Cleanser: Employ a foaming cleanser to help control oil production.   Makeup: Select a foundation designed for oily skin with a matte finish.   Prescription Retinoid: Consider using a prescription retinoid for anti-aging benefits and to slow down or prevent the return of sebaceous hyperplasia.  Follow-Up: Schedule a follow-up in one year, or sooner if you decide to proceed with the removal procedure. A detailed quote will be provided in your after-visit summary.  Educational materials on the recommended products will be included in your summary. Please do not hesitate to reach out if you have any further questions or need additional guidance.  Warm regards,  Dr. Delon Lenis Dermatology       Important Information  Due to recent changes in healthcare laws, you may see results of your pathology and/or laboratory studies on MyChart before the doctors have had  a chance to review them. We understand that in some cases there may be results that are confusing or concerning to you. Please understand that not all results are received at the same time and often the doctors may need to interpret multiple results in order to provide you with the best plan of care or course of treatment. Therefore, we ask that you please give us  2 business days to thoroughly review all your results before contacting the office for clarification. Should we see a critical lab result, you will be contacted sooner.   If You Need Anything After Your Visit  If you have any questions or concerns for your doctor, please call our main line at [phone] If no one answers, please leave a voicemail as directed and we will return your call as soon as possible. Messages left after 4 pm will be answered the following business day.   You may also send us  a message via MyChart. We typically respond to MyChart messages within 1-2 business days.  For prescription refills, please ask your pharmacy to contact our office. Our fax number is [phone].  If you have an urgent issue when the clinic is closed that cannot wait until the next business day, you can page your doctor at the number below.    Please note that while we do our best to be available for urgent issues outside of office hours, we are not available 24/7.   If you have an urgent issue and are unable to reach us , you may choose to seek medical care at your doctor's office, retail clinic, urgent care center,  or emergency room.  If you have a medical emergency, please immediately call 911 or go to the emergency department. In the event of inclement weather, please call our main line at [phone] for an update on the status of any delays or closures.  Dermatology Medication Tips: Please keep the boxes that topical medications come in in order to help keep track of the instructions about where and how to use these. Pharmacies  typically print the medication instructions only on the boxes and not directly on the medication tubes.   If your medication is too expensive, please contact our office at [phone] or send us  a message through MyChart.   We are unable to tell what your co-pay for medications will be in advance as this is different depending on your insurance coverage. However, we may be able to find a substitute medication at lower cost or fill out paperwork to get insurance to cover a needed medication.   If a prior authorization is required to get your medication covered by your insurance company, please allow us  1-2 business days to complete this process.  Drug prices often vary depending on where the prescription is filled and some pharmacies may offer cheaper prices.  The website www.goodrx.com contains coupons for medications through different pharmacies. The prices here do not account for what the cost may be with help from insurance (it may be cheaper with your insurance), but the website can give you the price if you did not use any insurance.  - You can print the associated coupon and take it with your prescription to the pharmacy.  - You may also stop by our office during regular business hours and pick up a GoodRx coupon card.  - If you need your prescription sent electronically to a different pharmacy, notify our office through [facility] or by phone at [phone]

## 2023-12-05 NOTE — Progress Notes (Signed)
   New Patient Visit   Subjective  Katherine Foster is a 48 y.o. female who presents for the following:  Total Body Skin Exam (TBSE)  Patient present today for new patient visit for TBSE.The patient reports she has spots, moles and lesions to be evaluated, some may be new or changing and the patient may have concern these could be cancer. Patient has previously been treated by a dermatologist but it has been years ago.Patient reports she has hx of bx on her about 22yrs ago. Patient reports family history of skin cancers. (Mother - unsure if it was cancer or a DN) Patient reports throughout her lifetime has had minimal sun exposure. Currently, patient reports if she has excessive sun exposure, she does apply sunscreen and/or wears protective coverings.   The following portions of the chart were reviewed this encounter and updated as appropriate: medications, allergies, medical history  Review of Systems:  No other skin or systemic complaints except as noted in HPI or Assessment and Plan.  Objective  Well appearing patient in no apparent distress; mood and affect are within normal limits.  A full examination was performed including scalp, head, eyes, ears, nose, lips, neck, chest, axillae, abdomen, back, buttocks, bilateral upper extremities, bilateral lower extremities, hands, feet, fingers, toes, fingernails, and toenails. All findings within normal limits unless otherwise noted below.    Relevant exam findings are noted in the Assessment and Plan.    Assessment & Plan   LENTIGINES, SEBORRHEIC KERATOSES, HEMANGIOMAS - Benign normal skin lesions - Benign-appearing - Call for any changes  BENIGN MELANOCYTIC NEVI - Tan-brown and/or pink-flesh-colored symmetric macules and papules - Benign appearing on exam today - Observation - Call clinic for new or changing moles - Recommend daily use of broad spectrum spf 30+ sunscreen to sun-exposed areas.   MILD ACTINIC DAMAGE - Chronic  condition, secondary to cumulative UV/sun exposure - diffuse scaly erythematous macules with underlying dyspigmentation - Recommend daily broad spectrum sunscreen SPF 30+ to sun-exposed areas, reapply every 2 hours as needed.  - Staying in the shade or wearing long sleeves, sun glasses (UVA+UVB protection) and wide brim hats (4-inch brim around the entire circumference of the hat) are also recommended for sun protection.  - Call for new or changing lesions.  Sebaceous Hyperplasia - Small yellow papules with a central dell located on face - Benign-appearing - Observe. Call for changes. -Cosmetic Quote: Starts at $150 for 15 lesions, with the price increasing based on the number removed. A quote can be provided for removing a few noticeable lesions versus all of them. Full Face: $500  SKIN CANCER SCREENING PERFORMED TODAY  SKIN EXAM FOR MALIGNANT NEOPLASM   MULTIPLE BENIGN MELANOCYTIC NEVI   CHERRY ANGIOMA   LENTIGINES   SEBORRHEIC KERATOSIS   ACTINIC SKIN DAMAGE   SEBACEOUS HYPERPLASIA OF FACE    Return in about 1 year (around 12/04/2024) for TBSE.   Documentation: I have reviewed the above documentation for accuracy and completeness, and I agree with the above.  I, Shirron Maranda, CMA, am acting as scribe for [employer], DO.   Delon Lenis, DO

## 2023-12-14 ENCOUNTER — Other Ambulatory Visit: Payer: Self-pay | Admitting: Internal Medicine

## 2023-12-16 NOTE — Telephone Encounter (Signed)
Requested Prescriptions  Pending Prescriptions Disp Refills   atorvastatin (LIPITOR) 80 MG tablet [Pharmacy Med Name: ATORVASTATIN 80MG  TABLETS] 90 tablet 1    Sig: TAKE 1 TABLET(80 MG) BY MOUTH DAILY     Cardiovascular:  Antilipid - Statins Failed - 12/16/2023  2:17 PM      Failed - Lipid Panel in normal range within the last 12 months    Cholesterol  Date Value Ref Range Status  06/18/2023 139 <200 mg/dL Final   LDL Cholesterol (Calc)  Date Value Ref Range Status  06/18/2023 73 mg/dL (calc) Final    Comment:    Reference range: <100 . Desirable range <100 mg/dL for primary prevention;   <70 mg/dL for patients with CHD or diabetic patients  with > or = 2 CHD risk factors. Marland Kitchen LDL-C is now calculated using the Martin-Hopkins  calculation, which is a validated novel method providing  better accuracy than the Friedewald equation in the  estimation of LDL-C.  Horald Pollen et al. Lenox Ahr. 8295;621(30): 2061-2068  (http://education.[facility].com/faq/FAQ164)    HDL  Date Value Ref Range Status  06/18/2023 34 (L) > OR = 50 mg/dL Final   Triglycerides  Date Value Ref Range Status  06/18/2023 220 (H) <150 mg/dL Final    Comment:    . If a non-fasting specimen was collected, consider repeat triglyceride testing on a fasting specimen if clinically indicated.  Perry Mount et al. J. of Clin. Lipidol. 2015;9:129-169. Marland Kitchen          Passed - Patient is not pregnant      Passed - Valid encounter within last 12 months    Recent Outpatient Visits           6 months ago Encounter for general adult medical examination with abnormal findings   [place] [facility] [place], Salvadore Oxford, NP   1 year ago Aortic atherosclerosis [facility])   [place] [facility] [place], Salvadore Oxford, NP   1 year ago Colon perforation [facility])   [place] [facility] [place], Salvadore Oxford, NP   1 year ago Encounter for general adult medical examination with abnormal findings    [place] [facility] [place], Salvadore Oxford, NP   2 years ago Mixed hyperlipidemia   [place] [facility] [place], Salvadore Oxford, NP       Future Appointments             In 3 days [place], Salvadore Oxford, NP [place] [facility], [facility]

## 2023-12-19 ENCOUNTER — Ambulatory Visit: Payer: BC Managed Care – PPO | Admitting: Internal Medicine

## 2023-12-24 ENCOUNTER — Ambulatory Visit: Payer: Self-pay | Admitting: Internal Medicine

## 2023-12-24 ENCOUNTER — Other Ambulatory Visit: Payer: Self-pay | Admitting: Internal Medicine

## 2023-12-24 DIAGNOSIS — I10 Essential (primary) hypertension: Secondary | ICD-10-CM

## 2023-12-25 NOTE — Telephone Encounter (Signed)
 Requested Prescriptions  Pending Prescriptions Disp Refills   losartan (COZAAR) 100 MG tablet [Pharmacy Med Name: LOSARTAN 100MG  TABLETS] 90 tablet 0    Sig: TAKE 1 TABLET(100 MG) BY MOUTH DAILY     Cardiovascular:  Angiotensin Receptor Blockers Failed - 12/25/2023  9:40 AM      Failed - Cr in normal range and within 180 days    Creat  Date Value Ref Range Status  06/18/2023 0.95 0.50 - 0.99 mg/dL Final         Failed - K in normal range and within 180 days    Potassium  Date Value Ref Range Status  06/18/2023 4.2 3.5 - 5.3 mmol/L Final         Failed - Valid encounter within last 6 months    Recent Outpatient Visits           6 months ago Encounter for general adult medical examination with abnormal findings   [place] [facility] [place], Salvadore Oxford, NP   1 year ago Aortic atherosclerosis [facility])   [place] [facility] [place], Salvadore Oxford, NP   1 year ago Colon perforation [facility])   [place] [facility] [place], Salvadore Oxford, NP   1 year ago Encounter for general adult medical examination with abnormal findings   [facility] [place], Salvadore Oxford, NP   2 years ago Mixed hyperlipidemia   [place] [facility] [place], Salvadore Oxford, NP       Future Appointments             In 5 days [place], Salvadore Oxford, NP  [facility], [facility]            Passed - Patient is not pregnant      Passed - Last BP in normal range    BP Readings from Last 1 Encounters:  12/05/23 132/86

## 2023-12-30 ENCOUNTER — Ambulatory Visit (INDEPENDENT_AMBULATORY_CARE_PROVIDER_SITE_OTHER): Payer: BC Managed Care – PPO | Admitting: Internal Medicine

## 2023-12-30 ENCOUNTER — Encounter: Payer: Self-pay | Admitting: Internal Medicine

## 2023-12-30 VITALS — BP 128/84 | Ht 64.0 in | Wt 189.0 lb

## 2023-12-30 DIAGNOSIS — G43839 Menstrual migraine, intractable, without status migrainosus: Secondary | ICD-10-CM

## 2023-12-30 DIAGNOSIS — F419 Anxiety disorder, unspecified: Secondary | ICD-10-CM

## 2023-12-30 DIAGNOSIS — E782 Mixed hyperlipidemia: Secondary | ICD-10-CM | POA: Diagnosis not present

## 2023-12-30 DIAGNOSIS — I10 Essential (primary) hypertension: Secondary | ICD-10-CM

## 2023-12-30 DIAGNOSIS — R7303 Prediabetes: Secondary | ICD-10-CM | POA: Insufficient documentation

## 2023-12-30 DIAGNOSIS — I700 Atherosclerosis of aorta: Secondary | ICD-10-CM | POA: Diagnosis not present

## 2023-12-30 DIAGNOSIS — E66811 Obesity, class 1: Secondary | ICD-10-CM

## 2023-12-30 DIAGNOSIS — D500 Iron deficiency anemia secondary to blood loss (chronic): Secondary | ICD-10-CM

## 2023-12-30 DIAGNOSIS — E6609 Other obesity due to excess calories: Secondary | ICD-10-CM

## 2023-12-30 DIAGNOSIS — D649 Anemia, unspecified: Secondary | ICD-10-CM | POA: Insufficient documentation

## 2023-12-30 DIAGNOSIS — F32A Depression, unspecified: Secondary | ICD-10-CM

## 2023-12-30 DIAGNOSIS — M170 Bilateral primary osteoarthritis of knee: Secondary | ICD-10-CM

## 2023-12-30 DIAGNOSIS — R053 Chronic cough: Secondary | ICD-10-CM | POA: Insufficient documentation

## 2023-12-30 NOTE — Assessment & Plan Note (Addendum)
 Encouraged diet and exercise for weight loss ?

## 2023-12-30 NOTE — Assessment & Plan Note (Signed)
C-Met and lipid profile today Encouraged her to consume a low-fat diet Continue atorvastatin 

## 2023-12-30 NOTE — Assessment & Plan Note (Signed)
 A1c today Encourage low-carb diet and exercise for weight loss

## 2023-12-30 NOTE — Progress Notes (Signed)
 Subjective:    Patient ID: Katherine Foster, female    DOB: 1976-03-27, 48 y.o.   MRN: [id number]  HPI  Patient presents to clinic today for follow-up of chronic conditions.  Chronic cough: Intermittent, managed with gabapentin as needed with good relief of symptoms.  HLD with aortic atherosclerosis: Her last LDL was 73, triglycerides 409, 05/2023.  She denies myalgias on atorvastatin.  She is taking aspirin as well.  She does not consume low-fat diet.  HTN: Her BP today is 128/84.  She is taking losartan as prescribed.  ECG from 03/2021 reviewed.  Migraines: These occur rarely, about 1 x month.  Triggered by hormones.  She takes rizatriptan as needed with good relief of symptoms.  She does not follow with neurology.  OA: Mainly in her knees.  She does not take anything OTC for this.  She does not follow with orthopedics.  Anxiety and depression: Chronic, managed on citalopram.  She is not currently seeing a therapist.  She denies SI/HI.  Anemia: Her last H/H was 12.9/39, 05/2023.  She is not taking any oral iron at this time.  She does not follow with hematology.  Prediabetes: Her last A1c was 5.8%, 05/2023.  She is not taking any oral diabetic medication at this time.  She does not check her sugars.  Review of Systems     Past Medical History:  Diagnosis Date   Chronic cough    Constipation    Hyperlipidemia    Hypertension    Joint pain    Lower extremity edema    Palpitations    SOB (shortness of breath) on exertion     Current Outpatient Medications  Medication Sig Dispense Refill   aspirin EC 81 MG tablet Take 1 tablet (81 mg total) by mouth daily. Swallow whole. 30 tablet 12   atorvastatin (LIPITOR) 80 MG tablet TAKE 1 TABLET(80 MG) BY MOUTH DAILY 90 tablet 1   citalopram (CELEXA) 20 MG tablet TAKE 1 TABLET(20 MG) BY MOUTH DAILY 90 tablet 1   gabapentin (NEURONTIN) 300 MG capsule TAKE 1 CAPSULE(300 MG) BY MOUTH TWICE DAILY 180 capsule 3   losartan (COZAAR) 100 MG  tablet TAKE 1 TABLET(100 MG) BY MOUTH DAILY 90 tablet 0   rizatriptan (MAXALT) 5 MG tablet Take 1 tablet (5 mg total) by mouth as needed for migraine. May repeat in 2 hours if needed 10 tablet 2   Semaglutide-Weight Management 0.25 MG/0.5ML SOAJ Inject 0.25 mg into the skin once a week. 2 mL 0   Vitamin D, Ergocalciferol, (DRISDOL) 1.25 MG (50000 UNIT) CAPS capsule Take 1 capsule (50,000 Units total) by mouth every 7 (seven) days. 12 capsule 0   No current facility-administered medications for this visit.    Allergies  Allergen Reactions   Sulfa Antibiotics     Family History  Problem Relation Age of Onset   Heart disease Mother    Hypertension Mother    Diabetes Mellitus II Mother    Hypothyroidism Mother    Anxiety disorder Mother    Liver disease Mother    Obesity Mother    Obesity Father    Thyroid disease Father    Hyperlipidemia Father    Hypertension Father    Heart disease Father    Heart attack Father    Diabetes Mellitus II Father    Sudden death Father    Breast cancer Maternal Grandmother    Cancer Maternal Grandmother    Diabetes Maternal Grandfather    Heart attack Paternal  Grandfather    Colon cancer Neg Hx     Social History   Socioeconomic History   Marital status: Divorced    Spouse name: Not on file   Number of children: Not on file   Years of education: Not on file   Highest education level: Not on file  Occupational History   Occupation: Finacial services officer/mortage Careers adviser  Tobacco Use   Smoking status: Never   Smokeless tobacco: Never  Vaping Use   Vaping status: Never Used  Substance and Sexual Activity   Alcohol use: Yes    Comment: occ   Drug use: Never   Sexual activity: Not on file  Other Topics Concern   Not on file  Social History Narrative   Not on file   Social Drivers of Health   Financial Resource Strain: Not on file  Food Insecurity: Not on file  Transportation Needs: Not on file  Physical Activity: Not  on file  Stress: Not on file  Social Connections: Not on file  Intimate Partner Violence: Not on file     Constitutional: Patient reports intermittent headaches.  Denies fever, malaise, fatigue, or abrupt weight changes.  HEENT:  Denies eye pain, eye redness, ear pain, ringing in the ears, wax buildup, runny nose, nasal congestion, bloody nose, or sore throat. Respiratory: Patient reports intermittent cough.  Denies difficulty breathing, shortness of breath, or sputum production.   Cardiovascular: Denies chest pain, chest tightness, palpitations or swelling in the hands or feet.  Gastrointestinal: Denies abdominal pain, bloating, constipation, diarrhea or blood in the stool.  GU: Denies urgency, frequency, pain with urination, burning sensation, blood in urine, odor or discharge. Musculoskeletal: Patient reports intermittent knee pain.  Denies decrease in range of motion, difficulty with gait, muscle pain or joint swelling.  Skin: Denies redness, rashes, lesions or ulcercations.  Neurological: Denies dizziness, difficulty with memory, difficulty with speech or problems with balance and coordination.  Psych: Patient has a history of anxiety and depression.  Denies SI/HI.  No other specific complaints in a complete review of systems (except as listed in HPI above).   Objective:   Physical Exam BP 128/84 (BP Location: Left Arm, Patient Position: Sitting, Cuff Size: Normal)   Ht 5\' 4"  (1.626 m)   Wt 189 lb (85.7 kg)   BMI 32.44 kg/m    Wt Readings from Last 3 Encounters:  06/18/23 188 lb (85.3 kg)  12/12/22 183 lb (83 kg)  08/16/22 174 lb (78.9 kg)    General: Appears her stated age, obese, in NAD. Skin: Warm, dry and intact.  HEENT: Head: normal shape and size; Eyes: sclera white, no icterus, conjunctiva pink, PERRLA and EOMs intact;  Cardiovascular: Normal rate and rhythm. S1,S2 noted.  No murmur, rubs or gallops noted. No JVD or BLE edema.  Pulmonary/Chest: Normal effort and  positive vesicular breath sounds. No respiratory distress. No wheezes, rales or ronchi noted.  Musculoskeletal: No difficulty with gait.  Neurological: Alert and oriented.  Coordination normal.  Psychiatric: Mood and affect normal. Behavior is normal. Judgment and thought content normal.     BMET    Component Value Date/Time   NA 139 06/18/2023 0859   NA 140 02/15/2022 0951   K 4.2 06/18/2023 0859   CL 107 06/18/2023 0859   CO2 23 06/18/2023 0859   GLUCOSE 100 (H) 06/18/2023 0859   BUN 10 06/18/2023 0859   BUN 9 02/15/2022 0951   CREATININE 0.95 06/18/2023 0859   CALCIUM 9.2 06/18/2023 0859  GFRNONAA >60 07/27/2022 0432   GFRNONAA 84 12/29/2019 1031   GFRAA 97 12/29/2019 1031    Lipid Panel     Component Value Date/Time   CHOL 139 06/18/2023 0859   TRIG 220 (H) 06/18/2023 0859   HDL 34 (L) 06/18/2023 0859   CHOLHDL 4.1 06/18/2023 0859   LDLCALC 73 06/18/2023 0859    CBC    Component Value Date/Time   WBC 8.9 06/18/2023 0859   RBC 4.44 06/18/2023 0859   HGB 12.9 06/18/2023 0859   HCT 39.0 06/18/2023 0859   PLT 237 06/18/2023 0859   MCV 87.8 06/18/2023 0859   MCH 29.1 06/18/2023 0859   MCHC 33.1 06/18/2023 0859   RDW 12.8 06/18/2023 0859   LYMPHSABS 2.0 07/27/2022 0432   MONOABS 0.7 07/27/2022 0432   EOSABS 0.5 07/27/2022 0432   BASOSABS 0.1 07/27/2022 0432    Hgb A1C Lab Results  Component Value Date   HGBA1C 5.8 (H) 06/18/2023            Assessment & Plan:    RTC in 6 months for your annual exam Nicki Reaper, NP

## 2023-12-30 NOTE — Assessment & Plan Note (Signed)
 Continue gabapentin as needed

## 2023-12-30 NOTE — Assessment & Plan Note (Signed)
C-Met and lipid profile today Encouraged her to consume a low-fat diet Continue atorvastatin and aspirin 

## 2023-12-30 NOTE — Assessment & Plan Note (Signed)
 CBC today.

## 2023-12-30 NOTE — Assessment & Plan Note (Signed)
Encourage weight loss as this can help reduce joint pain Okay to take Tylenol OTC as needed for joint pain

## 2023-12-30 NOTE — Assessment & Plan Note (Signed)
Controlled on losartan Reinforced DASH diet and exercise for weight loss C-Met today

## 2023-12-30 NOTE — Assessment & Plan Note (Signed)
 Continue rizatriptan as needed

## 2023-12-30 NOTE — Patient Instructions (Signed)

## 2023-12-30 NOTE — Assessment & Plan Note (Signed)
Stable on her current dose of citalopram Support offered

## 2023-12-31 ENCOUNTER — Encounter: Payer: Self-pay | Admitting: Internal Medicine

## 2023-12-31 ENCOUNTER — Other Ambulatory Visit: Payer: Self-pay | Admitting: Internal Medicine

## 2023-12-31 LAB — COMPLETE METABOLIC PANEL WITH GFR
AG Ratio: 1.5 (calc) (ref 1.0–2.5)
ALT: 17 U/L (ref 6–29)
AST: 12 U/L (ref 10–35)
Albumin: 4.1 g/dL (ref 3.6–5.1)
Alkaline phosphatase (APISO): 89 U/L (ref 31–125)
BUN: 10 mg/dL (ref 7–25)
CO2: 27 mmol/L (ref 20–32)
Calcium: 9.5 mg/dL (ref 8.6–10.2)
Chloride: 102 mmol/L (ref 98–110)
Creat: 0.8 mg/dL (ref 0.50–0.99)
Globulin: 2.7 g/dL (ref 1.9–3.7)
Glucose, Bld: 83 mg/dL (ref 65–99)
Potassium: 4.2 mmol/L (ref 3.5–5.3)
Sodium: 138 mmol/L (ref 135–146)
Total Bilirubin: 0.4 mg/dL (ref 0.2–1.2)
Total Protein: 6.8 g/dL (ref 6.1–8.1)
eGFR: 90 mL/min/{1.73_m2} (ref >=60)

## 2023-12-31 LAB — HEMOGLOBIN A1C
Hgb A1c MFr Bld: 5.9 %{Hb} — ABNORMAL HIGH (ref <5.7)
Mean Plasma Glucose: 123 mg/dL
eAG (mmol/L): 6.8 mmol/L

## 2023-12-31 LAB — LIPID PANEL
Cholesterol: 141 mg/dL (ref <200)
HDL: 41 mg/dL — ABNORMAL LOW (ref >=50)
LDL Cholesterol (Calc): 69 mg/dL
Non-HDL Cholesterol (Calc): 100 mg/dL (ref <130)
Total CHOL/HDL Ratio: 3.4 (calc) (ref <5.0)
Triglycerides: 215 mg/dL — ABNORMAL HIGH (ref <150)

## 2023-12-31 LAB — CBC
HCT: 39.3 % (ref 35.0–45.0)
Hemoglobin: 12.9 g/dL (ref 11.7–15.5)
MCH: 28.7 pg (ref 27.0–33.0)
MCHC: 32.8 g/dL (ref 32.0–36.0)
MCV: 87.5 fL (ref 80.0–100.0)
MPV: 10.1 fL (ref 7.5–12.5)
Platelets: 302 10*3/uL (ref 140–400)
RBC: 4.49 10*6/uL (ref 3.80–5.10)
RDW: 13 % (ref 11.0–15.0)
WBC: 10.9 10*3/uL — ABNORMAL HIGH (ref 3.8–10.8)

## 2024-01-01 NOTE — Telephone Encounter (Signed)
 Requested medication (s) are due for refill today- yes  Requested medication (s) are on the active medication list -yes  Future visit scheduled -yes  Last refill: 07/05/23 #90 1RF  Notes to clinic: Attempted to call patient- fails 6 month visit protocol- "call can not be completed at this time" message- do you want to refill? - appointment scheduled 06/24/24  Requested Prescriptions  Pending Prescriptions Disp Refills   citalopram (CELEXA) 20 MG tablet [Pharmacy Med Name: CITALOPRAM 20MG  TABLETS] 90 tablet 1    Sig: TAKE 1 TABLET(20 MG) BY MOUTH DAILY     Psychiatry:  Antidepressants - SSRI Failed - 01/01/2024 10:53 AM      Failed - Valid encounter within last 6 months    Recent Outpatient Visits           6 months ago Encounter for general adult medical examination with abnormal findings   [place] [facility] [place], Salvadore Oxford, NP   1 year ago Aortic atherosclerosis [place] [facility])   [place] [facility] [place], Salvadore Oxford, NP   1 year ago Colon perforation [facility])   [place] [facility] [place], Salvadore Oxford, NP   1 year ago Encounter for general adult medical examination with abnormal findings   [place] [facility] [place], Salvadore Oxford, NP   2 years ago Mixed hyperlipidemia   [place] [facility] [place], Salvadore Oxford, NP              Passed - Completed PHQ-2 or PHQ-9 in the last 360 days         Requested Prescriptions  Pending Prescriptions Disp Refills   citalopram (CELEXA) 20 MG tablet [Pharmacy Med Name: CITALOPRAM 20MG  TABLETS] 90 tablet 1    Sig: TAKE 1 TABLET(20 MG) BY MOUTH DAILY     Psychiatry:  Antidepressants - SSRI Failed - 01/01/2024 10:53 AM      Failed - Valid encounter within last 6 months    Recent Outpatient Visits           6 months ago Encounter for general adult medical examination with abnormal findings   [place] [facility] [place], Salvadore Oxford, NP   1 year ago  Aortic atherosclerosis [facility])   [employer] [facility] [place], Salvadore Oxford, NP   1 year ago Colon perforation [facility])   [place] [facility] [place], Salvadore Oxford, NP   1 year ago Encounter for general adult medical examination with abnormal findings   [place] [facility] [place], Salvadore Oxford, NP   2 years ago Mixed hyperlipidemia    [facility] [place], [address], [place]              Passed - Completed PHQ-2 or PHQ-9 in the last 360 days

## 2024-01-16 ENCOUNTER — Other Ambulatory Visit: Payer: Self-pay | Admitting: Internal Medicine

## 2024-01-17 NOTE — Telephone Encounter (Signed)
 Requested Prescriptions  Pending Prescriptions Disp Refills   rizatriptan (MAXALT) 5 MG tablet [Pharmacy Med Name: RIZATRIPTAN 5MG  TABLETS] 10 tablet 2    Sig: TAKE 1 TABLET BY MOUTH AS NEEDED FOR MIGRAINE. MAY REPEAT IN 2 HOURS IF NEEDED.     Neurology:  Migraine Therapy - Triptan Passed - 01/17/2024  3:42 PM      Passed - Last BP in normal range    BP Readings from Last 1 Encounters:  12/30/23 128/84         Passed - Valid encounter within last 12 months    Recent Outpatient Visits           7 months ago Encounter for general adult medical examination with abnormal findings   [place] [facility] [place], Salvadore Oxford, NP   1 year ago Aortic atherosclerosis [facility])   [place] [facility] [place], Salvadore Oxford, NP   1 year ago Colon perforation [facility])   [place] [facility] [place], Salvadore Oxford, NP   1 year ago Encounter for general adult medical examination with abnormal findings   [place] [facility] [place], Salvadore Oxford, NP   2 years ago Mixed hyperlipidemia   [place] [facility] [place], Salvadore Oxford, [place]

## 2024-04-23 ENCOUNTER — Other Ambulatory Visit: Payer: Self-pay | Admitting: Internal Medicine

## 2024-04-23 DIAGNOSIS — I10 Essential (primary) hypertension: Secondary | ICD-10-CM

## 2024-04-24 NOTE — Telephone Encounter (Signed)
 Requested Prescriptions  Pending Prescriptions Disp Refills   losartan  (COZAAR ) 100 MG tablet [Pharmacy Med Name: LOSARTAN  100MG  TABLETS] 90 tablet 0    Sig: TAKE 1 TABLET(100 MG) BY MOUTH DAILY     Cardiovascular:  Angiotensin Receptor Blockers Passed - 04/24/2024  3:07 PM      Passed - Cr in normal range and within 180 days    Creat  Date Value Ref Range Status  12/30/2023 0.80 0.50 - 0.99 mg/dL Final         Passed - K in normal range and within 180 days    Potassium  Date Value Ref Range Status  12/30/2023 4.2 3.5 - 5.3 mmol/L Final         Passed - Patient is not pregnant      Passed - Last BP in normal range    BP Readings from Last 1 Encounters:  12/30/23 128/84         Passed - Valid encounter within last 6 months    Recent Outpatient Visits           3 months ago Mixed hyperlipidemia   [place] [facility] [place], [place], [place]

## 2024-06-06 ENCOUNTER — Other Ambulatory Visit: Payer: Self-pay | Admitting: Internal Medicine

## 2024-06-10 ENCOUNTER — Other Ambulatory Visit: Payer: Self-pay

## 2024-06-10 MED ORDER — ATORVASTATIN CALCIUM 80 MG PO TABS: 80.0000 mg | ORAL_TABLET | Freq: Every day | ORAL | 1 refills | Status: AC

## 2024-06-21 ENCOUNTER — Other Ambulatory Visit: Payer: Self-pay | Admitting: Internal Medicine

## 2024-06-21 DIAGNOSIS — I10 Essential (primary) hypertension: Secondary | ICD-10-CM

## 2024-06-22 NOTE — Telephone Encounter (Signed)
 Requested Prescriptions  Refused Prescriptions Disp Refills   losartan  (COZAAR ) 100 MG tablet [Pharmacy Med Name: LOSARTAN  100MG  TABLETS] 90 tablet 0    Sig: TAKE 1 TABLET(100 MG) BY MOUTH DAILY     Cardiovascular:  Angiotensin Receptor Blockers Passed - 06/22/2024  4:32 PM      Passed - Cr in normal range and within 180 days    Creat  Date Value Ref Range Status  12/30/2023 0.80 0.50 - 0.99 mg/dL Final         Passed - K in normal range and within 180 days    Potassium  Date Value Ref Range Status  12/30/2023 4.2 3.5 - 5.3 mmol/L Final         Passed - Patient is not pregnant      Passed - Last BP in normal range    BP Readings from Last 1 Encounters:  12/30/23 128/84         Passed - Valid encounter within last 6 months    Recent Outpatient Visits           5 months ago Mixed hyperlipidemia    [facility] [place], [place], [place]

## 2024-06-24 ENCOUNTER — Encounter: Payer: Self-pay | Admitting: Internal Medicine

## 2024-06-24 ENCOUNTER — Ambulatory Visit (INDEPENDENT_AMBULATORY_CARE_PROVIDER_SITE_OTHER): Admitting: Internal Medicine

## 2024-06-24 ENCOUNTER — Other Ambulatory Visit (HOSPITAL_COMMUNITY)
Admission: RE | Admit: 2024-06-24 | Discharge: 2024-06-24 | Disposition: A | Discharge status: Home / Self Care | Source: Ambulatory Visit | Attending: Internal Medicine | Admitting: Internal Medicine

## 2024-06-24 VITALS — BP 118/74 | Ht 64.0 in | Wt 190.0 lb

## 2024-06-24 DIAGNOSIS — F419 Anxiety disorder, unspecified: Secondary | ICD-10-CM

## 2024-06-24 DIAGNOSIS — E559 Vitamin D deficiency, unspecified: Secondary | ICD-10-CM | POA: Diagnosis not present

## 2024-06-24 DIAGNOSIS — E782 Mixed hyperlipidemia: Secondary | ICD-10-CM

## 2024-06-24 DIAGNOSIS — Z0001 Encounter for general adult medical examination with abnormal findings: Secondary | ICD-10-CM | POA: Diagnosis not present

## 2024-06-24 DIAGNOSIS — R7303 Prediabetes: Secondary | ICD-10-CM

## 2024-06-24 DIAGNOSIS — Z6832 Body mass index (BMI) 32.0-32.9, adult: Secondary | ICD-10-CM

## 2024-06-24 DIAGNOSIS — Z124 Encounter for screening for malignant neoplasm of cervix: Secondary | ICD-10-CM

## 2024-06-24 DIAGNOSIS — F32A Depression, unspecified: Secondary | ICD-10-CM

## 2024-06-24 DIAGNOSIS — E66811 Obesity, class 1: Secondary | ICD-10-CM

## 2024-06-24 DIAGNOSIS — Z1231 Encounter for screening mammogram for malignant neoplasm of breast: Secondary | ICD-10-CM

## 2024-06-24 DIAGNOSIS — E6609 Other obesity due to excess calories: Secondary | ICD-10-CM

## 2024-06-24 DIAGNOSIS — Z8349 Family history of other endocrine, nutritional and metabolic diseases: Secondary | ICD-10-CM

## 2024-06-24 MED ORDER — BUSPIRONE HCL 5 MG PO TABS: 5.0000 mg | ORAL_TABLET | Freq: Two times a day (BID) | ORAL | 1 refills | Status: AC

## 2024-06-24 NOTE — Assessment & Plan Note (Signed)
 Deteriorated Continue citalopram  20 mg daily Will add buspirone  5 mg twice daily Support offered

## 2024-06-24 NOTE — Patient Instructions (Signed)

## 2024-06-24 NOTE — Assessment & Plan Note (Signed)
 Encouraged diet and exercise for weight loss ?

## 2024-06-24 NOTE — Progress Notes (Signed)
 Subjective:    Patient ID: Karys Meckley, female    DOB: 1976/01/18, 48 y.o.   MRN: [id number]  HPI  Patient presents the clinic today for her annual exam.  She does report being under a lot of situational stress lately.  Her mother has recently been diagnosed with dementia and lives with her.  She is her full-time caregiver.  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  She has history of anxiety and depression is currently taking citalopram  20 mg daily.  She is not currently seeing a therapist.  Flu: 07/2021 Tetanus: 07/2018 COVID: x 2 Pap smear: 01/2020 Mammogram: 06/2023 Colon screening: 06/2022 Vision screening: annually Dentist: biannually  Diet: She does eat meat. She consumes fruits and veggies. She does eat some fried foods. She drinks mostly sweet tea, soda and water .  Exercise: None  Review of Systems     Past Medical History:  Diagnosis Date  . Chronic cough   . Constipation   . Hyperlipidemia   . Hypertension   . Joint pain   . Lower extremity edema   . Palpitations   . SOB (shortness of breath) on exertion     Current Outpatient Medications  Medication Sig Dispense Refill  . aspirin  EC 81 MG tablet Take 1 tablet (81 mg total) by mouth daily. Swallow whole. 30 tablet 12  . atorvastatin  (LIPITOR) 80 MG tablet Take 1 tablet (80 mg total) by mouth daily. 90 tablet 1  . citalopram  (CELEXA ) 20 MG tablet TAKE 1 TABLET(20 MG) BY MOUTH DAILY 90 tablet 1  . gabapentin  (NEURONTIN ) 300 MG capsule TAKE 1 CAPSULE(300 MG) BY MOUTH TWICE DAILY 180 capsule 3  . losartan  (COZAAR ) 100 MG tablet TAKE 1 TABLET(100 MG) BY MOUTH DAILY 90 tablet 0  . rizatriptan  (MAXALT ) 5 MG tablet TAKE 1 TABLET BY MOUTH AS NEEDED FOR MIGRAINE. MAY REPEAT IN 2 HOURS IF NEEDED. 10 tablet 2  . Vitamin D , Ergocalciferol , (DRISDOL ) 1.25 MG (50000 UNIT) CAPS capsule Take 1 capsule (50,000 Units total) by mouth every 7 (seven) days. 12 capsule 0    No current facility-administered medications for this visit.    Allergies  Allergen Reactions  . Sulfa Antibiotics     Family History  Problem Relation Age of Onset  . Heart disease Mother   . Hypertension Mother   . Diabetes Mellitus II Mother   . Hypothyroidism Mother   . Anxiety disorder Mother   . Liver disease Mother   . Obesity Mother   . Obesity Father   . Thyroid  disease Father   . Hyperlipidemia Father   . Hypertension Father   . Heart disease Father   . Heart attack Father   . Diabetes Mellitus II Father   . Sudden death Father   . Breast cancer Maternal Grandmother   . Cancer Maternal Grandmother   . Diabetes Maternal Grandfather   . Heart attack Paternal Grandfather   . Colon cancer Neg Hx     Social History   Socioeconomic History  . Marital status: Divorced    Spouse name: Not on file  . Number of children: Not on file  . Years of education: Not on file  . Highest education level: Some college, no degree  Occupational History  . Occupation: Data processing manager  Tobacco Use  . Smoking status: Never  . Smokeless tobacco: Never  Vaping Use  . Vaping status: Never Used  Substance  and Sexual Activity  . Alcohol use: Yes    Comment: occ  . Drug use: Never  . Sexual activity: Not on file  Other Topics Concern  . Not on file  Social History Narrative  . Not on file   Social Drivers of Health   Financial Resource Strain: Low Risk  (06/22/2024)   Overall Financial Resource Strain (CARDIA)   . Difficulty of Paying Living Expenses: Not hard at all  Food Insecurity: No Food Insecurity (06/22/2024)   Hunger Vital Sign   . Worried About Programme researcher, broadcasting/film/video in the Last Year: Never true   . Ran Out of Food in the Last Year: Never true  Transportation Needs: No Transportation Needs (06/22/2024)   PRAPARE - Transportation   . Lack of Transportation (Medical): No   . Lack of Transportation (Non-Medical): No  Physical  Activity: Inactive (06/22/2024)   Exercise Vital Sign   . Days of Exercise per Week: 0 days   . Minutes of Exercise per Session: Not on file  Stress: Stress Concern Present (06/22/2024)   [employer] of Occupational Health - Occupational Stress Questionnaire   . Feeling of Stress: Rather much  Social Connections: Moderately Integrated (06/22/2024)   Social Connection and Isolation Panel   . Frequency of Communication with Friends and Family: Twice a week   . Frequency of Social Gatherings with Friends and Family: Once a week   . Attends Religious Services: More than 4 times per year   . Active Member of Clubs or Organizations: Yes   . Attends Banker Meetings: 1 to 4 times per year   . Marital Status: Divorced  Catering manager Violence: Not on file     Constitutional: Patient reports intermittent headaches.  Denies fever, malaise, fatigue, or abrupt weight changes.  HEENT: Denies eye pain, eye redness, ear pain, ringing in the ears, wax buildup, runny nose, nasal congestion, bloody nose, or sore throat. Respiratory: Denies difficulty breathing, shortness of breath, cough or sputum production.   Cardiovascular: Denies chest pain, chest tightness, palpitations or swelling in the hands or feet.  Gastrointestinal: Denies abdominal pain, bloating, constipation, diarrhea or blood in the stool.  GU: Denies urgency, frequency, pain with urination, burning sensation, blood in urine, odor or discharge. Musculoskeletal: Patient reports joint pain.  Denies decrease in range of motion, difficulty with gait, muscle pain or joint swelling.  Skin: Denies redness, rashes, lesions or ulcercations.  Neurological: Denies dizziness, difficulty with memory, difficulty with speech or problems with balance and coordination.  Psych: Patient has a history of anxiety and depression.  Denies SI/HI.  No other specific complaints in a complete review of systems (except as listed in HPI  above).  Objective:   Physical Exam BP 118/74 (BP Location: Left Arm, Patient Position: Sitting, Cuff Size: Normal)   Ht 5' 4 (1.626 m)   Wt 190 lb (86.2 kg)   LMP 05/27/2024 (Approximate)   BMI 32.61 kg/m    Wt Readings from Last 3 Encounters:  12/30/23 189 lb (85.7 kg)  06/18/23 188 lb (85.3 kg)  12/12/22 183 lb (83 kg)    General: Appears her stated age, obese, in NAD. Skin: Warm, dry and intact.  Multiple cholesteatomas noted of forehead. HEENT: Head: normal shape and size; Eyes: sclera white, no icterus, conjunctiva pink, PERRLA and EOMs intact;  Neck:  Neck supple, trachea midline. No masses, lumps or thyromegaly present.  Cardiovascular: Normal rate and rhythm. S1,S2 noted.  No murmur, rubs or gallops noted.  No JVD or BLE edema.  Pulmonary/Chest: Normal effort and positive vesicular breath sounds. No respiratory distress. No wheezes, rales or ronchi noted.  Abdomen: Normal bowel sounds.  Pelvic: Normal female anatomy.  Sebaceous cyst noted of the labia majora on the left and right near the clitoris.  Her right labia minora is larger than the left.  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  Her cervix is flexed downward and difficult to visualize.  No CMT.  Adnexa nonpalpable. Musculoskeletal: Strength 5/5 BUE/BLE.  No difficulty with gait.  Neurological: Alert and oriented. Cranial nerves II-XII grossly intact. Coordination normal.  Psychiatric: Mood and affect normal. Behavior is normal. Judgment and thought content normal.    BMET    Component Value Date/Time   NA 138 12/30/2023 0933   NA 140 02/15/2022 0951   K 4.2 12/30/2023 0933   CL 102 12/30/2023 0933   CO2 27 12/30/2023 0933   GLUCOSE 83 12/30/2023 0933   BUN 10 12/30/2023 0933   BUN 9 02/15/2022 0951   CREATININE 0.80 12/30/2023 0933   CALCIUM  9.5 12/30/2023 0933   GFRNONAA >60 07/27/2022 0432   GFRNONAA 84 12/29/2019 1031   GFRAA 97 12/29/2019 1031    Lipid Panel     Component Value Date/Time   CHOL  141 12/30/2023 0933   TRIG 215 (H) 12/30/2023 0933   HDL 41 (L) 12/30/2023 0933   CHOLHDL 3.4 12/30/2023 0933   LDLCALC 69 12/30/2023 0933    CBC    Component Value Date/Time   WBC 10.9 (H) 12/30/2023 0933   RBC 4.49 12/30/2023 0933   HGB 12.9 12/30/2023 0933   HCT 39.3 12/30/2023 0933   PLT 302 12/30/2023 0933   MCV 87.5 12/30/2023 0933   MCH 28.7 12/30/2023 0933   MCHC 32.8 12/30/2023 0933   RDW 13.0 12/30/2023 0933   LYMPHSABS 2.0 07/27/2022 0432   MONOABS 0.7 07/27/2022 0432   EOSABS 0.5 07/27/2022 0432   BASOSABS 0.1 07/27/2022 0432    Hgb A1C Lab Results  Component Value Date   HGBA1C 5.9 (H) 12/30/2023           Assessment & Plan:   Preventative health maintenance:  Encouraged her to get a flu shot in the fall Tetanus UTD Encouraged her to get her COVID booster Pap smear today, she declines STD screening Mammogram ordered-she will call to schedule Colon screening UTD Encouraged her to consume a balanced diet and exercise regimen Advised her to see an eye doctor and dentist annually We will check CBC, c-Met, lipid, A1c today  Vitamin D  deficiency:   Will check vitamin D  today She does not want to take vitamin D  daily but would prefer to take it weekly   RTC in 6 months, follow-up chronic conditions Angeline Laura, NP

## 2024-06-25 ENCOUNTER — Ambulatory Visit: Payer: Self-pay | Admitting: Internal Medicine

## 2024-06-25 LAB — COMPREHENSIVE METABOLIC PANEL WITH GFR
AG Ratio: 1.6 (calc) (ref 1.0–2.5)
ALT: 16 U/L (ref 6–29)
AST: 11 U/L (ref 10–35)
Albumin: 4.1 g/dL (ref 3.6–5.1)
Alkaline phosphatase (APISO): 81 U/L (ref 31–125)
BUN: 12 mg/dL (ref 7–25)
CO2: 26 mmol/L (ref 20–32)
Calcium: 9.3 mg/dL (ref 8.6–10.2)
Chloride: 105 mmol/L (ref 98–110)
Creat: 0.93 mg/dL (ref 0.50–0.99)
Globulin: 2.6 g/dL (ref 1.9–3.7)
Glucose, Bld: 84 mg/dL (ref 65–99)
Potassium: 4.3 mmol/L (ref 3.5–5.3)
Sodium: 138 mmol/L (ref 135–146)
Total Bilirubin: 0.5 mg/dL (ref 0.2–1.2)
Total Protein: 6.7 g/dL (ref 6.1–8.1)
eGFR: 75 mL/min/1.73m2 (ref >=60)

## 2024-06-25 LAB — CBC
HCT: 38.6 % (ref 35.0–45.0)
Hemoglobin: 12.5 g/dL (ref 11.7–15.5)
MCH: 28.5 pg (ref 27.0–33.0)
MCHC: 32.4 g/dL (ref 32.0–36.0)
MCV: 88.1 fL (ref 80.0–100.0)
MPV: 10 fL (ref 7.5–12.5)
Platelets: 275 Thousand/uL (ref 140–400)
RBC: 4.38 Million/uL (ref 3.80–5.10)
RDW: 13.1 % (ref 11.0–15.0)
WBC: 10.3 Thousand/uL (ref 3.8–10.8)

## 2024-06-25 LAB — LIPID PANEL
Cholesterol: 122 mg/dL (ref <200)
HDL: 33 mg/dL — ABNORMAL LOW (ref >=50)
LDL Cholesterol (Calc): 57 mg/dL
Non-HDL Cholesterol (Calc): 89 mg/dL (ref <130)
Total CHOL/HDL Ratio: 3.7 (calc) (ref <5.0)
Triglycerides: 271 mg/dL — ABNORMAL HIGH (ref <150)

## 2024-06-25 LAB — HEMOGLOBIN A1C
Hgb A1c MFr Bld: 5.7 % — ABNORMAL HIGH (ref <5.7)
Mean Plasma Glucose: 117 mg/dL
eAG (mmol/L): 6.5 mmol/L

## 2024-06-25 LAB — TSH: TSH: 2.65 m[IU]/L

## 2024-06-25 LAB — VITAMIN D 25 HYDROXY (VIT D DEFICIENCY, FRACTURES): Vit D, 25-Hydroxy: 47 ng/mL (ref 30–100)

## 2024-06-26 LAB — CYTOLOGY - PAP
Comment: NEGATIVE
Diagnosis: NEGATIVE
High risk HPV: NEGATIVE

## 2024-06-26 MED ORDER — EZETIMIBE 10 MG PO TABS: 10.0000 mg | ORAL_TABLET | Freq: Every day | ORAL | 1 refills | Status: AC

## 2024-06-29 ENCOUNTER — Other Ambulatory Visit: Payer: Self-pay | Admitting: Internal Medicine

## 2024-06-30 NOTE — Telephone Encounter (Signed)
 Requested Prescriptions  Pending Prescriptions Disp Refills   citalopram  (CELEXA ) 20 MG tablet [Pharmacy Med Name: CITALOPRAM  20MG  TABLETS] 90 tablet 1    Sig: TAKE 1 TABLET(20 MG) BY MOUTH DAILY     Psychiatry:  Antidepressants - SSRI Passed - 06/30/2024  2:14 PM      Passed - Completed PHQ-2 or PHQ-9 in the last 360 days      Passed - Valid encounter within last 6 months    Recent Outpatient Visits           6 days ago Encounter for general adult medical examination with abnormal findings   [place] [place], Angeline ORN, NP   6 months ago Mixed hyperlipidemia   [facility], Angeline ORN, [place]

## 2024-07-25 ENCOUNTER — Encounter: Payer: Self-pay | Admitting: Internal Medicine

## 2024-07-25 DIAGNOSIS — I10 Essential (primary) hypertension: Secondary | ICD-10-CM

## 2024-07-27 MED ORDER — LOSARTAN POTASSIUM 100 MG PO TABS
ORAL_TABLET | ORAL | 0 refills | Status: DC
Start: 2024-07-27 — End: 2024-09-21

## 2024-09-08 ENCOUNTER — Ambulatory Visit
Admission: RE | Admit: 2024-09-08 | Discharge: 2024-09-08 | Disposition: A | Discharge status: Home / Self Care | Source: Ambulatory Visit | Attending: Internal Medicine | Admitting: Internal Medicine

## 2024-09-08 DIAGNOSIS — Z1231 Encounter for screening mammogram for malignant neoplasm of breast: Secondary | ICD-10-CM | POA: Diagnosis not present

## 2024-09-19 ENCOUNTER — Other Ambulatory Visit: Payer: Self-pay | Admitting: Internal Medicine

## 2024-09-19 DIAGNOSIS — I10 Essential (primary) hypertension: Secondary | ICD-10-CM

## 2024-09-21 NOTE — Telephone Encounter (Signed)
 Requested Prescriptions  Pending Prescriptions Disp Refills   losartan  (COZAAR ) 100 MG tablet [Pharmacy Med Name: LOSARTAN  100MG  TABLETS] 90 tablet 0    Sig: TAKE 1 TABLET(100 MG) BY MOUTH DAILY     Cardiovascular:  Angiotensin Receptor Blockers Passed - 09/21/2024  1:01 PM      Passed - Cr in normal range and within 180 days    Creat  Date Value Ref Range Status  06/24/2024 0.93 0.50 - 0.99 mg/dL Final         Passed - K in normal range and within 180 days    Potassium  Date Value Ref Range Status  06/24/2024 4.3 3.5 - 5.3 mmol/L Final         Passed - Patient is not pregnant      Passed - Last BP in normal range    BP Readings from Last 1 Encounters:  06/24/24 118/74         Passed - Valid encounter within last 6 months    Recent Outpatient Visits           2 months ago Encounter for general adult medical examination with abnormal findings   [place] [facility] [place], Angeline ORN, NP   8 months ago Mixed hyperlipidemia   [facility] [place], Angeline ORN, [place]

## 2024-11-03 ENCOUNTER — Other Ambulatory Visit: Payer: Self-pay

## 2024-11-03 ENCOUNTER — Ambulatory Visit: Admitting: Family Medicine

## 2024-11-03 VITALS — BP 158/80 | HR 94 | Ht 64.0 in | Wt 196.6 lb

## 2024-11-03 DIAGNOSIS — M79671 Pain in right foot: Secondary | ICD-10-CM

## 2024-11-03 NOTE — Patient Instructions (Addendum)
 Thank you for coming in today.   Heel cushion.   Please work on the home exercises the athletic trainer went over with you:  View at [email or web address] code [email or web address]  Plantar fascia night splints  Ice massage  Work note provided  Check back if not getting better

## 2024-11-03 NOTE — Progress Notes (Unsigned)
"       ° °  I, Claretha Schimke am a scribe for Dr. Artist Lloyd, MD.  Katherine Foster is a 49 y.o. female who presents to [facility] today for R foot pain x since June/ July 2025. Pt locates pain to bottom of the foot and on the right side. The middle of the foot on the bottom is the worst part. It hurts when she puts pressure on that foot. Cause is unknown. If she starts walking for a while the pain is tolerable.  R foot swelling: no Aggravates: putting pressure on it, standing on it Treatments tried: nothing, good feet inserts  Pertinent review of systems: No fevers or chills  Relevant historical information: Hypertension.  Arthritis.   Exam:  BP (!) 158/80   Pulse 94   Ht 5' 4 (1.626 m)   Wt 196 lb 9.6 oz (89.2 kg)   SpO2 98%   BMI 33.75 kg/m  General: Well Developed, well nourished, and in no acute distress.   MSK: Right foot normal-appearing Tender palpation plantar calcaneus.  Normal foot and ankle motion.  Intact strength.    Lab and Radiology Results No results found for this or any previous visit (from the past 72 hours). No results found.     Assessment and Plan: 49 y.o. female with right plantar fasciitis.  Plan to treat with eccentric exercises heel cushion ice massage night splint.  Additionally letter written to allow her to wear comfortable shoes at work.  Recheck if not improved.   PDMP not reviewed this encounter. No orders of the defined types were placed in this encounter.  No orders of the defined types were placed in this encounter.    Discussed warning signs or symptoms. Please see discharge instructions. Patient expresses understanding.   The above documentation has been reviewed and is accurate and complete Artist Lloyd, M.D.   "

## 2024-12-24 ENCOUNTER — Ambulatory Visit: Admitting: Internal Medicine
